# Patient Record
Sex: Male | Born: 1955
Health system: Northeastern US, Academic
[De-identification: ages and names within clinical notes are randomized; demographics above are authoritative.]

## PROBLEM LIST (undated history)

## (undated) DIAGNOSIS — K922 Gastrointestinal hemorrhage, unspecified: Secondary | ICD-10-CM

## (undated) DIAGNOSIS — R1084 Generalized abdominal pain: Secondary | ICD-10-CM

## (undated) DIAGNOSIS — K920 Hematemesis: Secondary | ICD-10-CM

## (undated) DIAGNOSIS — G629 Polyneuropathy, unspecified: Secondary | ICD-10-CM

## (undated) DIAGNOSIS — R42 Dizziness and giddiness: Secondary | ICD-10-CM

## (undated) DIAGNOSIS — I10 Essential (primary) hypertension: Secondary | ICD-10-CM

## (undated) DIAGNOSIS — K7460 Unspecified cirrhosis of liver: Secondary | ICD-10-CM

## (undated) DIAGNOSIS — R188 Other ascites: Secondary | ICD-10-CM

## (undated) DIAGNOSIS — K219 Gastro-esophageal reflux disease without esophagitis: Secondary | ICD-10-CM

## (undated) DIAGNOSIS — B1920 Unspecified viral hepatitis C without hepatic coma: Secondary | ICD-10-CM

## (undated) DIAGNOSIS — K652 Spontaneous bacterial peritonitis: Secondary | ICD-10-CM

## (undated) DIAGNOSIS — M4800 Spinal stenosis, site unspecified: Secondary | ICD-10-CM

## (undated) HISTORY — DX: Spontaneous bacterial peritonitis: K65.2

## (undated) HISTORY — PX: HAND SURGERY: SHX662

## (undated) HISTORY — DX: Hematemesis: K92.0

## (undated) HISTORY — PX: BACK SURGERY: SHX140

## (undated) HISTORY — DX: Gastrointestinal hemorrhage, unspecified: K92.2

## (undated) HISTORY — DX: Dizziness and giddiness: R42

## (undated) HISTORY — DX: Other ascites: R18.8

## (undated) HISTORY — PX: PARACENTESIS: SHX844

## (undated) HISTORY — DX: Generalized abdominal pain: R10.84

---

## 2016-08-18 ENCOUNTER — Emergency Department
Admission: EM | Admit: 2016-08-18 | Discharge: 2016-08-18 | Disposition: A | Discharge status: Home / Self Care | Payer: Self-pay | Attending: Emergency Medicine | Admitting: Emergency Medicine

## 2016-08-18 ENCOUNTER — Encounter: Payer: Self-pay | Admitting: Emergency Medicine

## 2016-08-18 ENCOUNTER — Emergency Department: Payer: Self-pay

## 2016-08-18 DIAGNOSIS — K7460 Unspecified cirrhosis of liver: Secondary | ICD-10-CM

## 2016-08-18 DIAGNOSIS — R188 Other ascites: Secondary | ICD-10-CM

## 2016-08-18 DIAGNOSIS — K7031 Alcoholic cirrhosis of liver with ascites: Secondary | ICD-10-CM | POA: Insufficient documentation

## 2016-08-18 DIAGNOSIS — Z87891 Personal history of nicotine dependence: Secondary | ICD-10-CM | POA: Insufficient documentation

## 2016-08-18 HISTORY — DX: Gastro-esophageal reflux disease without esophagitis: K21.9

## 2016-08-18 HISTORY — DX: Polyneuropathy, unspecified: G62.9

## 2016-08-18 HISTORY — DX: Spinal stenosis, site unspecified: M48.00

## 2016-08-18 LAB — URINALYSIS COMPLETE WITH MICROSCOPIC (ARMC ONLY)
BACTERIA UA: NONE SEEN
Glucose, UA: NEGATIVE mg/dL
HGB URINE DIPSTICK: NEGATIVE
Leukocytes, UA: NEGATIVE
NITRITE: NEGATIVE
PH: 6 (ref 5.0–8.0)
PROTEIN: 30 mg/dL — AB
RBC / HPF: NONE SEEN RBC/hpf (ref 0–5)
SPECIFIC GRAVITY, URINE: 1.017 (ref 1.005–1.030)

## 2016-08-18 LAB — HEPATIC FUNCTION PANEL
ALK PHOS: 75 U/L (ref 38–126)
ALT: 59 U/L (ref 17–63)
AST: 177 U/L — ABNORMAL HIGH (ref 15–41)
Albumin: 3.2 g/dL — ABNORMAL LOW (ref 3.5–5.0)
BILIRUBIN DIRECT: 1 mg/dL — AB (ref 0.1–0.5)
BILIRUBIN INDIRECT: 1.4 mg/dL — AB (ref 0.3–0.9)
TOTAL PROTEIN: 7.2 g/dL (ref 6.5–8.1)
Total Bilirubin: 2.4 mg/dL — ABNORMAL HIGH (ref 0.3–1.2)

## 2016-08-18 LAB — BASIC METABOLIC PANEL
Anion gap: 10 (ref 5–15)
CALCIUM: 7.8 mg/dL — AB (ref 8.9–10.3)
CO2: 23 mmol/L (ref 22–32)
CREATININE: 0.59 mg/dL — AB (ref 0.61–1.24)
Chloride: 105 mmol/L (ref 101–111)
GFR calc non Af Amer: >60 mL/min (ref >60)
Glucose, Bld: 117 mg/dL — ABNORMAL HIGH (ref 65–99)
Potassium: 3.8 mmol/L (ref 3.5–5.1)
SODIUM: 138 mmol/L (ref 135–145)

## 2016-08-18 LAB — CBC WITH DIFFERENTIAL/PLATELET
BASOS ABS: 0 10*3/uL (ref 0–0.1)
BASOS PCT: 0 %
EOS ABS: 0.1 10*3/uL (ref 0–0.7)
EOS PCT: 2 %
HCT: 41.8 % (ref 40.0–52.0)
Hemoglobin: 14.2 g/dL (ref 13.0–18.0)
Lymphocytes Relative: 35 %
Lymphs Abs: 1.1 10*3/uL (ref 1.0–3.6)
MCH: 34.2 pg — ABNORMAL HIGH (ref 26.0–34.0)
MCHC: 34 g/dL (ref 32.0–36.0)
MCV: 100.5 fL — ABNORMAL HIGH (ref 80.0–100.0)
MONO ABS: 0.3 10*3/uL (ref 0.2–1.0)
Monocytes Relative: 11 %
NEUTROS ABS: 1.6 10*3/uL (ref 1.4–6.5)
Neutrophils Relative %: 52 %
PLATELETS: 71 10*3/uL — AB (ref 150–440)
RBC: 4.16 MIL/uL — ABNORMAL LOW (ref 4.40–5.90)
RDW: 14.4 % (ref 11.5–14.5)
WBC: 3.1 10*3/uL — ABNORMAL LOW (ref 3.8–10.6)

## 2016-08-18 LAB — TROPONIN I

## 2016-08-18 LAB — LIPASE, BLOOD: LIPASE: 44 U/L (ref 11–51)

## 2016-08-18 MED ORDER — IOPAMIDOL (ISOVUE-300) INJECTION 61%
100.0000 mL | Freq: Once | INTRAVENOUS | Status: AC | PRN
  Administered 2016-08-18: 100 mL via INTRAVENOUS

## 2016-08-18 MED ORDER — MORPHINE SULFATE (PF) 2 MG/ML IV SOLN
4.0000 mg | Freq: Once | INTRAVENOUS | Status: AC
  Administered 2016-08-18: 4 mg via INTRAVENOUS
  Filled 2016-08-18: qty 2

## 2016-08-18 MED ORDER — ONDANSETRON HCL 4 MG/2ML IJ SOLN
4.0000 mg | Freq: Once | INTRAMUSCULAR | Status: AC
  Administered 2016-08-18: 4 mg via INTRAVENOUS
  Filled 2016-08-18: qty 2

## 2016-08-18 MED ORDER — IOPAMIDOL (ISOVUE-300) INJECTION 61%
30.0000 mL | Freq: Once | INTRAVENOUS | Status: AC | PRN
  Administered 2016-08-18: 30 mL via ORAL

## 2016-08-18 NOTE — ED Provider Notes (Signed)
[facility] Emergency Department Provider Note  Time seen: 4:36 PM  I have reviewed the triage vital signs and the nursing notes.   HISTORY  Chief Complaint Bloated and Flank Pain    HPI Antonio Ryan is a 60 y.o. male witha past medical history of gastric reflux, spinal stenosis, who presents the emergency department with abdominal swelling and pain. According to the patient for the past one week he has been experiencing abdominal swelling. He states the abdomen feels very tight and uncomfortable. States nausea but denies vomiting. States a smaller than normal bowel movement over the past several days including this morning. Denies diarrhea. Denies fever. Patient does admit to daily alcohol use 4-5 glasses of wine per day. Denies any issues with his liver in the past.  Past Medical History:  Diagnosis Date  . Acid reflux   . Neuropathy (HCC)   . Spinal stenosis     There are no active problems to display for this patient.   Past Surgical History:  Procedure Laterality Date  . BACK SURGERY      Prior to Admission medications   Not on File    No Known Allergies  No family history on file.  Social History Social History  Substance Use Topics  . Smoking status: Former Games developermoker  . Smokeless tobacco: Never Used  . Alcohol use 2.4 oz/week    4 Glasses of wine per week    Review of Systems Constitutional: Negative for fever Cardiovascular: Negative for chest pain. Respiratory: Negative for shortness of breath. Gastrointestinal: Positive for abdominal discomfort, mild. Positive for abdominal swelling. Positive for nausea. Negative for vomiting or diarrhea. Genitourinary: Negative for dysuria. Neurological: Negative for headache 10-point ROS otherwise negative.  ____________________________________________   PHYSICAL EXAM:  VITAL SIGNS: ED Triage Vitals [08/18/16 1348]  Enc Vitals Group     BP (!) 157/80     Pulse Rate (!) 103   Resp 18     Temp 97.9 F (36.6 C)     Temp Source Oral     SpO2 94 %     Weight 230 lb (104.3 kg)     Height 6\' 1"  (1.854 m)     Head Circumference      Peak Flow      Pain Score 10     Pain Loc      Pain Edu?      Excl. in GC?     Constitutional: Alert and oriented. Well appearing and in no distress. Eyes: Slight scleral icterus. ENT   Head: Normocephalic and atraumatic.   Mouth/Throat: Mucous membranes are moist. Cardiovascular: Normal rate, regular rhythm. No murmur Respiratory: Normal respiratory effort without tachypnea nor retractions. Breath sounds are clear  Gastrointestinal: Soft, but distended. Mostly dull percussion. Mild diffuse abdominal tenderness, no rebound or guarding. No signs of peritonitis. Musculoskeletal: Nontender with normal range of motion in all extremities. Mild to moderate pedal edema bilaterally. Neurologic:  Normal speech and language. No gross focal neurologic deficits  Skin:  Skin is warm, dry and intact.  Psychiatric: Mood and affect are normal. Speech and behavior are normal.       RADIOLOGY  CT shows hepatic cirrhosis, large amount of ascites.  ____________________________________________   INITIAL IMPRESSION / ASSESSMENT AND PLAN / ED COURSE  Pertinent labs & imaging results that were available during my care of the patient were reviewed by me and considered in my medical decision making (see chart for details).  The patient presents the emergency  department with 1 week of abdominal swelling and discomfort. Patient does admit to daily alcohol use. Percussion is mostly dull raising concern for possible liver dysfunction due to alcohol use. Patient does state abdominal discomfort mostly on the right side. We'll obtain a CT scan of the abdomen. I added on liver function tests to further evaluate. Patient is agreeable to this plan.  CT shows hepatic cirrhosis with large amount of ascites. Liver function tests show mild hepatic  dysfunction with an elevated bilirubin. I discussed with the patient the need to follow up with a primary care doctor as soon as possible to discuss further workup including hepatology. I discussed the importance of stopping alcohol intake. Patient is agreeable.  ____________________________________________   FINAL CLINICAL IMPRESSION(S) / ED DIAGNOSES  Abdominal swelling. Abdominal pain. Liver dysfunction   Minna AntisKevin Jalaila Caradonna, MD 08/18/16 1901

## 2016-08-18 NOTE — ED Triage Notes (Signed)
Patient presents to ED via POV with c/o right flank pain and abdominal distention. Patient also c/o nausea but states he has been able to eat just fine. LBM yesterday. Patient states urine is dark. Abdomen is very distended, patient states he drinks 4 glasses of wine a day. Patient eyes are jaundiced.

## 2016-08-18 NOTE — Discharge Instructions (Signed)
As we have discussed your workup today shows a large amount of ascites which is concerning for liver dysfunction. It is very important he follow-up with her primary care doctor this week to discuss further workup. It is also extremely important that you stop using alcohol. Return to the emergency department for any worsening pain, fever, or any other personally concerning symptoms.

## 2016-09-09 ENCOUNTER — Emergency Department
Admission: EM | Admit: 2016-09-09 | Discharge: 2016-09-09 | Disposition: A | Discharge status: Home / Self Care | Payer: Medicaid Other | Attending: Emergency Medicine | Admitting: Emergency Medicine

## 2016-09-09 DIAGNOSIS — Z87891 Personal history of nicotine dependence: Secondary | ICD-10-CM | POA: Insufficient documentation

## 2016-09-09 DIAGNOSIS — R140 Abdominal distension (gaseous): Secondary | ICD-10-CM

## 2016-09-09 DIAGNOSIS — Z79899 Other long term (current) drug therapy: Secondary | ICD-10-CM | POA: Diagnosis not present

## 2016-09-09 DIAGNOSIS — K7031 Alcoholic cirrhosis of liver with ascites: Secondary | ICD-10-CM | POA: Insufficient documentation

## 2016-09-09 DIAGNOSIS — Z791 Long term (current) use of non-steroidal anti-inflammatories (NSAID): Secondary | ICD-10-CM | POA: Diagnosis not present

## 2016-09-09 LAB — AMYLASE: AMYLASE: 34 U/L (ref 28–100)

## 2016-09-09 LAB — COMPREHENSIVE METABOLIC PANEL
ALBUMIN: 3.4 g/dL — AB (ref 3.5–5.0)
ALT: 44 U/L (ref 17–63)
ANION GAP: 7 (ref 5–15)
AST: 84 U/L — ABNORMAL HIGH (ref 15–41)
Alkaline Phosphatase: 59 U/L (ref 38–126)
BUN: 8 mg/dL (ref 6–20)
CHLORIDE: 102 mmol/L (ref 101–111)
CO2: 26 mmol/L (ref 22–32)
Calcium: 8.6 mg/dL — ABNORMAL LOW (ref 8.9–10.3)
Creatinine, Ser: 0.79 mg/dL (ref 0.61–1.24)
GFR calc non Af Amer: >60 mL/min (ref >60)
GLUCOSE: 106 mg/dL — AB (ref 65–99)
Potassium: 4.6 mmol/L (ref 3.5–5.1)
SODIUM: 135 mmol/L (ref 135–145)
Total Bilirubin: 3.7 mg/dL — ABNORMAL HIGH (ref 0.3–1.2)
Total Protein: 7.7 g/dL (ref 6.5–8.1)

## 2016-09-09 LAB — CBC
HCT: 47.3 % (ref 40.0–52.0)
HEMOGLOBIN: 15.7 g/dL (ref 13.0–18.0)
MCH: 33.5 pg (ref 26.0–34.0)
MCHC: 33.3 g/dL (ref 32.0–36.0)
MCV: 100.9 fL — AB (ref 80.0–100.0)
Platelets: 133 10*3/uL — ABNORMAL LOW (ref 150–440)
RBC: 4.69 MIL/uL (ref 4.40–5.90)
RDW: 14.5 % (ref 11.5–14.5)
WBC: 5.9 10*3/uL (ref 3.8–10.6)

## 2016-09-09 LAB — GLUCOSE, PERITONEAL FLUID: Glucose, Peritoneal Fluid: 113 mg/dL

## 2016-09-09 LAB — LACTATE DEHYDROGENASE, PLEURAL OR PERITONEAL FLUID: LD FL: 68 U/L — AB (ref 3–23)

## 2016-09-09 LAB — PROTEIN, BODY FLUID: Total protein, fluid: <3 g/dL

## 2016-09-09 LAB — SPECIFIC GRAVITY: SPECIFIC GRAVITY: 1.015

## 2016-09-09 LAB — LIPASE, BLOOD: LIPASE: 43 U/L (ref 11–51)

## 2016-09-09 MED ORDER — LIDOCAINE-EPINEPHRINE (PF) 1 %-1:200000 IJ SOLN
INTRAMUSCULAR | Status: AC
  Administered 2016-09-09: 30 mL via INTRADERMAL
  Filled 2016-09-09: qty 30

## 2016-09-09 MED ORDER — LIDOCAINE-EPINEPHRINE (PF) 1 %-1:200000 IJ SOLN
30.0000 mL | Freq: Once | INTRAMUSCULAR | Status: AC
  Administered 2016-09-09: 30 mL via INTRADERMAL

## 2016-09-09 MED ORDER — ONDANSETRON HCL 4 MG/2ML IJ SOLN
INTRAMUSCULAR | Status: AC
  Filled 2016-09-09: qty 2

## 2016-09-09 MED ORDER — CEPHALEXIN 500 MG PO CAPS: 500.0000 mg | ORAL_CAPSULE | Freq: Three times a day (TID) | ORAL | 0 refills | Status: DC

## 2016-09-09 NOTE — ED Triage Notes (Signed)
Pt states that he was recently diagnosed with cirrhosis, pt states that he was told to return if the swelling in his abd got worse, pt reports that his swelling is worse in his abd

## 2016-09-09 NOTE — ED Provider Notes (Signed)
[facility] Emergency Department Provider Note  Time seen: 6:17 PM  I have reviewed the triage vital signs and the nursing notes.   HISTORY  Chief Complaint Abdominal Pain    HPI Antonio BrownsDonald Manter is a 60 y.o. male with a past medical history of alcohol abuse, who presents the emergency department with significant abdominal distention. 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 Patient states he called GI medicine to arrange a follow-up appointment but the earliest appointment is next month in December. He states over the past 2 weeks his abdomen has continued to swell. He states it is so tight that it has become painful, hard for him to breathe. Denies any fever. Denies any tenderness on pushing on his abdomen. Patient states it feels extremely tight and he has not been able to sleep due to the discomfort.  Past Medical History:  Diagnosis Date  . Acid reflux   . Neuropathy (HCC)   . Spinal stenosis     There are no active problems to display for this patient.   Past Surgical History:  Procedure Laterality Date  . BACK SURGERY      Prior to Admission medications   Not on File    No Known Allergies  No family history on file.  Social History Social History  Substance Use Topics  . Smoking status: Former Games developermoker  . Smokeless tobacco: Never Used  . Alcohol use 2.4 oz/week    4 Glasses of wine per week    Review of Systems Constitutional: Negative for fever Cardiovascular: Negative for chest pain. Respiratory: Negative for shortness of breath. Gastrointestinal: Positive for abdominal swelling and tightness. Genitourinary: Negative for dysuria. Musculoskeletal: Negative for back pain. Neurological: Negative for headache 10-point ROS otherwise  negative.  ____________________________________________   PHYSICAL EXAM:  VITAL SIGNS: ED Triage Vitals  Enc Vitals Group     BP 09/09/16 1713 (!) 158/89     Pulse Rate 09/09/16 1713 97     Resp 09/09/16 1713 18     Temp 09/09/16 1713 97.6 F (36.4 C)     Temp Source 09/09/16 1713 Oral     SpO2 09/09/16 1713 98 %     Weight 09/09/16 1714 231 lb 11.2 oz (105.1 kg)     Height 09/09/16 1714 6\' 1"  (1.854 m)     Head Circumference --      Peak Flow --      Pain Score --      Pain Loc --      Pain Edu? --      Excl. in GC? --     Constitutional: Alert and oriented. Well appearing and in no distress. Eyes: Normal exam ENT   Head: Normocephalic and atraumatic   Mouth/Throat: Mucous membranes are moist. Cardiovascular: Normal rate, regular rhythm. No murmur Respiratory: Normal respiratory effort without tachypnea nor retractions. Breath sounds are clear Gastrointestinal: Significantly distended abdomen. Nontender to palpation. Dull percussion. Musculoskeletal: Nontender with normal range of motion in all extremities.  Neurologic:  Normal speech and language. No gross focal neurologic deficits Skin:  Skin is warm, dry and intact.  Psychiatric: Mood and affect are normal.   ____________________________________________   INITIAL IMPRESSION / ASSESSMENT AND PLAN / ED COURSE  Pertinent labs & imaging results that were available during my care of the patient were reviewed by me and considered in my medical  decision making (see chart for details).  Patient presents the emergency department with a significantly distended abdomen hoping to have fluid drained. Patient has never had fluid drained from his abdomen in the past. It has become so tight it is uncomfortable for him, he is having trouble breathing, states he cannot sleep due to the abdominal tightness. No interventional radiology currently as it is a Sunday evening available. We will attempt therapeutic paracentesis in the  emergency department. Central Venous Catheter Insertion Procedure Note Antonio BrownsDonald Ryan [id number] 04/21/1956  Procedure: Paracentesis Indications: Abdominal distention  Procedure Details Consent: Risks of procedure as well as the alternatives and risks of each were explained to the (patient/caregiver).  Consent for procedure obtained.  Time Out: Verified patient identification, verified procedure, site/side was marked, verified correct patient position, special equipment/implants available, medications/allergies/relevent history reviewed, required imaging and test results available.  Performed  Maximum sterile technique was used including antiseptics, gown, gloves, sterile covers. Skin prep: Chlorhexidine; local anesthetic administered Local lidocaine with epinephrine (1%)  Evaluation Yellow fluid drained. Catheter inserted for drainage.  Total of 2700mL drained.  Complications: No apparent complications Patient did tolerate procedure well.  Minna AntisDUCHOWSKI, Hadli Vandemark 09/09/2016, 8:56 PM   Paracentesis performed by myself. 2700 cc of yellow ascitic fluid removed. Labs have been sent on the fluid. Patient tolerated the procedure very well. No complications. Patient will be discharged home. As a secondary complaint the patient also has an area of tenderness or redness in front of his left ear. No abscess, but likely cellulitis versus early abscess. Nothing to drain at this time. We'll discharge him a short course of Keflex. Patient agreeable to plan. Patient will follow up with GI medicine in approximately 2 weeks. __________________________________________   FINAL CLINICAL IMPRESSION(S) / ED DIAGNOSES  ascities cellulitis    Minna AntisKevin Annabella Elford, MD 09/09/16 2100

## 2016-09-09 NOTE — ED Notes (Addendum)
Initial return of 30 mls

## 2016-09-09 NOTE — Discharge Instructions (Signed)
A paracentesis has been performed in the emergency department today (fluid drained from your abdomen). 2700 mL's of fluid has been removed. He'll be prescribed an antibiotic for your left ear cellulitis/infection. Please take her to entire course. Please follow-up with GI medicine as soon as possible. Return to the emergency department for any abdominal pain, fever, or any other symptom personally concerning to yourself.

## 2016-09-09 NOTE — ED Notes (Signed)
Procedure time out att

## 2016-09-10 NOTE — ED Provider Notes (Signed)
Mildly elevated LDH noted by nurse on lab review. Patient without ttp in note, normal WBC. No further work up indicated at this time   Jene Everyobert Matai Carpenito, MD 09/10/16 1137

## 2016-09-11 LAB — MISC LABCORP TEST (SEND OUT): Labcorp test code: 19588

## 2016-09-13 LAB — BODY FLUID CULTURE
Culture: NO GROWTH
GRAM STAIN: NONE SEEN

## 2016-09-26 ENCOUNTER — Encounter: Payer: Self-pay | Admitting: Emergency Medicine

## 2016-09-26 ENCOUNTER — Inpatient Hospital Stay
Admission: EM | Admit: 2016-09-26 | Discharge: 2016-10-03 | DRG: 432 | Disposition: A | Discharge status: Home / Self Care | Payer: Medicaid Other | Attending: Internal Medicine | Admitting: Internal Medicine

## 2016-09-26 DIAGNOSIS — K922 Gastrointestinal hemorrhage, unspecified: Secondary | ICD-10-CM | POA: Diagnosis present

## 2016-09-26 DIAGNOSIS — D72819 Decreased white blood cell count, unspecified: Secondary | ICD-10-CM | POA: Diagnosis not present

## 2016-09-26 DIAGNOSIS — R109 Unspecified abdominal pain: Secondary | ICD-10-CM

## 2016-09-26 DIAGNOSIS — Z23 Encounter for immunization: Secondary | ICD-10-CM

## 2016-09-26 DIAGNOSIS — K652 Spontaneous bacterial peritonitis: Secondary | ICD-10-CM

## 2016-09-26 DIAGNOSIS — K7031 Alcoholic cirrhosis of liver with ascites: Principal | ICD-10-CM

## 2016-09-26 DIAGNOSIS — M4800 Spinal stenosis, site unspecified: Secondary | ICD-10-CM | POA: Diagnosis present

## 2016-09-26 DIAGNOSIS — Z79899 Other long term (current) drug therapy: Secondary | ICD-10-CM

## 2016-09-26 DIAGNOSIS — G629 Polyneuropathy, unspecified: Secondary | ICD-10-CM | POA: Diagnosis present

## 2016-09-26 DIAGNOSIS — R1084 Generalized abdominal pain: Secondary | ICD-10-CM

## 2016-09-26 DIAGNOSIS — D696 Thrombocytopenia, unspecified: Secondary | ICD-10-CM | POA: Diagnosis present

## 2016-09-26 DIAGNOSIS — K219 Gastro-esophageal reflux disease without esophagitis: Secondary | ICD-10-CM | POA: Diagnosis present

## 2016-09-26 DIAGNOSIS — I10 Essential (primary) hypertension: Secondary | ICD-10-CM | POA: Diagnosis present

## 2016-09-26 DIAGNOSIS — Z87891 Personal history of nicotine dependence: Secondary | ICD-10-CM

## 2016-09-26 DIAGNOSIS — B182 Chronic viral hepatitis C: Secondary | ICD-10-CM | POA: Diagnosis present

## 2016-09-26 DIAGNOSIS — D684 Acquired coagulation factor deficiency: Secondary | ICD-10-CM | POA: Diagnosis present

## 2016-09-26 DIAGNOSIS — F1020 Alcohol dependence, uncomplicated: Secondary | ICD-10-CM | POA: Diagnosis present

## 2016-09-26 DIAGNOSIS — M6281 Muscle weakness (generalized): Secondary | ICD-10-CM

## 2016-09-26 DIAGNOSIS — K920 Hematemesis: Secondary | ICD-10-CM | POA: Diagnosis present

## 2016-09-26 DIAGNOSIS — K5900 Constipation, unspecified: Secondary | ICD-10-CM | POA: Diagnosis not present

## 2016-09-26 DIAGNOSIS — R609 Edema, unspecified: Secondary | ICD-10-CM

## 2016-09-26 HISTORY — DX: Essential (primary) hypertension: I10

## 2016-09-26 HISTORY — DX: Unspecified viral hepatitis C without hepatic coma: B19.20

## 2016-09-26 HISTORY — DX: Unspecified cirrhosis of liver: K74.60

## 2016-09-26 LAB — CBC
HCT: 42.2 % (ref 40.0–52.0)
HEMOGLOBIN: 14.3 g/dL (ref 13.0–18.0)
MCH: 33.6 pg (ref 26.0–34.0)
MCHC: 34 g/dL (ref 32.0–36.0)
MCV: 98.9 fL (ref 80.0–100.0)
Platelets: 127 10*3/uL — ABNORMAL LOW (ref 150–440)
RBC: 4.27 MIL/uL — ABNORMAL LOW (ref 4.40–5.90)
RDW: 15.2 % — ABNORMAL HIGH (ref 11.5–14.5)
WBC: 5.5 10*3/uL (ref 3.8–10.6)

## 2016-09-26 MED ORDER — PANTOPRAZOLE SODIUM 40 MG IV SOLR
80.0000 mg | INTRAVENOUS | Status: AC
  Administered 2016-09-27: 80 mg via INTRAVENOUS
  Filled 2016-09-26: qty 80

## 2016-09-26 MED ORDER — ONDANSETRON HCL 4 MG/2ML IJ SOLN
INTRAMUSCULAR | Status: AC
  Administered 2016-09-26: 4 mg via INTRAVENOUS
  Filled 2016-09-26: qty 2

## 2016-09-26 MED ORDER — MORPHINE SULFATE (PF) 4 MG/ML IV SOLN
4.0000 mg | Freq: Once | INTRAVENOUS | Status: AC
  Administered 2016-09-26: 4 mg via INTRAVENOUS

## 2016-09-26 MED ORDER — ONDANSETRON HCL 4 MG/2ML IJ SOLN
4.0000 mg | Freq: Once | INTRAMUSCULAR | Status: AC
  Administered 2016-09-26: 4 mg via INTRAVENOUS

## 2016-09-26 MED ORDER — MORPHINE SULFATE (PF) 4 MG/ML IV SOLN
INTRAVENOUS | Status: AC
  Administered 2016-09-26: 4 mg via INTRAVENOUS
  Filled 2016-09-26: qty 1

## 2016-09-26 NOTE — ED Provider Notes (Signed)
[facility] Emergency Department Provider Note   First MD Initiated Contact with Patient 09/26/16 2338     (approximate)  I have reviewed the triage vital signs and the nursing notes.   HISTORY  Chief Complaint Abdominal Pain and Nausea    HPI Antonio Ryan is a 60 y.o. male with bullous chronic medical conditions including cirrhosis hepatitis C and alcohol abuse presents to the emergency department with generalized abdominal pain and distention that has progressively worsened since his visit to the emergency department on 09/10/2016 at which point a past and this was performed with 2700 ML's of yellow ascitic fluid obtained. Patient denies any fever but does however admit to vomiting bright red blood 2 times this week most recently yesterday. Patient denies any rectal bleeding or melena. Last EtOH ingestion tonight approximately 3 glasses of wine   Past Medical History:  Diagnosis Date  . Acid reflux   . Cirrhosis (HCC)   . Hepatitis C   . Neuropathy (HCC)   . Spinal stenosis     There are no active problems to display for this patient.   Past Surgical History:  Procedure Laterality Date  . BACK SURGERY      Prior to Admission medications   Medication Sig Start Date End Date Taking? Authorizing Provider  cephALEXin (KEFLEX) 500 MG capsule Take 1 capsule (500 mg total) by mouth 3 (three) times daily. 09/09/16   Minna AntisKevin Paduchowski, MD  ibuprofen (ADVIL,MOTRIN) 200 MG tablet Take 400-600 mg by mouth every 6 (six) hours as needed.    Historical Provider, MD  lisinopril (PRINIVIL,ZESTRIL) 10 MG tablet Take 10 mg by mouth daily.    Historical Provider, MD  omeprazole (PRILOSEC) 20 MG capsule Take 20 mg by mouth daily.    Historical Provider, MD    Allergies No known drug allergies History reviewed. No pertinent family history.  Social History Social History  Substance Use Topics  . Smoking status: Former Games developermoker  . Smokeless tobacco: Never  Used  . Alcohol use 2.4 oz/week    4 Glasses of wine per week    Review of Systems Constitutional: No fever/chills Eyes: No visual changes. ENT: No sore throat. Cardiovascular: Denies chest pain. Respiratory: Denies shortness of breath. Gastrointestinal:Positive for abdominal pain and distention  No nausea, no vomiting.  No diarrhea.  No constipation. Genitourinary: Negative for dysuria. Musculoskeletal: Negative for back pain. Skin: Negative for rash. Neurological: Negative for headaches, focal weakness or numbness.  10-point ROS otherwise negative.  ____________________________________________   PHYSICAL EXAM:  VITAL SIGNS: ED Triage Vitals  Enc Vitals Group     BP 09/26/16 2330 (!) 166/92     Pulse Rate 09/26/16 2330 88     Resp 09/26/16 2330 (!) 21     Temp 09/26/16 2330 98.7 F (37.1 C)     Temp Source 09/26/16 2330 Oral     SpO2 09/26/16 2330 98 %     Weight 09/26/16 2331 230 lb (104.3 kg)     Height 09/26/16 2331 6\' 1"  (1.854 m)     Head Circumference --      Peak Flow --      Pain Score 09/26/16 2331 10     Pain Loc --      Pain Edu? --      Excl. in GC? --    Constitutional: Alert and oriented. Apparent discomfort Eyes: Conjunctivae are normal. PERRL. EOMI. Head: Atraumatic. Ears:  Healthy appearing ear canals and TMs bilaterally Nose: No congestion/rhinnorhea.  Mouth/Throat: Mucous membranes are moist.  Oropharynx non-erythematous. Neck: No stridor.  No meningeal signs.   Cardiovascular: Normal rate, regular rhythm. Good peripheral circulation. Grossly normal heart sounds. Respiratory: Normal respiratory effort.  No retractions. Left lower lung field rales. Gastrointestinal: He was abdominal tenderness palpation markedly distended abdomen positive fluid wave Musculoskeletal: No lower extremity tenderness nor edema. No gross deformities of extremities. Neurologic:  Normal speech and language. No gross focal neurologic deficits are appreciated.  Skin:   Skin is warm, dry and intact. No rash noted. Psychiatric: Mood and affect are normal. Speech and behavior are normal.  ____________________________________________   LABS (all labs ordered are listed, but only abnormal results are displayed)  Labs Reviewed  LIPASE, BLOOD  COMPREHENSIVE METABOLIC PANEL  CBC  URINALYSIS, COMPLETE (UACMP) WITH MICROSCOPIC    RADIOLOGY  I, [place] N BROWN, personally viewed and evaluated these images (plain radiographs) as part of my medical decision making, as well as reviewing the written report by the radiologist.  No results found.  ___  Procedures      INITIAL IMPRESSION / ASSESSMENT AND PLAN / ED COURSE  Pertinent labs & imaging results that were available during my care of the patient were reviewed by me and considered in my medical decision making (see chart for details).   Given Protonix bolus 80 mg as well as Protonix infusion follow secondary to upper GI bleed. Patient discussed with Dr. Emmit PomfretHugelmeyer oscillation for further evaluation and management.  Clinical Course     ____________________________________________  FINAL CLINICAL IMPRESSION(S) / ED DIAGNOSES  Final diagnoses:  Upper GI bleed  Ascites due to alcoholic cirrhosis (HCC)     MEDICATIONS GIVEN DURING THIS VISIT:  Medications  ondansetron (ZOFRAN) injection 4 mg (4 mg Intravenous Given 09/26/16 2343)  morphine 4 MG/ML injection 4 mg (4 mg Intravenous Given 09/26/16 2343)     NEW OUTPATIENT MEDICATIONS STARTED DURING THIS VISIT:  New Prescriptions   No medications on file    Modified Medications   No medications on file    Discontinued Medications   No medications on file     Note:  This document was prepared using Dragon voice recognition software and may include unintentional dictation errors.    Darci Currentandolph N Brown, MD 09/27/16 [phone]

## 2016-09-26 NOTE — ED Triage Notes (Signed)
[facility] home c/o RUQ abd pain with distention. Pt also reports nausea and one episode of vomiting today. Pt reports that he was seen here in this ED about a month ago and had paracentesis done and had quite a bit of fluid removed from his abd. Pt has a history of hep C and was recently diagnosed with cirrhosis. Abd is distended and taught on assessment.

## 2016-09-26 NOTE — ED Notes (Signed)
Wife reports that pt was throwing up blood on Monday and pt reports that he was throwing up blood yesterday. Pt denies any frank blood in his stool or any dark or black stools.

## 2016-09-27 ENCOUNTER — Emergency Department: Payer: Medicaid Other

## 2016-09-27 ENCOUNTER — Inpatient Hospital Stay: Payer: Medicaid Other

## 2016-09-27 ENCOUNTER — Encounter: Payer: Self-pay | Admitting: Internal Medicine

## 2016-09-27 DIAGNOSIS — K920 Hematemesis: Secondary | ICD-10-CM | POA: Diagnosis present

## 2016-09-27 DIAGNOSIS — M4800 Spinal stenosis, site unspecified: Secondary | ICD-10-CM | POA: Diagnosis present

## 2016-09-27 DIAGNOSIS — Z79899 Other long term (current) drug therapy: Secondary | ICD-10-CM | POA: Diagnosis not present

## 2016-09-27 DIAGNOSIS — D696 Thrombocytopenia, unspecified: Secondary | ICD-10-CM | POA: Diagnosis present

## 2016-09-27 DIAGNOSIS — F1020 Alcohol dependence, uncomplicated: Secondary | ICD-10-CM | POA: Diagnosis present

## 2016-09-27 DIAGNOSIS — D684 Acquired coagulation factor deficiency: Secondary | ICD-10-CM | POA: Diagnosis present

## 2016-09-27 DIAGNOSIS — Z87891 Personal history of nicotine dependence: Secondary | ICD-10-CM | POA: Diagnosis not present

## 2016-09-27 DIAGNOSIS — K219 Gastro-esophageal reflux disease without esophagitis: Secondary | ICD-10-CM | POA: Diagnosis present

## 2016-09-27 DIAGNOSIS — D72819 Decreased white blood cell count, unspecified: Secondary | ICD-10-CM | POA: Diagnosis not present

## 2016-09-27 DIAGNOSIS — I10 Essential (primary) hypertension: Secondary | ICD-10-CM | POA: Diagnosis present

## 2016-09-27 DIAGNOSIS — K652 Spontaneous bacterial peritonitis: Secondary | ICD-10-CM | POA: Diagnosis present

## 2016-09-27 DIAGNOSIS — K922 Gastrointestinal hemorrhage, unspecified: Secondary | ICD-10-CM | POA: Diagnosis present

## 2016-09-27 DIAGNOSIS — Z23 Encounter for immunization: Secondary | ICD-10-CM | POA: Diagnosis not present

## 2016-09-27 DIAGNOSIS — K7031 Alcoholic cirrhosis of liver with ascites: Secondary | ICD-10-CM | POA: Diagnosis present

## 2016-09-27 DIAGNOSIS — K5900 Constipation, unspecified: Secondary | ICD-10-CM | POA: Diagnosis not present

## 2016-09-27 DIAGNOSIS — B182 Chronic viral hepatitis C: Secondary | ICD-10-CM | POA: Diagnosis present

## 2016-09-27 DIAGNOSIS — G629 Polyneuropathy, unspecified: Secondary | ICD-10-CM | POA: Diagnosis present

## 2016-09-27 DIAGNOSIS — R109 Unspecified abdominal pain: Secondary | ICD-10-CM | POA: Diagnosis present

## 2016-09-27 HISTORY — DX: Hematemesis: K92.0

## 2016-09-27 HISTORY — DX: Gastrointestinal hemorrhage, unspecified: K92.2

## 2016-09-27 LAB — BASIC METABOLIC PANEL
ANION GAP: 8 (ref 5–15)
BUN: 7 mg/dL (ref 6–20)
CO2: 23 mmol/L (ref 22–32)
Calcium: 7.8 mg/dL — ABNORMAL LOW (ref 8.9–10.3)
Chloride: 106 mmol/L (ref 101–111)
Creatinine, Ser: 0.56 mg/dL — ABNORMAL LOW (ref 0.61–1.24)
Glucose, Bld: 109 mg/dL — ABNORMAL HIGH (ref 65–99)
POTASSIUM: 3.8 mmol/L (ref 3.5–5.1)
SODIUM: 137 mmol/L (ref 135–145)

## 2016-09-27 LAB — COMPREHENSIVE METABOLIC PANEL
ALBUMIN: 2.9 g/dL — AB (ref 3.5–5.0)
ALK PHOS: 65 U/L (ref 38–126)
ALT: 33 U/L (ref 17–63)
AST: 85 U/L — AB (ref 15–41)
Anion gap: 9 (ref 5–15)
BUN: 6 mg/dL (ref 6–20)
CALCIUM: 8 mg/dL — AB (ref 8.9–10.3)
CHLORIDE: 103 mmol/L (ref 101–111)
CO2: 25 mmol/L (ref 22–32)
CREATININE: 0.72 mg/dL (ref 0.61–1.24)
GFR calc non Af Amer: >60 mL/min (ref >60)
GLUCOSE: 116 mg/dL — AB (ref 65–99)
Potassium: 3.5 mmol/L (ref 3.5–5.1)
SODIUM: 137 mmol/L (ref 135–145)
Total Bilirubin: 2 mg/dL — ABNORMAL HIGH (ref 0.3–1.2)
Total Protein: 7 g/dL (ref 6.5–8.1)

## 2016-09-27 LAB — HEMOGLOBIN AND HEMATOCRIT, BLOOD
HCT: 38.1 % — ABNORMAL LOW (ref 40.0–52.0)
HEMATOCRIT: 40.6 % (ref 40.0–52.0)
HEMOGLOBIN: 14 g/dL (ref 13.0–18.0)
HEMOGLOBIN: 13 g/dL (ref 13.0–18.0)

## 2016-09-27 LAB — PROTIME-INR
INR: 1.54
PROTHROMBIN TIME: 18.6 s — AB (ref 11.4–15.2)

## 2016-09-27 LAB — CBC
HCT: 39.6 % — ABNORMAL LOW (ref 40.0–52.0)
HEMOGLOBIN: 13.5 g/dL (ref 13.0–18.0)
MCH: 33.4 pg (ref 26.0–34.0)
MCHC: 34.1 g/dL (ref 32.0–36.0)
MCV: 98 fL (ref 80.0–100.0)
PLATELETS: 118 10*3/uL — AB (ref 150–440)
RBC: 4.04 MIL/uL — AB (ref 4.40–5.90)
RDW: 15.3 % — ABNORMAL HIGH (ref 11.5–14.5)
WBC: 4.1 10*3/uL (ref 3.8–10.6)

## 2016-09-27 LAB — PHOSPHORUS: PHOSPHORUS: 3.9 mg/dL (ref 2.5–4.6)

## 2016-09-27 LAB — LIPASE, BLOOD: LIPASE: 30 U/L (ref 11–51)

## 2016-09-27 LAB — MAGNESIUM: Magnesium: 1.7 mg/dL (ref 1.7–2.4)

## 2016-09-27 MED ORDER — ALBUMIN HUMAN 25 % IV SOLN
25.0000 g | Freq: Once | INTRAVENOUS | Status: AC
Start: 2016-09-27 — End: 2016-09-27
  Administered 2016-09-27: 25 g via INTRAVENOUS
  Filled 2016-09-27: qty 100

## 2016-09-27 MED ORDER — ACETAMINOPHEN 325 MG PO TABS: 650.0000 mg | ORAL_TABLET | Freq: Four times a day (QID) | ORAL | Status: DC | PRN

## 2016-09-27 MED ORDER — MORPHINE SULFATE (PF) 4 MG/ML IV SOLN
4.0000 mg | Freq: Once | INTRAVENOUS | Status: AC
  Administered 2016-09-27: 4 mg via INTRAVENOUS

## 2016-09-27 MED ORDER — MORPHINE SULFATE (PF) 4 MG/ML IV SOLN
INTRAVENOUS | Status: AC
  Administered 2016-09-27: 4 mg via INTRAVENOUS
  Filled 2016-09-27: qty 1

## 2016-09-27 MED ORDER — INFLUENZA VAC SPLIT QUAD 0.5 ML IM SUSY
0.5000 mL | PREFILLED_SYRINGE | INTRAMUSCULAR | Status: AC
  Administered 2016-09-29: 0.5 mL via INTRAMUSCULAR
  Filled 2016-09-27: qty 0.5

## 2016-09-27 MED ORDER — SODIUM CHLORIDE 0.9% FLUSH: 3.0000 mL | INTRAVENOUS | Status: DC | PRN

## 2016-09-27 MED ORDER — VITAMIN B-1 100 MG PO TABS
100.0000 mg | ORAL_TABLET | Freq: Every day | ORAL | Status: DC
  Administered 2016-09-28 – 2016-10-03 (×6): 100 mg via ORAL
  Filled 2016-09-27 (×6): qty 1

## 2016-09-27 MED ORDER — SODIUM CHLORIDE 0.9% FLUSH
3.0000 mL | Freq: Two times a day (BID) | INTRAVENOUS | Status: DC
  Administered 2016-09-27 – 2016-10-02 (×13): 3 mL via INTRAVENOUS

## 2016-09-27 MED ORDER — ONDANSETRON HCL 4 MG/2ML IJ SOLN: 4.0000 mg | Freq: Four times a day (QID) | INTRAMUSCULAR | Status: DC | PRN

## 2016-09-27 MED ORDER — ONDANSETRON HCL 4 MG PO TABS: 4.0000 mg | ORAL_TABLET | Freq: Four times a day (QID) | ORAL | Status: DC | PRN

## 2016-09-27 MED ORDER — BISACODYL 5 MG PO TBEC
5.0000 mg | DELAYED_RELEASE_TABLET | Freq: Every day | ORAL | Status: DC | PRN
  Administered 2016-09-28: 5 mg via ORAL
  Filled 2016-09-27: qty 1

## 2016-09-27 MED ORDER — SODIUM CHLORIDE 0.9 % IV SOLN: 250.0000 mL | INTRAVENOUS | Status: DC | PRN

## 2016-09-27 MED ORDER — LORAZEPAM 2 MG PO TABS: 0.0000 mg | ORAL_TABLET | Freq: Two times a day (BID) | ORAL | Status: AC

## 2016-09-27 MED ORDER — ONDANSETRON HCL 4 MG/2ML IJ SOLN
4.0000 mg | Freq: Four times a day (QID) | INTRAMUSCULAR | Status: DC | PRN
  Administered 2016-09-27 – 2016-09-29 (×3): 4 mg via INTRAVENOUS
  Filled 2016-09-27 (×3): qty 2

## 2016-09-27 MED ORDER — LORAZEPAM 2 MG PO TABS: 0.0000 mg | ORAL_TABLET | Freq: Four times a day (QID) | ORAL | Status: AC

## 2016-09-27 MED ORDER — FOLIC ACID 1 MG PO TABS
1.0000 mg | ORAL_TABLET | Freq: Every day | ORAL | Status: DC
  Administered 2016-09-28 – 2016-10-03 (×6): 1 mg via ORAL
  Filled 2016-09-27 (×6): qty 1

## 2016-09-27 MED ORDER — ADULT MULTIVITAMIN W/MINERALS CH
1.0000 | ORAL_TABLET | Freq: Every day | ORAL | Status: DC
  Administered 2016-09-28 – 2016-10-03 (×6): 1 via ORAL
  Filled 2016-09-27 (×6): qty 1

## 2016-09-27 MED ORDER — LORAZEPAM 1 MG PO TABS
1.0000 mg | ORAL_TABLET | Freq: Four times a day (QID) | ORAL | Status: AC | PRN
  Filled 2016-09-27: qty 1

## 2016-09-27 MED ORDER — PANTOPRAZOLE SODIUM 40 MG IV SOLR
40.0000 mg | Freq: Two times a day (BID) | INTRAVENOUS | Status: DC
  Administered 2016-09-27 – 2016-10-02 (×11): 40 mg via INTRAVENOUS
  Filled 2016-09-27 (×11): qty 40

## 2016-09-27 MED ORDER — SPIRONOLACTONE 100 MG PO TABS
100.0000 mg | ORAL_TABLET | Freq: Every day | ORAL | Status: DC
  Administered 2016-09-28 – 2016-10-03 (×6): 100 mg via ORAL
  Filled 2016-09-27 (×6): qty 1

## 2016-09-27 MED ORDER — FUROSEMIDE 10 MG/ML IJ SOLN
40.0000 mg | Freq: Two times a day (BID) | INTRAMUSCULAR | Status: DC
  Administered 2016-09-27 – 2016-09-29 (×5): 40 mg via INTRAVENOUS
  Filled 2016-09-27 (×5): qty 4

## 2016-09-27 MED ORDER — HYDROCODONE-ACETAMINOPHEN 5-325 MG PO TABS
1.0000 | ORAL_TABLET | ORAL | Status: DC | PRN
  Administered 2016-09-27 – 2016-09-28 (×5): 1 via ORAL
  Administered 2016-09-28: 2 via ORAL
  Administered 2016-09-28: 1 via ORAL
  Administered 2016-09-28 (×2): 2 via ORAL
  Administered 2016-09-28: 1 via ORAL
  Filled 2016-09-27 (×2): qty 1
  Filled 2016-09-27: qty 2
  Filled 2016-09-27: qty 1
  Filled 2016-09-27: qty 2
  Filled 2016-09-27: qty 1
  Filled 2016-09-27: qty 2
  Filled 2016-09-27 (×3): qty 1

## 2016-09-27 MED ORDER — SENNOSIDES-DOCUSATE SODIUM 8.6-50 MG PO TABS: 1.0000 | ORAL_TABLET | Freq: Every evening | ORAL | Status: DC | PRN

## 2016-09-27 MED ORDER — SODIUM CHLORIDE 0.9 % IV SOLN
8.0000 mg/h | INTRAVENOUS | Status: DC
  Administered 2016-09-27 (×2): 8 mg/h via INTRAVENOUS
  Filled 2016-09-27 (×2): qty 80

## 2016-09-27 MED ORDER — PANTOPRAZOLE SODIUM 40 MG IV SOLR: 40.0000 mg | Freq: Two times a day (BID) | INTRAVENOUS | Status: DC

## 2016-09-27 MED ORDER — SODIUM CHLORIDE 0.9% FLUSH: 3.0000 mL | Freq: Two times a day (BID) | INTRAVENOUS | Status: DC

## 2016-09-27 MED ORDER — FOLIC ACID 1 MG PO TABS: 1.0000 mg | ORAL_TABLET | Freq: Every day | ORAL | Status: DC

## 2016-09-27 MED ORDER — VITAMIN B-1 100 MG PO TABS: 100.0000 mg | ORAL_TABLET | Freq: Every day | ORAL | Status: DC

## 2016-09-27 MED ORDER — THIAMINE HCL 100 MG/ML IJ SOLN
100.0000 mg | Freq: Every day | INTRAMUSCULAR | Status: DC
  Filled 2016-09-27: qty 2

## 2016-09-27 MED ORDER — MAGNESIUM CITRATE PO SOLN
1.0000 | Freq: Once | ORAL | Status: AC | PRN
  Administered 2016-09-28: 1 via ORAL
  Filled 2016-09-27: qty 296

## 2016-09-27 MED ORDER — ZOLPIDEM TARTRATE 5 MG PO TABS: 5.0000 mg | ORAL_TABLET | Freq: Every evening | ORAL | Status: DC | PRN

## 2016-09-27 MED ORDER — ACETAMINOPHEN 650 MG RE SUPP: 650.0000 mg | Freq: Four times a day (QID) | RECTAL | Status: DC | PRN

## 2016-09-27 MED ORDER — LORAZEPAM 2 MG/ML IJ SOLN: 1.0000 mg | Freq: Four times a day (QID) | INTRAMUSCULAR | Status: AC | PRN

## 2016-09-27 NOTE — Progress Notes (Signed)
[facility] - [place] at [facility]                                                                                                                                                                                  Patient Demographics   Antonio Ryan, is a 60 y.o. male, DOB - 1956-09-24, ZOX:[id number]  Admit date - 09/26/2016   Admitting Physician Tonye Royalty, DO  Outpatient Primary MD for the patient is No PCP Per Patient   LOS - 0  Subjective: Patient admitted with  hemtemsis no further hematemesis She complains of abdominal distention and discomfort in the epigastric region     Review of Systems:   CONSTITUTIONAL: No documented fever. No fatigue, weakness. No weight gain, no weight loss.  EYES: No blurry or double vision.  ENT: No tinnitus. No postnasal drip. No redness of the oropharynx.  RESPIRATORY: No cough, no wheeze, no hemoptysis. No dyspnea.  CARDIOVASCULAR: No chest pain. No orthopnea. No palpitations. No syncope.  GASTROINTESTINAL: No nausea, no vomiting or diarrhea. Positive abdominal pain. Positive abdominal distention No melena or hematochezia.  GENITOURINARY: No dysuria or hematuria.  ENDOCRINE: No polyuria or nocturia. No heat or cold intolerance.  HEMATOLOGY: No anemia. No bruising. No bleeding.  INTEGUMENTARY: No rashes. No lesions.  MUSCULOSKELETAL: No arthritis. No swelling. No gout.  NEUROLOGIC: No numbness, tingling, or ataxia. No seizure-type activity.  PSYCHIATRIC: No anxiety. No insomnia. No ADD.    Vitals:   Vitals:   09/27/16 1316 09/27/16 1350 09/27/16 1411 09/27/16 1433  BP: 131/82 131/88 136/80 133/85  Pulse: 81 79 79 80  Resp: 12 12 12 12   Temp: 98.1 F (36.7 C)     TempSrc: Oral     SpO2: 96% 96% 91% 94%  Weight:      Height:        Wt Readings from Last 3 Encounters:  09/27/16 234 lb 9.6 oz (106.4 kg)  09/09/16 231 lb 11.2 oz (105.1 kg)  08/18/16 230 lb (104.3 kg)     Intake/Output Summary (Last 24  hours) at 09/27/16 1448 Last data filed at 09/27/16 1057  Gross per 24 hour  Intake               95 ml  Output              575 ml  Net             -480 ml    Physical Exam:   GENERAL: Pleasant-appearing in no apparent distress.  HEAD, EYES, EARS, NOSE AND THROAT: Atraumatic, normocephalic. Extraocular muscles are intact. Pupils equal and reactive to light. Sclerae anicteric. No conjunctival injection. No oro-pharyngeal  erythema.  NECK: Supple. There is no jugular venous distention. No bruits, no lymphadenopathy, no thyromegaly.  HEART: Regular rate and rhythm,. No murmurs, no rubs, no clicks.  LUNGS: Clear to auscultation bilaterally. No rales or rhonchi. No wheezes.  ABDOMEN: Soft, positively distended and epigastric tenderness Has good bowel sounds. No hepatosplenomegaly appreciated.  EXTREMITIES: No evidence of any cyanosis, clubbing, or peripheral edema.  +2 pedal and radial pulses bilaterally.  NEUROLOGIC: The patient is alert, awake, and oriented x3 with no focal motor or sensory deficits appreciated bilaterally.  SKIN: Moist and warm with no rashes appreciated.  Psych: Not anxious, depressed LN: No inguinal LN enlargement    Antibiotics   Anti-infectives    None      Medications   Scheduled Meds: . albumin human  25 g Intravenous Once  . folic acid  1 mg Oral Daily  . furosemide  40 mg Intravenous BID  . [START ON 09/28/2016] Influenza vac split quadrivalent PF  0.5 mL Intramuscular Tomorrow-1000  . LORazepam  0-4 mg Oral Q6H   Followed by  . [START ON 09/29/2016] LORazepam  0-4 mg Oral Q12H  . multivitamin with minerals  1 tablet Oral Daily  . [START ON 09/30/2016] pantoprazole  40 mg Intravenous Q12H  . sodium chloride flush  3 mL Intravenous Q12H  . spironolactone  100 mg Oral Daily  . thiamine  100 mg Oral Daily   Or  . thiamine  100 mg Intravenous Daily   Continuous Infusions: . pantoprozole (PROTONIX) infusion 8 mg/hr (09/27/16 1104)   PRN Meds:.sodium  chloride, acetaminophen **OR** acetaminophen, bisacodyl, HYDROcodone-acetaminophen, LORazepam **OR** LORazepam, magnesium citrate, ondansetron **OR** ondansetron (ZOFRAN) IV, senna-docusate, sodium chloride flush, zolpidem   Data Review:   Micro Results No results found for this or any previous visit (from the past 240 hour(s)).  Radiology Reports Dg Chest Portable 1 View  Result Date: 09/27/2016 CLINICAL DATA:  Dyspnea.  Right upper quadrant pain and distension. EXAM: PORTABLE CHEST 1 VIEW COMPARISON:  None. FINDINGS: Lung volumes are low. The cardiomediastinal contours are normal. Pulmonary vasculature is normal. No consolidation, pleural effusion, or pneumothorax. No acute osseous abnormalities are seen. Remote right rib fractures. IMPRESSION: Low lung volumes without evidence of acute abnormality. Electronically Signed   By: Rubye OaksMelanie  Ehinger M.D.   On: 09/27/2016 01:20     CBC  Recent Labs Lab 09/26/16 2338 09/27/16 0532 09/27/16 1005  WBC 5.5 4.1  --   HGB 14.3 13.5 14.0  HCT 42.2 39.6* 40.6  PLT 127* 118*  --   MCV 98.9 98.0  --   MCH 33.6 33.4  --   MCHC 34.0 34.1  --   RDW 15.2* 15.3*  --     Chemistries   Recent Labs Lab 09/26/16 2338 09/27/16 0532  NA 137 137  K 3.5 3.8  CL 103 106  CO2 25 23  GLUCOSE 116* 109*  BUN 6 7  CREATININE 0.72 0.56*  CALCIUM 8.0* 7.8*  MG  --  1.7  AST 85*  --   ALT 33  --   ALKPHOS 65  --   BILITOT 2.0*  --    ------------------------------------------------------------------------------------------------------------------ estimated creatinine clearance is 122.6 mL/min (by C-G formula based on SCr of 0.56 mg/dL (L)). ------------------------------------------------------------------------------------------------------------------ No results for input(s): HGBA1C in the last 72 hours. ------------------------------------------------------------------------------------------------------------------ No results for input(s):  CHOL, HDL, LDLCALC, TRIG, CHOLHDL, LDLDIRECT in the last 72 hours. ------------------------------------------------------------------------------------------------------------------ No results for input(s): TSH, T4TOTAL, T3FREE, THYROIDAB in the last 72 hours.  Invalid input(s): FREET3 ------------------------------------------------------------------------------------------------------------------ No results for input(s): VITAMINB12, FOLATE, FERRITIN, TIBC, IRON, RETICCTPCT in the last 72 hours.  Coagulation profile  Recent Labs Lab 09/27/16 0532  INR 1.54    No results for input(s): DDIMER in the last 72 hours.  Cardiac Enzymes No results for input(s): CKMB, TROPONINI, MYOGLOBIN in the last 168 hours.  Invalid input(s): CK ------------------------------------------------------------------------------------------------------------------ Invalid input(s): POCBNP    Assessment & Plan  Patient is a 60 year old presented with hemetemesis 1. Hematemesis could be related to gastritis esophagitis, due to mild episode unlikely variceal bleed Patient has not had any further bleeding, will monitor hemoglobin no GI coverage today since patient has no active bleeding we'll continue to monitor will ask GI to see if he has further bleeding tomorrow Stop IV Protonix drip will place him on Protonix IV twice a day 2.  ascites with symptomatic swelling I'll ask ultrasound-guided paracentesis 3.  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  chronic neuropathy 5. Miscellaneous SCDs for DVT prophylaxis     Code Status Orders        Start     Ordered   09/27/16 0251  Full code  Continuous     09/27/16 0251    Code Status History    Date Active Date Inactive Code Status Order ID Comments User Context   09/27/2016  2:51 AM 09/27/2016  2:51 AM Full Code [id number]  Tonye RoyaltyAlexis Hugelmeyer, DO Inpatient           Consults  None   DVT Prophylaxis scd's  Lab Results   Component Value Date   PLT 118 (L) 09/27/2016     Time Spent in minutes  35min  Greater than 50% of time spent in care coordination and counseling patient regarding the condition and plan of care.   Auburn BilberryPATEL, Amey Hossain M.D on 09/27/2016 at 2:48 PM  Between 7am to 6pm - Pager - [phone]  After 6pm go to [email or web address] - password [email or web address] [facility]  [facility]   Office  [phone]

## 2016-09-27 NOTE — H&P (Signed)
[facility] - [place] at [facility]   PATIENT NAME: Antonio Ryan    MR#:  [id number]  DATE OF BIRTH:  [birth year]  DATE OF ADMISSION:  09/26/2016  PRIMARY CARE PHYSICIAN: No PCP Per Patient   REQUESTING/REFERRING PHYSICIAN:   CHIEF COMPLAINT:   Chief Complaint  Patient presents with  . Abdominal Pain  . Nausea    HISTORY OF PRESENT ILLNESS: Nesanel Aguila  is a 60 y.o. male with a known history of Hepatitis C, cirrhosis of liver, GERD, spinal stenosis presented to the emergency room with abdominal pain, nausea. The abdominal pain is aching in nature 5 out of 10 on a scale of 1-10. Patient has abdominal distention secondary to fluid accumulation. He had a paracentesis done last month. Patient also had vomiting of blood one episode yesterday. Vomitus contained bright red blood. Patient takes ibuprofen medication at home for pain as needed. No history of any blood in the stool or any diarrhea. No complaints of any fever, chills and cough. No headache dizziness or blurry vision. Patient does abdominal discomfort secondary to distention from fluid. Patient was evaluated in the emergency room hemoglobin was stable around 14. Patient also drinks alcohol on a regular basis. His last drink of alcohol was yesterday evening. Hospitalist service was consulted for further care of the patient. Case was discussed with gastroenterologist on-call by ER physician.  PAST MEDICAL HISTORY:   Past Medical History:  Diagnosis Date  . Acid reflux   . Cirrhosis (HCC)   . Hepatitis C   . Neuropathy (HCC)   . Spinal stenosis     PAST SURGICAL HISTORY: Past Surgical History:  Procedure Laterality Date  . BACK SURGERY      SOCIAL HISTORY:  Social History  Substance Use Topics  . Smoking status: Former Games developer  . Smokeless tobacco: Never Used  . Alcohol use 2.4 oz/week    4 Glasses of wine per week    FAMILY HISTORY:  Family History  Problem Relation Age of Onset  .  Hypertension Mother   . Arthritis-Osteo Mother     DRUG ALLERGIES: No Known Allergies  REVIEW OF SYSTEMS:   CONSTITUTIONAL: No fever, has weakness.  EYES: No blurred or double vision.  EARS, NOSE, AND THROAT: No tinnitus or ear pain.  RESPIRATORY: No cough, shortness of breath, wheezing or hemoptysis.  CARDIOVASCULAR: No chest pain, orthopnea, edema.  GASTROINTESTINAL: Has nausea, vomiting and abdominal pain.  Has vomited blood. GENITOURINARY: No dysuria, hematuria.  ENDOCRINE: No polyuria, nocturia,  HEMATOLOGY: No anemia, easy bruising or bleeding SKIN: No rash or lesion. MUSCULOSKELETAL: No joint pain or arthritis.   NEUROLOGIC: No tingling, numbness, weakness.  PSYCHIATRY: No anxiety or depression.   MEDICATIONS AT HOME:  Prior to Admission medications   Medication Sig Start Date End Date Taking? Authorizing Provider  ibuprofen (ADVIL,MOTRIN) 200 MG tablet Take 400-600 mg by mouth every 6 (six) hours as needed for fever or headache.    Yes Historical Provider, MD  lisinopril (PRINIVIL,ZESTRIL) 10 MG tablet Take 10 mg by mouth daily.   Yes Historical Provider, MD  omeprazole (PRILOSEC) 20 MG capsule Take 20 mg by mouth daily.   Yes Historical Provider, MD  cephALEXin (KEFLEX) 500 MG capsule Take 1 capsule (500 mg total) by mouth 3 (three) times daily. Patient not taking: Reported on 09/27/2016 09/09/16   Minna Antis, MD      PHYSICAL EXAMINATION:   VITAL SIGNS: Blood pressure (!) 147/82, pulse 85, temperature 98.7 F (37.1 C),  temperature source Oral, resp. rate 10, height 6\' 1"  (1.854 m), weight 104.3 kg (230 lb), SpO2 92 %.  GENERAL:  60 y.o.-year-old patient lying in the bed in mild distress secondary to abdominal distension. EYES: Pupils equal, round, reactive to light and accommodation. Has scleral icterus. Extraocular muscles intact.  HEENT: Head atraumatic, normocephalic. Oropharynx and nasopharynx clear.  NECK:  Supple, no jugular venous distention. No  thyroid enlargement, no tenderness.  LUNGS: Normal breath sounds bilaterally, no wheezing, rales,rhonchi or crepitation. No use of accessory muscles of respiration.  CARDIOVASCULAR: S1, S2 normal. No murmurs, rubs, or gallops.  ABDOMEN: Soft, tenderness around umbilicus,  abdomen is distended. Bowel sounds present. No organomegaly or mass.  Ascites noted. EXTREMITIES: No pedal edema, cyanosis, or clubbing.  NEUROLOGIC: Cranial nerves II through XII are intact. Muscle strength 5/5 in all extremities. Sensation intact. Gait not checked.  PSYCHIATRIC: The patient is alert and oriented x 3.  SKIN: No obvious rash, lesion, or ulcer.   LABORATORY PANEL:   CBC  Recent Labs Lab 09/26/16 2338  WBC 5.5  HGB 14.3  HCT 42.2  PLT 127*  MCV 98.9  MCH 33.6  MCHC 34.0  RDW 15.2*   ------------------------------------------------------------------------------------------------------------------  Chemistries   Recent Labs Lab 09/26/16 2338  NA 137  K 3.5  CL 103  CO2 25  GLUCOSE 116*  BUN 6  CREATININE 0.72  CALCIUM 8.0*  AST 85*  ALT 33  ALKPHOS 65  BILITOT 2.0*   ------------------------------------------------------------------------------------------------------------------ estimated creatinine clearance is 121.5 mL/min (by C-G formula based on SCr of 0.72 mg/dL). ------------------------------------------------------------------------------------------------------------------ No results for input(s): TSH, T4TOTAL, T3FREE, THYROIDAB in the last 72 hours.  Invalid input(s): FREET3   Coagulation profile No results for input(s): INR, PROTIME in the last 168 hours. ------------------------------------------------------------------------------------------------------------------- No results for input(s): DDIMER in the last 72 hours. -------------------------------------------------------------------------------------------------------------------  Cardiac Enzymes No results  for input(s): CKMB, TROPONINI, MYOGLOBIN in the last 168 hours.  Invalid input(s): CK ------------------------------------------------------------------------------------------------------------------ Invalid input(s): POCBNP  ---------------------------------------------------------------------------------------------------------------  Urinalysis    Component Value Date/Time   COLORURINE AMBER (A) 08/18/2016 1359   APPEARANCEUR CLEAR (A) 08/18/2016 1359   LABSPEC 1.017 08/18/2016 1359   PHURINE 6.0 08/18/2016 1359   GLUCOSEU NEGATIVE 08/18/2016 1359   HGBUR NEGATIVE 08/18/2016 1359   BILIRUBINUR 1+ (A) 08/18/2016 1359   KETONESUR TRACE (A) 08/18/2016 1359   PROTEINUR 30 (A) 08/18/2016 1359   NITRITE NEGATIVE 08/18/2016 1359   LEUKOCYTESUR NEGATIVE 08/18/2016 1359     RADIOLOGY: Dg Chest Portable 1 View  Result Date: 09/27/2016 CLINICAL DATA:  Dyspnea.  Right upper quadrant pain and distension. EXAM: PORTABLE CHEST 1 VIEW COMPARISON:  None. FINDINGS: Lung volumes are low. The cardiomediastinal contours are normal. Pulmonary vasculature is normal. No consolidation, pleural effusion, or pneumothorax. No acute osseous abnormalities are seen. Remote right rib fractures. IMPRESSION: Low lung volumes without evidence of acute abnormality. Electronically Signed   By: Rubye OaksMelanie  Ehinger M.D.   On: 09/27/2016 01:20    EKG: No orders found for this or any previous visit.  IMPRESSION AND PLAN: 60 year old male patient with history of hepatitis C, cirrhosis of liver, GERD presented to the emergency room with abdominal distention, pain and vomiting of blood. Admitting diagnosis 1. Hematemesis 2. Acute gastrointestinal bleeding 3. Decompensated liver disease 4. Cirrhosis of liver with ascites 5. Chronic hepatitis C Treatment plan Admit patient to medical floor inpatient service Serial hemoglobin and hematocrit monitoring Diurese patient with IV Lasix Start patient on oral  Aldactone Gastroenterology consultation IV Protonix drip  Ultrasound-guided paracentesis for ascites DVT prophylaxis with sequential compression device to lower extremities Avoid blood thinner medication Avoid NSAIDs ciwa protocol for alcohol withdrawal Thiamine and folate Acid supplementation.  All the records are reviewed and case discussed with ED provider. Management plans discussed with the patient, family and they are in agreement.  CODE STATUS:FULL CODE    Code Status Orders        Start     Ordered   09/27/16 0251  Full code  Continuous     09/27/16 0251    Code Status History    Date Active Date Inactive Code Status Order ID Comments User Context   09/27/2016  2:51 AM 09/27/2016  2:51 AM Full Code [id number]  Tonye RoyaltyAlexis Hugelmeyer, DO Inpatient       TOTAL TIME TAKING CARE OF THIS PATIENT: 54 minutes.    Ihor AustinPavan Latavius Capizzi M.D on 09/27/2016 at 2:56 AM  Between 7am to 6pm - Pager - [phone]  After 6pm go to [email or web address] - password [email or web address] [facility]  [facility]  Office  [phone]  CC: Primary care physician; No PCP Per Patient

## 2016-09-27 NOTE — Progress Notes (Signed)
Dr. Emmit PomfretHugelmeyer paged for CIWA orders. Windy Carinaurner,Hosey Burmester K, RN 2:45 AM12/04/2016

## 2016-09-28 ENCOUNTER — Inpatient Hospital Stay: Payer: Medicaid Other

## 2016-09-28 LAB — BASIC METABOLIC PANEL
ANION GAP: 6 (ref 5–15)
BUN: 6 mg/dL (ref 6–20)
CHLORIDE: 100 mmol/L — AB (ref 101–111)
CO2: 28 mmol/L (ref 22–32)
Calcium: 7.4 mg/dL — ABNORMAL LOW (ref 8.9–10.3)
Creatinine, Ser: 0.54 mg/dL — ABNORMAL LOW (ref 0.61–1.24)
GFR calc Af Amer: >60 mL/min (ref >60)
Glucose, Bld: 100 mg/dL — ABNORMAL HIGH (ref 65–99)
POTASSIUM: 3.2 mmol/L — AB (ref 3.5–5.1)
SODIUM: 134 mmol/L — AB (ref 135–145)

## 2016-09-28 LAB — CBC
HCT: 35.4 % — ABNORMAL LOW (ref 40.0–52.0)
HEMOGLOBIN: 12 g/dL — AB (ref 13.0–18.0)
MCH: 33.5 pg (ref 26.0–34.0)
MCHC: 34 g/dL (ref 32.0–36.0)
MCV: 98.8 fL (ref 80.0–100.0)
PLATELETS: 71 10*3/uL — AB (ref 150–440)
RBC: 3.59 MIL/uL — AB (ref 4.40–5.90)
RDW: 14.8 % — ABNORMAL HIGH (ref 11.5–14.5)
WBC: 2.6 10*3/uL — AB (ref 3.8–10.6)

## 2016-09-28 LAB — HEMOGLOBIN AND HEMATOCRIT, BLOOD
HEMATOCRIT: 38.3 % — AB (ref 40.0–52.0)
HEMOGLOBIN: 13.3 g/dL (ref 13.0–18.0)

## 2016-09-28 MED ORDER — DOCUSATE SODIUM 100 MG PO CAPS
200.0000 mg | ORAL_CAPSULE | Freq: Two times a day (BID) | ORAL | Status: DC
  Administered 2016-09-28 – 2016-09-30 (×5): 200 mg via ORAL
  Filled 2016-09-28 (×5): qty 2

## 2016-09-28 MED ORDER — IOPAMIDOL (ISOVUE-300) INJECTION 61%
15.0000 mL | INTRAVENOUS | Status: AC
  Administered 2016-09-28 (×2): 15 mL via ORAL

## 2016-09-28 MED ORDER — LACTULOSE 10 GM/15ML PO SOLN
30.0000 g | Freq: Two times a day (BID) | ORAL | Status: DC
  Administered 2016-09-28 – 2016-10-02 (×9): 30 g via ORAL
  Filled 2016-09-28 (×11): qty 60

## 2016-09-28 MED ORDER — IOPAMIDOL (ISOVUE-300) INJECTION 61%
100.0000 mL | Freq: Once | INTRAVENOUS | Status: AC | PRN
  Administered 2016-09-28: 100 mL via INTRAVENOUS

## 2016-09-28 MED ORDER — POTASSIUM CHLORIDE CRYS ER 20 MEQ PO TBCR
40.0000 meq | EXTENDED_RELEASE_TABLET | Freq: Two times a day (BID) | ORAL | Status: DC
  Administered 2016-09-28 – 2016-09-30 (×5): 40 meq via ORAL
  Filled 2016-09-28 (×5): qty 2

## 2016-09-28 MED ORDER — MORPHINE SULFATE (PF) 4 MG/ML IV SOLN
1.0000 mg | Freq: Four times a day (QID) | INTRAVENOUS | Status: DC | PRN
  Administered 2016-09-28 – 2016-10-02 (×9): 1 mg via INTRAVENOUS
  Filled 2016-09-28 (×9): qty 1

## 2016-09-28 NOTE — Plan of Care (Signed)
Problem: Physical Regulation: Goal: Ability to maintain clinical measurements within normal limits will improve Outcome: Not Progressing WBC trending down (2.6);

## 2016-09-28 NOTE — Care Management (Signed)
Patient admitted with Hematemesis.  Patient with history of alcohol abuse.  CIWA protocol.  Patient self pay.  ODC and Medication management placed on Chart for discharge.  RNCM following for discharge medication needs.

## 2016-09-28 NOTE — Progress Notes (Signed)
[employer] - [place] at [facility]                                                                                                                                                                                  Patient Demographics   Ethelene BrownsDonald Quarry, is a 60 y.o. male, DOB - 11/24/1955, WUJ:[id number]  Admit date - 09/26/2016   Admitting Physician Tonye RoyaltyAlexis Hugelmeyer, DO  Outpatient Primary MD for the patient is No PCP Per Patient   LOS - 1  Subjective: Continues to complain of pain in the abdomen but now he is seeing in the lower abdomen No further hemoptysis  Review of Systems:   CONSTITUTIONAL: No documented fever. No fatigue, weakness. No weight gain, no weight loss.  EYES: No blurry or double vision.  ENT: No tinnitus. No postnasal drip. No redness of the oropharynx.  RESPIRATORY: No cough, no wheeze, no hemoptysis. No dyspnea.  CARDIOVASCULAR: No chest pain. No orthopnea. No palpitations. No syncope.  GASTROINTESTINAL: No nausea, no vomiting or diarrhea. Positive abdominal pain. Positive abdominal distention No melena or hematochezia.  GENITOURINARY: No dysuria or hematuria.  ENDOCRINE: No polyuria or nocturia. No heat or cold intolerance.  HEMATOLOGY: No anemia. No bruising. No bleeding.  INTEGUMENTARY: No rashes. No lesions.  MUSCULOSKELETAL: No arthritis. No swelling. No gout.  NEUROLOGIC: No numbness, tingling, or ataxia. No seizure-type activity.  PSYCHIATRIC: No anxiety. No insomnia. No ADD.    Vitals:   Vitals:   09/28/16 0448 09/28/16 0821 09/28/16 1136 09/28/16 1311  BP: (!) 128/58 (!) 151/82  (!) 149/75  Pulse: 75  68 70  Resp: 18   18  Temp: 98.2 F (36.8 C)   97.7 F (36.5 C)  TempSrc: Oral   Oral  SpO2: 96%     Weight:      Height:        Wt Readings from Last 3 Encounters:  09/27/16 234 lb 9.6 oz (106.4 kg)  09/09/16 231 lb 11.2 oz (105.1 kg)  08/18/16 230 lb (104.3 kg)     Intake/Output Summary (Last 24 hours) at 09/28/16  1318 Last data filed at 09/28/16 0500  Gross per 24 hour  Intake             1023 ml  Output             1150 ml  Net             -127 ml    Physical Exam:   GENERAL: Pleasant-appearing in no apparent distress.  HEAD, EYES, EARS, NOSE AND THROAT: Atraumatic, normocephalic. Extraocular muscles are intact. Pupils equal and reactive to light. Sclerae anicteric. No conjunctival injection. No  oro-pharyngeal erythema.  NECK: Supple. There is no jugular venous distention. No bruits, no lymphadenopathy, no thyromegaly.  HEART: Regular rate and rhythm,. No murmurs, no rubs, no clicks.  LUNGS: Clear to auscultation bilaterally. No rales or rhonchi. No wheezes.  ABDOMEN: Soft, positively distended and b/l abdominal tenderness, Has good bowel sounds. No hepatosplenomegaly appreciated.  EXTREMITIES: No evidence of any cyanosis, clubbing, or peripheral edema.  +2 pedal and radial pulses bilaterally.  NEUROLOGIC: The patient is alert, awake, and oriented x3 with no focal motor or sensory deficits appreciated bilaterally.  SKIN: Moist and warm with no rashes appreciated.  Psych: Not anxious, depressed LN: No inguinal LN enlargement    Antibiotics   Anti-infectives    None      Medications   Scheduled Meds: . docusate sodium  200 mg Oral BID  . folic acid  1 mg Oral Daily  . furosemide  40 mg Intravenous BID  . Influenza vac split quadrivalent PF  0.5 mL Intramuscular Tomorrow-1000  . lactulose  30 g Oral BID  . LORazepam  0-4 mg Oral Q6H   Followed by  . [START ON 09/29/2016] LORazepam  0-4 mg Oral Q12H  . multivitamin with minerals  1 tablet Oral Daily  . pantoprazole  40 mg Intravenous Q12H  . sodium chloride flush  3 mL Intravenous Q12H  . spironolactone  100 mg Oral Daily  . thiamine  100 mg Oral Daily   Or  . thiamine  100 mg Intravenous Daily   Continuous Infusions:  PRN Meds:.sodium chloride, acetaminophen **OR** acetaminophen, bisacodyl, HYDROcodone-acetaminophen, LORazepam  **OR** LORazepam, magnesium citrate, ondansetron **OR** ondansetron (ZOFRAN) IV, senna-docusate, sodium chloride flush, zolpidem   Data Review:   Micro Results No results found for this or any previous visit (from the past 240 hour(s)).  Radiology Reports [place] Paracentesis  Result Date: 09/27/2016 INDICATION: Ascites EXAM: ULTRASOUND GUIDED PARACENTESIS MEDICATIONS: None. COMPLICATIONS: None immediate. PROCEDURE: Informed written consent was obtained from the patient after a discussion of the risks, benefits and alternatives to treatment. A timeout was performed prior to the initiation of the procedure. Initial ultrasound scanning demonstrates a large amount of ascites within the right abdomen . The right abdomen was prepped and draped in the usual sterile fashion. 1% lidocaine was used for local anesthesia. Following this, a 6 Fr Safe-T-Centesis catheter was introduced. An ultrasound image was saved for documentation purposes. The paracentesis was performed. The catheter was removed and a dressing was applied. The patient tolerated the procedure well without immediate post procedural complication. FINDINGS: A total of approximately 13.275 L of clear yellow fluid was removed. IMPRESSION: Successful ultrasound-guided paracentesis yielding 13.275 liters of peritoneal fluid. Electronically Signed   By: Alcide CleverMark  Lukens M.D.   On: 09/27/2016 16:24   Dg Chest Portable 1 View  Result Date: 09/27/2016 CLINICAL DATA:  Dyspnea.  Right upper quadrant pain and distension. EXAM: PORTABLE CHEST 1 VIEW COMPARISON:  None. FINDINGS: Lung volumes are low. The cardiomediastinal contours are normal. Pulmonary vasculature is normal. No consolidation, pleural effusion, or pneumothorax. No acute osseous abnormalities are seen. Remote right rib fractures. IMPRESSION: Low lung volumes without evidence of acute abnormality. Electronically Signed   By: Rubye OaksMelanie  Ehinger M.D.   On: 09/27/2016 01:20     CBC  Recent Labs Lab  09/26/16 2338 09/27/16 0532 09/27/16 1005 09/27/16 1840 09/28/16 0230 09/28/16 1000  WBC 5.5 4.1  --   --  2.6*  --   HGB 14.3 13.5 14.0 13.0 12.0* 13.3  HCT 42.2  39.6* 40.6 38.1* 35.4* 38.3*  PLT 127* 118*  --   --  71*  --   MCV 98.9 98.0  --   --  98.8  --   MCH 33.6 33.4  --   --  33.5  --   MCHC 34.0 34.1  --   --  34.0  --   RDW 15.2* 15.3*  --   --  14.8*  --     Chemistries   Recent Labs Lab 09/26/16 2338 09/27/16 0532 09/28/16 0230  NA 137 137 134*  K 3.5 3.8 3.2*  CL 103 106 100*  CO2 25 23 28   GLUCOSE 116* 109* 100*  BUN 6 7 6   CREATININE 0.72 0.56* 0.54*  CALCIUM 8.0* 7.8* 7.4*  MG  --  1.7  --   AST 85*  --   --   ALT 33  --   --   ALKPHOS 65  --   --   BILITOT 2.0*  --   --    ------------------------------------------------------------------------------------------------------------------ estimated creatinine clearance is 122.6 mL/min (by C-G formula based on SCr of 0.54 mg/dL (L)). ------------------------------------------------------------------------------------------------------------------ No results for input(s): HGBA1C in the last 72 hours. ------------------------------------------------------------------------------------------------------------------ No results for input(s): CHOL, HDL, LDLCALC, TRIG, CHOLHDL, LDLDIRECT in the last 72 hours. ------------------------------------------------------------------------------------------------------------------ No results for input(s): TSH, T4TOTAL, T3FREE, THYROIDAB in the last 72 hours.  Invalid input(s): FREET3 ------------------------------------------------------------------------------------------------------------------ No results for input(s): VITAMINB12, FOLATE, FERRITIN, TIBC, IRON, RETICCTPCT in the last 72 hours.  Coagulation profile  Recent Labs Lab 09/27/16 0532  INR 1.54    No results for input(s): DDIMER in the last 72 hours.  Cardiac Enzymes No results for input(s):  CKMB, TROPONINI, MYOGLOBIN in the last 168 hours.  Invalid input(s): CK ------------------------------------------------------------------------------------------------------------------ Invalid input(s): POCBNP    Assessment & Plan  Patient is a 60 year old presented with hemetemesis 1. Hematemesis No further episodes will as GI to see continue Protonix twice a day 2.  ascites with symptomatic status post paracentesis yesterday with 12 L of fluid removed 3. Abdominal pain- I will obtain CT scan of the abdomen due to persistent symptoms his symptoms are very vague 3.  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  chronic neuropathy 5. Miscellaneous SCDs for DVT prophylaxis     Code Status Orders        Start     Ordered   09/27/16 0251  Full code  Continuous     09/27/16 0251    Code Status History    Date Active Date Inactive Code Status Order ID Comments User Context   09/27/2016  2:51 AM 09/27/2016  2:51 AM Full Code [id number]  Tonye Royalty, DO Inpatient           Consults  None   DVT Prophylaxis scd's  Lab Results  Component Value Date   PLT 71 (L) 09/28/2016     Time Spent in minutes   Greater than 50% of time spent in care coordination and counseling patient regarding the condition and plan of care.   Auburn Bilberry M.D on 09/28/2016 at 1:18 PM  Between 7am to 6pm - Pager - [phone]  After 6pm go to [email or web address] - password [email or web address] [facility]  [facility]   Office  [phone]

## 2016-09-28 NOTE — Plan of Care (Signed)
Problem: Pain Managment: Goal: General experience of comfort will improve Outcome: Not Progressing Pt still complains of severe pain despite administration of PRN pain medicines.  Pt suspects this pain is somewhat due to constipation, and has been started on stool softeners and lactulose.  Will continue to monitor and assess. Bradly Chrisougherty, Lajuane Leatham E, RN

## 2016-09-29 LAB — BASIC METABOLIC PANEL
Anion gap: 7 (ref 5–15)
BUN: 7 mg/dL (ref 6–20)
CHLORIDE: 100 mmol/L — AB (ref 101–111)
CO2: 27 mmol/L (ref 22–32)
CREATININE: 0.6 mg/dL — AB (ref 0.61–1.24)
Calcium: 8 mg/dL — ABNORMAL LOW (ref 8.9–10.3)
GFR calc Af Amer: >60 mL/min (ref >60)
GFR calc non Af Amer: >60 mL/min (ref >60)
GLUCOSE: 127 mg/dL — AB (ref 65–99)
POTASSIUM: 3.5 mmol/L (ref 3.5–5.1)
Sodium: 134 mmol/L — ABNORMAL LOW (ref 135–145)

## 2016-09-29 LAB — CBC
HCT: 43.9 % (ref 40.0–52.0)
Hemoglobin: 14.8 g/dL (ref 13.0–18.0)
MCH: 33.4 pg (ref 26.0–34.0)
MCHC: 33.7 g/dL (ref 32.0–36.0)
MCV: 99 fL (ref 80.0–100.0)
PLATELETS: 81 10*3/uL — AB (ref 150–440)
RBC: 4.43 MIL/uL (ref 4.40–5.90)
RDW: 14.9 % — ABNORMAL HIGH (ref 11.5–14.5)
WBC: 3.5 10*3/uL — ABNORMAL LOW (ref 3.8–10.6)

## 2016-09-29 MED ORDER — FLEET ENEMA 7-19 GM/118ML RE ENEM
1.0000 | ENEMA | Freq: Two times a day (BID) | RECTAL | Status: DC
  Administered 2016-09-29 – 2016-09-30 (×3): 1 via RECTAL

## 2016-09-29 MED ORDER — CEFTRIAXONE SODIUM-DEXTROSE 1-3.74 GM-% IV SOLR
1.0000 g | Freq: Every day | INTRAVENOUS | Status: DC
  Administered 2016-09-29 – 2016-10-01 (×3): 1 g via INTRAVENOUS
  Filled 2016-09-29 (×3): qty 50

## 2016-09-29 MED ORDER — FUROSEMIDE 40 MG PO TABS
40.0000 mg | ORAL_TABLET | Freq: Two times a day (BID) | ORAL | Status: DC
  Administered 2016-09-29 – 2016-09-30 (×2): 40 mg via ORAL
  Filled 2016-09-29 (×2): qty 1

## 2016-09-29 MED ORDER — OXYCODONE HCL 5 MG PO TABS
10.0000 mg | ORAL_TABLET | ORAL | Status: DC | PRN
  Administered 2016-09-29: 10 mg via ORAL
  Filled 2016-09-29: qty 2

## 2016-09-29 MED ORDER — DEXTROSE 5 % IV SOLN: 1.0000 g | INTRAVENOUS | Status: DC

## 2016-09-29 MED ORDER — BISACODYL 10 MG RE SUPP
10.0000 mg | Freq: Every day | RECTAL | Status: DC
  Administered 2016-09-29: 10 mg via RECTAL
  Filled 2016-09-29: qty 1

## 2016-09-29 NOTE — Consult Note (Signed)
Referring Provider: Dr. Renae Gloss Primary Care Physician:  No PCP Per Patient Primary Gastroenterologist:  Gentry Fitz  Reason for Consultation:  GI bleed  HPI: Antonio Ryan is a 60 y.o. male with cirrhosis with a history of Hep C and alcohol abuse admitted for abdominal pain and nausea/vomiting. He had one episode of bright red bloody vomitus prior to admission that has not recurred. Reports having one episode of vomiting daily for the past month. Has been constipated and given an enema today with a small amount of stool following the enema. Reports his last BM was Wed. Denies melena or hematemesis. He has been having sharp diffuse abdominal pain prior to and since admission and reports taking Ibuprofen daily for the abdominal pain. Used to drink 2 bottles of wine per day for years and reports stopping following his last admission until drinking again starting on Thanksgiving. Hgb 14 on admit.    Past Medical History:  Diagnosis Date  . Acid reflux   . Cirrhosis (HCC)   . Hepatitis C   . Hypertension   . Neuropathy (HCC)   . Spinal stenosis     Past Surgical History:  Procedure Laterality Date  . BACK SURGERY      Prior to Admission medications   Medication Sig Start Date End Date Taking? Authorizing Provider  ibuprofen (ADVIL,MOTRIN) 200 MG tablet Take 400-600 mg by mouth every 6 (six) hours as needed for fever or headache.    Yes Historical Provider, MD  lisinopril (PRINIVIL,ZESTRIL) 10 MG tablet Take 10 mg by mouth daily.   Yes Historical Provider, MD  omeprazole (PRILOSEC) 20 MG capsule Take 20 mg by mouth daily.   Yes Historical Provider, MD  cephALEXin (KEFLEX) 500 MG capsule Take 1 capsule (500 mg total) by mouth 3 (three) times daily. Patient not taking: Reported on 09/27/2016 09/09/16   Minna Antis, MD    Scheduled Meds: . bisacodyl  10 mg Rectal Q1500  . cefTRIAXone  1 g Intravenous Daily  . docusate sodium  200 mg Oral BID  . folic acid  1 mg Oral Daily  .  furosemide  40 mg Oral BID  . lactulose  30 g Oral BID  . LORazepam  0-4 mg Oral Q12H  . multivitamin with minerals  1 tablet Oral Daily  . pantoprazole  40 mg Intravenous Q12H  . potassium chloride  40 mEq Oral BID  . sodium chloride flush  3 mL Intravenous Q12H  . sodium phosphate  1 enema Rectal BID  . spironolactone  100 mg Oral Daily  . thiamine  100 mg Oral Daily   Or  . thiamine  100 mg Intravenous Daily   Continuous Infusions: PRN Meds:.sodium chloride, acetaminophen **OR** acetaminophen, bisacodyl, LORazepam **OR** LORazepam, morphine injection, ondansetron **OR** ondansetron (ZOFRAN) IV, oxyCODONE, senna-docusate, sodium chloride flush, zolpidem  Allergies as of 09/26/2016  . (No Known Allergies)    Family History  Problem Relation Age of Onset  . Hypertension Mother   . Arthritis-Osteo Mother     Social History   Social History  . Marital status: Divorced    Spouse name: N/A  . Number of children: N/A  . Years of education: N/A   Occupational History  . disabled    Social History Main Topics  . Smoking status: Former Games developer  . Smokeless tobacco: Never Used  . Alcohol use 2.4 oz/week    4 Glasses of wine per week  . Drug use: No  . Sexual activity: Not on file  Other Topics Concern  . Not on file   Social History Narrative  . No narrative on file    Review of Systems: All negative except as stated above in HPI.  Physical Exam: Vital signs: Vitals:   09/28/16 2129 09/29/16 0420  BP: 132/84 (!) 170/93  Pulse: 68 92  Resp: 20 (!) 24  Temp: 97.8 F (36.6 C) 97.3 F (36.3 C)   Last BM Date: 09/26/16 General:   Alert,  Well-developed, well-nourished, pleasant and cooperative in NAD, oriented X 3 HEENT: anicteric sclera Neck: supple, nontender Lungs:  Clear throughout to auscultation.   No wheezes, crackles, or rhonchi. No acute distress. Heart:  Regular rate and rhythm; no murmurs, clicks, rubs,  or gallops. Abdomen: diffuse tenderness with  guarding, +distention, +BS  Rectal:  Deferred Ext: no edema  GI:  Lab Results:  Recent Labs  09/27/16 0532  09/28/16 0230 09/28/16 1000 09/29/16 0439  WBC 4.1  --  2.6*  --  3.5*  HGB 13.5  < > 12.0* 13.3 14.8  HCT 39.6*  < > 35.4* 38.3* 43.9  PLT 118*  --  71*  --  81*  < > = values in this interval not displayed. BMET  Recent Labs  09/27/16 0532 09/28/16 0230 09/29/16 0439  NA 137 134* 134*  K 3.8 3.2* 3.5  CL 106 100* 100*  CO2 23 28 27   GLUCOSE 109* 100* 127*  BUN 7 6 7   CREATININE 0.56* 0.54* 0.60*  CALCIUM 7.8* 7.4* 8.0*   LFT  Recent Labs  09/26/16 2338  PROT 7.0  ALBUMIN 2.9*  AST 85*  ALT 33  ALKPHOS 65  BILITOT 2.0*   PT/INR  Recent Labs  09/27/16 0532  LABPROT 18.6*  INR 1.54     Studies/Results: Ct Abdomen Pelvis W Contrast  Result Date: 09/28/2016 CLINICAL DATA:  Nausea, vomiting and diarrhea for 1 month. History of hepatitis C and cirrhosis. EXAM: CT ABDOMEN AND PELVIS WITH CONTRAST TECHNIQUE: Multidetector CT imaging of the abdomen and pelvis was performed using the standard protocol following bolus administration of intravenous contrast. CONTRAST:  100 ml ISOVUE-300 IOPAMIDOL (ISOVUE-300) INJECTION 61% COMPARISON:  CT abdomen and pelvis 08/18/2016. FINDINGS: Lower chest: Mild basilar atelectasis. Calcific coronary artery disease is seen. No pleural or pericardial effusion. Hepatobiliary: Markedly shrunken and nodular liver consistent with cirrhosis is again identified. Associated moderate to moderately large volume of ascites is decreased since the prior study. The gallbladder and biliary tree are unremarkable. No focal liver lesion. Pancreas: Appears normal. Spleen: Normal in size.  No focal lesion. Adrenals/Urinary Tract: Adrenal glands are unremarkable. Kidneys are normal, without renal calculi, focal lesion, or hydronephrosis. Bladder is unremarkable. Stomach/Bowel: Mild thickening of the walls of the ascending colon is likely related to  cirrhosis. The colon is otherwise unremarkable. The stomach and small bowel appear normal. Vascular/Lymphatic: Aortoiliac atherosclerosis without aneurysm is identified. A few small gastrohepatic lymph nodes are likely reactive. Reproductive: Prostate gland is slightly prominent. Other: No hernia. Musculoskeletal: No acute bony abnormality. Status post L4-5 fusion. IMPRESSION: No acute abnormality. Cirrhotic liver with associated moderate to moderately large volume of ascites. Ascites is decreased since the prior CT. Aortoiliac atherosclerosis. Electronically Signed   By: Drusilla Kannerhomas  Dalessio M.D.   On: 09/28/2016 14:46   [place] Paracentesis  Result Date: 09/27/2016 INDICATION: Ascites EXAM: ULTRASOUND GUIDED PARACENTESIS MEDICATIONS: None. COMPLICATIONS: None immediate. PROCEDURE: Informed written consent was obtained from the patient after a discussion of the risks, benefits and alternatives to treatment. A timeout  was performed prior to the initiation of the procedure. Initial ultrasound scanning demonstrates a large amount of ascites within the right abdomen . The right abdomen was prepped and draped in the usual sterile fashion. 1% lidocaine was used for local anesthesia. Following this, a 6 Fr Safe-T-Centesis catheter was introduced. An ultrasound image was saved for documentation purposes. The paracentesis was performed. The catheter was removed and a dressing was applied. The patient tolerated the procedure well without immediate post procedural complication. FINDINGS: A total of approximately 13.275 L of clear yellow fluid was removed. IMPRESSION: Successful ultrasound-guided paracentesis yielding 13.275 liters of peritoneal fluid. Electronically Signed   By: Alcide CleverMark  Lukens M.D.   On: 09/27/2016 16:24    Impression/Plan: 60 yo with decompensated cirrhosis with ascites and recent GI bleed - 2.7 L removed in ER on 09/10/16. 13 L of ascites removed on 09/27/16 by radiology. His abdominal pain could be from  spontaneous bacterial peritonitis and agree with IV Cetriaxone. Constipation also contributing to his abdominal pain. Increase Lactulose dose. Continue Protonix 40 mg IV Q 12 hours. Symptoms not consistent with a variceal bleed. EGD not needed at this time. Hgb 14.8. Ok to give ice chips and sips of clears. Will follow.   LOS: 2 days   Antonio Ryan C.  09/29/2016, 12:13 PM

## 2016-09-29 NOTE — Progress Notes (Signed)
Patient ID: Antonio Ryan, male   DOB: 08/10/1956, 60 y.o.   MRN: [id number]  [facility] PROGRESS NOTE  Antonio Ryan WUJ:[id number] DOB: 06/10/1956 DOA: 09/26/2016 PCP: No PCP Per Patient  HPI/Subjective: Patient with severe sharpe stabbing 10 out of 10 lower abdominal pain. This has been constant. Patient in pain but looks comfortable sitting in bed.  Patient has not had a bowel movement since Wednesday.  Objective: Vitals:   09/28/16 2129 09/29/16 0420  BP: 132/84 (!) 170/93  Pulse: 68 92  Resp: 20 (!) 24  Temp: 97.8 F (36.6 C) 97.3 F (36.3 C)    Filed Weights   09/26/16 2331 09/27/16 0300  Weight: 104.3 kg (230 lb) 106.4 kg (234 lb 9.6 oz)    ROS: Review of Systems  Constitutional: Negative for chills and fever.  Eyes: Negative for blurred vision.  Respiratory: Negative for cough and shortness of breath.   Cardiovascular: Negative for chest pain.  Gastrointestinal: Positive for abdominal pain, nausea and vomiting. Negative for constipation and diarrhea.  Genitourinary: Negative for dysuria.  Musculoskeletal: Negative for joint pain.  Neurological: Negative for dizziness and headaches.   Exam: Physical Exam  Constitutional: He is oriented to person, place, and time.  HENT:  Nose: No mucosal edema.  Mouth/Throat: No oropharyngeal exudate or posterior oropharyngeal edema.  Eyes: Conjunctivae, EOM and lids are normal. Pupils are equal, round, and reactive to light.  Neck: No JVD present. Carotid bruit is not present. No edema present. No thyroid mass and no thyromegaly present.  Cardiovascular: S1 normal and S2 normal.  Exam reveals no gallop.   No murmur heard. Pulses:      Dorsalis pedis pulses are 2+ on the right side, and 2+ on the left side.  Respiratory: No respiratory distress. He has no wheezes. He has no rhonchi. He has no rales.  GI: Soft. Bowel sounds are normal. He exhibits distension, fluid wave and ascites. There is tenderness in the suprapubic  area and left lower quadrant.  Musculoskeletal:       Right ankle: He exhibits no swelling.       Left ankle: He exhibits no swelling.  Lymphadenopathy:    He has no cervical adenopathy.  Neurological: He is alert and oriented to person, place, and time. No cranial nerve deficit.  Skin: Skin is warm. No rash noted. Nails show no clubbing.  Psychiatric: He has a normal mood and affect.      Data Reviewed: Basic Metabolic Panel:  Recent Labs Lab 09/26/16 2338 09/27/16 0532 09/28/16 0230 09/29/16 0439  NA 137 137 134* 134*  K 3.5 3.8 3.2* 3.5  CL 103 106 100* 100*  CO2 25 23 28 27   GLUCOSE 116* 109* 100* 127*  BUN 6 7 6 7   CREATININE 0.72 0.56* 0.54* 0.60*  CALCIUM 8.0* 7.8* 7.4* 8.0*  MG  --  1.7  --   --   PHOS  --  3.9  --   --    Liver Function Tests:  Recent Labs Lab 09/26/16 2338  AST 85*  ALT 33  ALKPHOS 65  BILITOT 2.0*  PROT 7.0  ALBUMIN 2.9*    Recent Labs Lab 09/26/16 2338  LIPASE 30   CBC:  Recent Labs Lab 09/26/16 2338 09/27/16 0532 09/27/16 1005 09/27/16 1840 09/28/16 0230 09/28/16 1000 09/29/16 0439  WBC 5.5 4.1  --   --  2.6*  --  3.5*  HGB 14.3 13.5 14.0 13.0 12.0* 13.3 14.8  HCT 42.2 39.6* 40.6 38.1*  35.4* 38.3* 43.9  MCV 98.9 98.0  --   --  98.8  --  99.0  PLT 127* 118*  --   --  71*  --  81*     Studies: Ct Abdomen Pelvis W Contrast  Result Date: 09/28/2016 CLINICAL DATA:  Nausea, vomiting and diarrhea for 1 month. History of hepatitis C and cirrhosis. EXAM: CT ABDOMEN AND PELVIS WITH CONTRAST TECHNIQUE: Multidetector CT imaging of the abdomen and pelvis was performed using the standard protocol following bolus administration of intravenous contrast. CONTRAST:  100 ml ISOVUE-300 IOPAMIDOL (ISOVUE-300) INJECTION 61% COMPARISON:  CT abdomen and pelvis 08/18/2016. FINDINGS: Lower chest: Mild basilar atelectasis. Calcific coronary artery disease is seen. No pleural or pericardial effusion. Hepatobiliary: Markedly shrunken and  nodular liver consistent with cirrhosis is again identified. Associated moderate to moderately large volume of ascites is decreased since the prior study. The gallbladder and biliary tree are unremarkable. No focal liver lesion. Pancreas: Appears normal. Spleen: Normal in size.  No focal lesion. Adrenals/Urinary Tract: Adrenal glands are unremarkable. Kidneys are normal, without renal calculi, focal lesion, or hydronephrosis. Bladder is unremarkable. Stomach/Bowel: Mild thickening of the walls of the ascending colon is likely related to cirrhosis. The colon is otherwise unremarkable. The stomach and small bowel appear normal. Vascular/Lymphatic: Aortoiliac atherosclerosis without aneurysm is identified. A few small gastrohepatic lymph nodes are likely reactive. Reproductive: Prostate gland is slightly prominent. Other: No hernia. Musculoskeletal: No acute bony abnormality. Status post L4-5 fusion. IMPRESSION: No acute abnormality. Cirrhotic liver with associated moderate to moderately large volume of ascites. Ascites is decreased since the prior CT. Aortoiliac atherosclerosis. Electronically Signed   By: Drusilla Kannerhomas  Dalessio M.D.   On: 09/28/2016 14:46   [place] Paracentesis  Result Date: 09/27/2016 INDICATION: Ascites EXAM: ULTRASOUND GUIDED PARACENTESIS MEDICATIONS: None. COMPLICATIONS: None immediate. PROCEDURE: Informed written consent was obtained from the patient after a discussion of the risks, benefits and alternatives to treatment. A timeout was performed prior to the initiation of the procedure. Initial ultrasound scanning demonstrates a large amount of ascites within the right abdomen . The right abdomen was prepped and draped in the usual sterile fashion. 1% lidocaine was used for local anesthesia. Following this, a 6 Fr Safe-T-Centesis catheter was introduced. An ultrasound image was saved for documentation purposes. The paracentesis was performed. The catheter was removed and a dressing was applied. The  patient tolerated the procedure well without immediate post procedural complication. FINDINGS: A total of approximately 13.275 L of clear yellow fluid was removed. IMPRESSION: Successful ultrasound-guided paracentesis yielding 13.275 liters of peritoneal fluid. Electronically Signed   By: Alcide CleverMark  Lukens M.D.   On: 09/27/2016 16:24    Scheduled Meds: . bisacodyl  10 mg Rectal Q1500  . cefTRIAXone (ROCEPHIN)  IV  1 g Intravenous Q24H  . docusate sodium  200 mg Oral BID  . folic acid  1 mg Oral Daily  . furosemide  40 mg Intravenous BID  . Influenza vac split quadrivalent PF  0.5 mL Intramuscular Tomorrow-1000  . lactulose  30 g Oral BID  . LORazepam  0-4 mg Oral Q12H  . multivitamin with minerals  1 tablet Oral Daily  . pantoprazole  40 mg Intravenous Q12H  . potassium chloride  40 mEq Oral BID  . sodium chloride flush  3 mL Intravenous Q12H  . sodium phosphate  1 enema Rectal BID  . spironolactone  100 mg Oral Daily  . thiamine  100 mg Oral Daily   Or  . thiamine  100  mg Intravenous Daily    Assessment/Plan:  1. Severe lower abdominal pain. CT scan unremarkable. Try to do oral pain medications. Since the patient has not had a bowel movement since Wednesday oh give enema and suppositories to see if I can get his bowels moving. No labs were sent off with paracentesis. Start empiric Rocephin in case SBP. Bladder scan after he urinates to see if this is urinary retention. 2. Nausea vomiting hematemesis. Hemoglobin stable. Still awaiting GI note. 3. Ascites.  Paracentesis at 13.275 L removed. Likely will end up needing repeated paracentesis as outpatient. Change Lasix to oral.already on spironolactone.  Empiric rocephin. 4. 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 Patient has  Thrombocytopenia and leukopenia. Patient has an elevated PT also secondary to liver disease. Try to add low dose nadolol at night.  Code Status:     Code Status Orders        Start     Ordered   09/27/16  0251  Full code  Continuous     09/27/16 0251    Code Status History    Date Active Date Inactive Code Status Order ID Comments User Context   09/27/2016  2:51 AM 09/27/2016  2:51 AM Full Code [id number]  Tonye Royalty, DO Inpatient      Disposition Plan: once pain controlled can go home  Antibiotics:  rocephin  Time spent: 28 minutes  Alford Highland  [employer]

## 2016-09-30 LAB — URINALYSIS, COMPLETE (UACMP) WITH MICROSCOPIC
Bacteria, UA: NONE SEEN
Bilirubin Urine: NEGATIVE
Glucose, UA: NEGATIVE mg/dL
Hgb urine dipstick: NEGATIVE
Ketones, ur: NEGATIVE mg/dL
Leukocytes, UA: NEGATIVE
Nitrite: NEGATIVE
Protein, ur: NEGATIVE mg/dL
Specific Gravity, Urine: 1.016 (ref 1.005–1.030)
pH: 6 (ref 5.0–8.0)

## 2016-09-30 MED ORDER — HYDROMORPHONE HCL 2 MG PO TABS
2.0000 mg | ORAL_TABLET | ORAL | Status: DC | PRN
  Administered 2016-10-01 – 2016-10-03 (×8): 2 mg via ORAL
  Filled 2016-09-30 (×8): qty 1

## 2016-09-30 MED ORDER — FUROSEMIDE 40 MG PO TABS
40.0000 mg | ORAL_TABLET | Freq: Every day | ORAL | Status: DC
  Administered 2016-10-01: 40 mg via ORAL
  Filled 2016-09-30: qty 1

## 2016-09-30 MED ORDER — RIFAXIMIN 550 MG PO TABS
550.0000 mg | ORAL_TABLET | Freq: Two times a day (BID) | ORAL | Status: DC
  Administered 2016-09-30 – 2016-10-02 (×6): 550 mg via ORAL
  Filled 2016-09-30 (×6): qty 1

## 2016-09-30 MED ORDER — NADOLOL 20 MG PO TABS
20.0000 mg | ORAL_TABLET | Freq: Every day | ORAL | Status: DC
  Administered 2016-09-30 – 2016-10-02 (×3): 20 mg via ORAL
  Filled 2016-09-30 (×3): qty 1

## 2016-09-30 NOTE — Progress Notes (Signed)
Gastroenterology Progress Note    Antonio Ryan 60 y.o. 12/04/1955   Subjective: Reports loose stools overnight. Continues to have abdominal pain. Resting comfortably in bed.  Objective: Vital signs in last 24 hours: Vitals:   09/29/16 2014 09/30/16 0541  BP: (!) 150/78 (!) 161/71  Pulse: 95 (!) 105  Resp: (!) 22 16  Temp: 98 F (36.7 C) 99.4 F (37.4 C)    Physical Exam: Gen: alert, no acute distress, poor dentition CV: RRR Chest: CTA B Abd: diffuse tenderness with guarding, +distention, +BS Ext: no edema  Lab Results:  Recent Labs  09/28/16 0230 09/29/16 0439  NA 134* 134*  K 3.2* 3.5  CL 100* 100*  CO2 28 27  GLUCOSE 100* 127*  BUN 6 7  CREATININE 0.54* 0.60*  CALCIUM 7.4* 8.0*   No results for input(s): AST, ALT, ALKPHOS, BILITOT, PROT, ALBUMIN in the last 72 hours.  Recent Labs  09/28/16 0230 09/28/16 1000 09/29/16 0439  WBC 2.6*  --  3.5*  HGB 12.0* 13.3 14.8  HCT 35.4* 38.3* 43.9  MCV 98.8  --  99.0  PLT 71*  --  81*   No results for input(s): LABPROT, INR in the last 72 hours.    Assessment/Plan: Decompensated cirrhosis with recurrent ascites; Treated empirically for SBP with IV Ceftriaxone. No sign of encephalopathy. Moving bowels with Lactulose. If diarrhea worsens, then may need decrease dose. Add Rifaximin. Advance diet as tolerated to low sodium diet.   Antonio Ryan C. 09/30/2016, 12:40 PM

## 2016-09-30 NOTE — Plan of Care (Signed)
Problem: Fluid Volume: Goal: Ability to maintain a balanced intake and output will improve Outcome: Not Progressing Patient is eating only ice chips and drinking sips of water.  Problem: Nutrition: Goal: Adequate nutrition will be maintained Outcome: Not Progressing Ice chips and sips of water.

## 2016-09-30 NOTE — Progress Notes (Signed)
Patient ID: Antonio BrownsDonald Vorhees, male   DOB: 09/21/1956, 60 y.o.   MRN: [id number]  [facility] PROGRESS NOTE  Antonio BrownsDonald Faiella WUJ:[id number]:[id number] DOB: 05/24/1956 DOA: 09/26/2016 PCP: No PCP Per Patient  HPI/Subjective: Patient with severe sharpe stabbing 10 out of 10 lower abdominal pain. This has been constant. Patient in pain but looks comfortable lying in bed.  Patient states he has no difficulty urinating. Patient states that he's had some diarrhea with the medications. Still having abdominal pain. Patient states he gets sick with the oral pain medications  Objective: Vitals:   09/30/16 0541 09/30/16 1339  BP: (!) 161/71 123/78  Pulse: (!) 105 98  Resp: 16 17  Temp: 99.4 F (37.4 C) 98.1 F (36.7 C)    Filed Weights   09/26/16 2331 09/27/16 0300  Weight: 104.3 kg (230 lb) 106.4 kg (234 lb 9.6 oz)    ROS: Review of Systems  Constitutional: Negative for chills and fever.  Eyes: Negative for blurred vision.  Respiratory: Negative for cough and shortness of breath.   Cardiovascular: Negative for chest pain.  Gastrointestinal: Positive for abdominal pain, diarrhea, nausea and vomiting. Negative for constipation.  Genitourinary: Negative for dysuria.  Musculoskeletal: Negative for joint pain.  Neurological: Negative for dizziness and headaches.   Exam: Physical Exam  Constitutional: He is oriented to person, place, and time.  HENT:  Nose: No mucosal edema.  Mouth/Throat: No oropharyngeal exudate or posterior oropharyngeal edema.  Eyes: Conjunctivae, EOM and lids are normal. Pupils are equal, round, and reactive to light.  Neck: No JVD present. Carotid bruit is not present. No edema present. No thyroid mass and no thyromegaly present.  Cardiovascular: S1 normal and S2 normal.  Exam reveals no gallop.   No murmur heard. Pulses:      Dorsalis pedis pulses are 2+ on the right side, and 2+ on the left side.  Respiratory: No respiratory distress. He has no wheezes. He has no  rhonchi. He has no rales.  GI: Soft. Bowel sounds are normal. He exhibits distension, fluid wave and ascites. There is tenderness in the suprapubic area and left lower quadrant.  Musculoskeletal:       Right ankle: He exhibits no swelling.       Left ankle: He exhibits no swelling.  Lymphadenopathy:    He has no cervical adenopathy.  Neurological: He is alert and oriented to person, place, and time. No cranial nerve deficit.  Skin: Skin is warm. No rash noted. Nails show no clubbing.  Psychiatric: He has a normal mood and affect.      Data Reviewed: Basic Metabolic Panel:  Recent Labs Lab 09/26/16 2338 09/27/16 0532 09/28/16 0230 09/29/16 0439  NA 137 137 134* 134*  K 3.5 3.8 3.2* 3.5  CL 103 106 100* 100*  CO2 25 23 28 27   GLUCOSE 116* 109* 100* 127*  BUN 6 7 6 7   CREATININE 0.72 0.56* 0.54* 0.60*  CALCIUM 8.0* 7.8* 7.4* 8.0*  MG  --  1.7  --   --   PHOS  --  3.9  --   --    Liver Function Tests:  Recent Labs Lab 09/26/16 2338  AST 85*  ALT 33  ALKPHOS 65  BILITOT 2.0*  PROT 7.0  ALBUMIN 2.9*    Recent Labs Lab 09/26/16 2338  LIPASE 30   CBC:  Recent Labs Lab 09/26/16 2338 09/27/16 0532 09/27/16 1005 09/27/16 1840 09/28/16 0230 09/28/16 1000 09/29/16 0439  WBC 5.5 4.1  --   --  2.6*  --  3.5*  HGB 14.3 13.5 14.0 13.0 12.0* 13.3 14.8  HCT 42.2 39.6* 40.6 38.1* 35.4* 38.3* 43.9  MCV 98.9 98.0  --   --  98.8  --  99.0  PLT 127* 118*  --   --  71*  --  81*     Studies: No results found.  Scheduled Meds: . cefTRIAXone  1 g Intravenous Daily  . folic acid  1 mg Oral Daily  . lactulose  30 g Oral BID  . LORazepam  0-4 mg Oral Q12H  . multivitamin with minerals  1 tablet Oral Daily  . pantoprazole  40 mg Intravenous Q12H  . rifaximin  550 mg Oral BID  . sodium chloride flush  3 mL Intravenous Q12H  . sodium phosphate  1 enema Rectal BID  . spironolactone  100 mg Oral Daily  . thiamine  100 mg Oral Daily    Assessment/Plan:  1. Severe  lower abdominal pain. CT scan unremarkable. Try to do oral pain medications, with oral Dilaudid. No labs were sent off with paracentesis. Start empiric Rocephin in case SBP. 2. Nausea vomiting hematemesis. Hemoglobin stable. No need for endoscopy 3. Ascites.  Paracentesis at 13.275 L removed. Likely will end up needing repeated paracentesis as outpatient. Hold Lasix today. already on spironolactone.  Empiric rocephin. 4. 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 Patient has  Thrombocytopenia and leukopenia. Patient has an elevated PT also secondary to liver disease. Try to add low dose nadolol at night.  Code Status:     Code Status Orders        Start     Ordered   09/27/16 0251  Full code  Continuous     09/27/16 0251    Code Status History    Date Active Date Inactive Code Status Order ID Comments User Context   09/27/2016  2:51 AM 09/27/2016  2:51 AM Full Code [id number]  Tonye RoyaltyAlexis Hugelmeyer, DO Inpatient      Disposition Plan: once pain controlled can go home  Antibiotics:  rocephin  Time spent: 26 minutes  Alford HighlandWIETING, Vaniya Augspurger  [employer]

## 2016-10-01 ENCOUNTER — Inpatient Hospital Stay: Payer: Medicaid Other

## 2016-10-01 ENCOUNTER — Ambulatory Visit: Payer: Self-pay | Admitting: Gastroenterology

## 2016-10-01 DIAGNOSIS — R1084 Generalized abdominal pain: Secondary | ICD-10-CM

## 2016-10-01 DIAGNOSIS — K7031 Alcoholic cirrhosis of liver with ascites: Principal | ICD-10-CM

## 2016-10-01 LAB — BODY FLUID CELL COUNT WITH DIFFERENTIAL
Eos, Fluid: 0 %
Lymphs, Fluid: 52 %
Monocyte-Macrophage-Serous Fluid: 31 %
Neutrophil Count, Fluid: 17 %
Other Cells, Fluid: 0 %
WBC FLUID: 382 uL

## 2016-10-01 LAB — BASIC METABOLIC PANEL
Anion gap: 6 (ref 5–15)
BUN: 17 mg/dL (ref 6–20)
CALCIUM: 8 mg/dL — AB (ref 8.9–10.3)
CO2: 28 mmol/L (ref 22–32)
CREATININE: 0.68 mg/dL (ref 0.61–1.24)
Chloride: 100 mmol/L — ABNORMAL LOW (ref 101–111)
GFR calc non Af Amer: >60 mL/min (ref >60)
Glucose, Bld: 100 mg/dL — ABNORMAL HIGH (ref 65–99)
Potassium: 3.8 mmol/L (ref 3.5–5.1)
SODIUM: 134 mmol/L — AB (ref 135–145)

## 2016-10-01 LAB — GLUCOSE, PERITONEAL FLUID: Glucose, Peritoneal Fluid: 123 mg/dL

## 2016-10-01 LAB — PROTEIN, BODY FLUID

## 2016-10-01 MED ORDER — FUROSEMIDE 40 MG PO TABS
40.0000 mg | ORAL_TABLET | Freq: Two times a day (BID) | ORAL | Status: DC
  Administered 2016-10-01 – 2016-10-03 (×5): 40 mg via ORAL
  Filled 2016-10-01 (×5): qty 1

## 2016-10-01 MED ORDER — CEFTRIAXONE SODIUM-DEXTROSE 2-2.22 GM-% IV SOLR
2.0000 g | Freq: Every day | INTRAVENOUS | Status: DC
  Administered 2016-10-02 – 2016-10-03 (×2): 2 g via INTRAVENOUS
  Filled 2016-10-01 (×3): qty 50

## 2016-10-01 NOTE — Progress Notes (Signed)
Pt alert but in discomfort. Medical issues not yet diagnosed but working toward assessment. 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 Girlfriend is local. [facility] offered prayer.   10/01/16 1050  Clinical Encounter Type  Visited With Patient  Visit Type Initial  Referral From Nurse  Spiritual Encounters  Spiritual Needs Prayer;Emotional  Stress Factors  Patient Stress Factors Health changes

## 2016-10-01 NOTE — Progress Notes (Signed)
Antonio Miniumarren [name], MD [facility]   [address] [place], [place] [address] Phone: [phone] Fax : [phone]   Subjective: The patient has a history of hepatitis C and alcoholism with cirrhosis. The patient was admitted with ascites. The patient had the fluid sent off today with a white cell count of 350 but the neutrophils were only 17% which is not consistent with this being SBP. He reports that he has abdominal pain which is better since he had the paracentesis. The patient is presently on 40 mg of Lasix and 100 mg of Aldactone. He continues to have a normal creatinine and his ascites continues to accumulate.   Objective: Vital signs in last 24 hours: Vitals:   10/01/16 1420 10/01/16 1442 10/01/16 1452 10/01/16 1602  BP: 108/79 114/70 108/75 119/67  Pulse: 70 66 65 67  Resp: 20 20 14 14   Temp:    97.7 F (36.5 C)  TempSrc:    Oral  SpO2: 92% 93% 96% 97%  Weight:      Height:       Weight change:   Intake/Output Summary (Last 24 hours) at 10/01/16 1659 Last data filed at 10/01/16 1300  Gross per 24 hour  Intake             1310 ml  Output              301 ml  Net             1009 ml     Exam: Heart:: Regular rate and rhythm Lungs: normal Abdomen: Distended with positive shifting dullness and fluid wave consistent with ascites. Also tender. Extremities: Without cyanosis clubbing or edema   Lab Results: @LABTEST2 @ Micro Results: No results found for this or any previous visit (from the past 240 hour(s)). Studies/Results: [place] Paracentesis  Result Date: 10/01/2016 INDICATION: Recurrent ascites.  Spontaneous bacterial peritonitis. EXAM: ULTRASOUND GUIDED PARACENTESIS MEDICATIONS: None. COMPLICATIONS: None immediate. PROCEDURE: Informed written consent was obtained from the patient after a discussion of the risks, benefits and alternatives to treatment. A timeout was performed prior to the initiation of the procedure. Initial ultrasound scanning demonstrates a large  amount of ascites within the right lower abdominal quadrant. The right lower abdomen was prepped and draped in the usual sterile fashion. 1% lidocaine was used for local anesthesia. Following this, a 6 Fr Safe-T-Centesis catheter was introduced. An ultrasound image was saved for documentation purposes. The paracentesis was performed. The catheter was removed and a dressing was applied. The patient tolerated the procedure well without immediate post procedural complication. FINDINGS: A total of approximately 3.1 L of yellow fluid was removed. Samples were sent to the laboratory as requested by the clinical team. IMPRESSION: Successful ultrasound-guided paracentesis yielding 3.1 liters of peritoneal fluid. Electronically Signed   By: Richarda OverlieAdam  Henn M.D.   On: 10/01/2016 15:15   Medications: I have reviewed the patient's current medications. Scheduled Meds: . [START ON 10/02/2016] cefTRIAXone  2 g Intravenous Daily  . folic acid  1 mg Oral Daily  . furosemide  40 mg Oral Daily  . lactulose  30 g Oral BID  . multivitamin with minerals  1 tablet Oral Daily  . nadolol  20 mg Oral QHS  . pantoprazole  40 mg Intravenous Q12H  . rifaximin  550 mg Oral BID  . sodium chloride flush  3 mL Intravenous Q12H  . spironolactone  100 mg Oral Daily  . thiamine  100 mg Oral Daily   Continuous Infusions: PRN  Meds:.sodium chloride, acetaminophen **OR** acetaminophen, bisacodyl, HYDROmorphone, morphine injection, ondansetron **OR** ondansetron (ZOFRAN) IV, senna-docusate, sodium chloride flush, zolpidem   Assessment: Active Problems:   Upper GI bleed   GI bleed    Plan: This patient has a stable hemoglobin with a paracentesis not consistent with SBP. The patient's diuretics need to be optimized and I will increase his Lasix to 40 mg twice a day. The patient has been told to continue his abstinence from alcohol which he states has been for the last month. The patient has also been told to enter rehabilitation if he  would like to be considered for a liver transplant in the future.   LOS: 4 days   Antonio Ryan 10/01/2016, 4:59 PM

## 2016-10-01 NOTE — Progress Notes (Signed)
Patient ID: Antonio Ryan, male   DOB: 04/01/1956, 60 y.o.   MRN: [id number]  [facility] PROGRESS NOTE  Antonio Ryan UYQ:[id number] DOB: 03/04/1956 DOA: 09/26/2016 PCP: No PCP Per Patient  HPI/Subjective: Patient states he still having 10 out of 10 abdominal pain. Not feeling well. Tolerating liquid diet. He thinks he feels a little bit better after antibiotics.  Objective: Vitals:   10/01/16 1442 10/01/16 1452  BP: 114/70 108/75  Pulse: 66 65  Resp: 20 14  Temp:      Filed Weights   09/26/16 2331 09/27/16 0300  Weight: 104.3 kg (230 lb) 106.4 kg (234 lb 9.6 oz)    ROS: Review of Systems  Constitutional: Negative for chills and fever.  Eyes: Negative for blurred vision.  Respiratory: Negative for cough and shortness of breath.   Cardiovascular: Negative for chest pain.  Gastrointestinal: Positive for abdominal pain and diarrhea. Negative for constipation, nausea and vomiting.  Genitourinary: Negative for dysuria.  Musculoskeletal: Negative for joint pain.  Neurological: Negative for dizziness and headaches.   Exam: Physical Exam  Constitutional: He is oriented to person, place, and time.  HENT:  Nose: No mucosal edema.  Mouth/Throat: No oropharyngeal exudate or posterior oropharyngeal edema.  Eyes: Conjunctivae, EOM and lids are normal. Pupils are equal, round, and reactive to light.  Neck: No JVD present. Carotid bruit is not present. No edema present. No thyroid mass and no thyromegaly present.  Cardiovascular: S1 normal and S2 normal.  Exam reveals no gallop.   No murmur heard. Pulses:      Dorsalis pedis pulses are 2+ on the right side, and 2+ on the left side.  Respiratory: No respiratory distress. He has no wheezes. He has no rhonchi. He has no rales.  GI: Soft. Bowel sounds are normal. He exhibits distension, fluid wave and ascites. There is tenderness in the suprapubic area and left lower quadrant.  Musculoskeletal:       Right ankle: He exhibits no  swelling.       Left ankle: He exhibits no swelling.  Lymphadenopathy:    He has no cervical adenopathy.  Neurological: He is alert and oriented to person, place, and time. No cranial nerve deficit.  Skin: Skin is warm. No rash noted. Nails show no clubbing.  Psychiatric: He has a normal mood and affect.      Data Reviewed: Basic Metabolic Panel:  Recent Labs Lab 09/26/16 2338 09/27/16 0532 09/28/16 0230 09/29/16 0439 10/01/16 0425  NA 137 137 134* 134* 134*  K 3.5 3.8 3.2* 3.5 3.8  CL 103 106 100* 100* 100*  CO2 25 23 28 27 28   GLUCOSE 116* 109* 100* 127* 100*  BUN 6 7 6 7 17   CREATININE 0.72 0.56* 0.54* 0.60* 0.68  CALCIUM 8.0* 7.8* 7.4* 8.0* 8.0*  MG  --  1.7  --   --   --   PHOS  --  3.9  --   --   --    Liver Function Tests:  Recent Labs Lab 09/26/16 2338  AST 85*  ALT 33  ALKPHOS 65  BILITOT 2.0*  PROT 7.0  ALBUMIN 2.9*    Recent Labs Lab 09/26/16 2338  LIPASE 30   CBC:  Recent Labs Lab 09/26/16 2338 09/27/16 0532 09/27/16 1005 09/27/16 1840 09/28/16 0230 09/28/16 1000 09/29/16 0439  WBC 5.5 4.1  --   --  2.6*  --  3.5*  HGB 14.3 13.5 14.0 13.0 12.0* 13.3 14.8  HCT 42.2 39.6* 40.6 38.1*  35.4* 38.3* 43.9  MCV 98.9 98.0  --   --  98.8  --  99.0  PLT 127* 118*  --   --  71*  --  81*     Studies: [place] Paracentesis  Result Date: 10/01/2016 INDICATION: Recurrent ascites.  Spontaneous bacterial peritonitis. EXAM: ULTRASOUND GUIDED PARACENTESIS MEDICATIONS: None. COMPLICATIONS: None immediate. PROCEDURE: Informed written consent was obtained from the patient after a discussion of the risks, benefits and alternatives to treatment. A timeout was performed prior to the initiation of the procedure. Initial ultrasound scanning demonstrates a large amount of ascites within the right lower abdominal quadrant. The right lower abdomen was prepped and draped in the usual sterile fashion. 1% lidocaine was used for local anesthesia. Following this, a 6 Fr  Safe-T-Centesis catheter was introduced. An ultrasound image was saved for documentation purposes. The paracentesis was performed. The catheter was removed and a dressing was applied. The patient tolerated the procedure well without immediate post procedural complication. FINDINGS: A total of approximately 3.1 L of yellow fluid was removed. Samples were sent to the laboratory as requested by the clinical team. IMPRESSION: Successful ultrasound-guided paracentesis yielding 3.1 liters of peritoneal fluid. Electronically Signed   By: Richarda OverlieAdam  Henn M.D.   On: 10/01/2016 15:15    Scheduled Meds: . [START ON 10/02/2016] cefTRIAXone  2 g Intravenous Daily  . folic acid  1 mg Oral Daily  . furosemide  40 mg Oral Daily  . lactulose  30 g Oral BID  . multivitamin with minerals  1 tablet Oral Daily  . nadolol  20 mg Oral QHS  . pantoprazole  40 mg Intravenous Q12H  . rifaximin  550 mg Oral BID  . sodium chloride flush  3 mL Intravenous Q12H  . spironolactone  100 mg Oral Daily  . thiamine  100 mg Oral Daily    Assessment/Plan:  1. Severe abdominal pain. CT scan unremarkable. Try to do oral pain medications, with oral Dilaudid. Started empiric Rocephin in case SBP. I ordered another paracentesis today and will send off labs to see if this is SBP or not. Laboratory data from fluid may be negative because the patient received 3 doses of antibiotic already. 2. Nausea vomiting hematemesis. Hemoglobin stable. No need for endoscopy 3. Ascites.  Paracentesis at 13.275 L removed previously. Another 3.1 L removed today. Likely will end up needing repeated paracentesis as outpatient. Restart oral Lasix today. Already on spironolactone.  Empiric rocephin. 4. 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 Patient has  Thrombocytopenia and leukopenia. Patient has an elevated PT also secondary to liver disease. Added low dose nadolol at night.  Code Status:     Code Status Orders        Start     Ordered    09/27/16 0251  Full code  Continuous     09/27/16 0251    Code Status History    Date Active Date Inactive Code Status Order ID Comments User Context   09/27/2016  2:51 AM 09/27/2016  2:51 AM Full Code [id number]  Tonye RoyaltyAlexis Hugelmeyer, DO Inpatient      Disposition Plan: once pain controlled can go home  Antibiotics:  rocephin  Time spent: 24 minutes  Alford HighlandWIETING, Chenise Mulvihill  [employer]

## 2016-10-01 NOTE — Procedures (Signed)
US guided paracentesis.  Yielded 3.1 liters of yellow fluid.  No immediate complication.  See full report in Imaging report.

## 2016-10-02 DIAGNOSIS — K652 Spontaneous bacterial peritonitis: Secondary | ICD-10-CM

## 2016-10-02 LAB — MISC LABCORP TEST (SEND OUT): LABCORP TEST CODE: 19588

## 2016-10-02 LAB — BASIC METABOLIC PANEL
Anion gap: 5 (ref 5–15)
BUN: 14 mg/dL (ref 6–20)
CALCIUM: 7.6 mg/dL — AB (ref 8.9–10.3)
CHLORIDE: 95 mmol/L — AB (ref 101–111)
CO2: 26 mmol/L (ref 22–32)
CREATININE: 0.65 mg/dL (ref 0.61–1.24)
GFR calc non Af Amer: >60 mL/min (ref >60)
Glucose, Bld: 110 mg/dL — ABNORMAL HIGH (ref 65–99)
Potassium: 3.8 mmol/L (ref 3.5–5.1)
Sodium: 126 mmol/L — ABNORMAL LOW (ref 135–145)

## 2016-10-02 LAB — PATHOLOGIST SMEAR REVIEW

## 2016-10-02 NOTE — Progress Notes (Signed)
Antonio Miniumarren Sacha Radloff, MD [facility]   [address] [place], [place] [address] Phone: [phone] Fax : [phone]   Subjective: The patient reports that his abdominal pain is much less and he feels that the antibiotics are helping him. The patient's Lasix has been doubled and his creatinine is normal.   Objective: Vital signs in last 24 hours: Vitals:   10/01/16 1452 10/01/16 1602 10/01/16 2031 10/02/16 0529  BP: 108/75 119/67 134/71 114/68  Pulse: 65 67 71 73  Resp: 14 14 20 18   Temp:  97.7 F (36.5 C) 97.5 F (36.4 C) 98.3 F (36.8 C)  TempSrc:  Oral Oral Oral  SpO2: 96% 97% 96% 94%  Weight:      Height:       Weight change:   Intake/Output Summary (Last 24 hours) at 10/02/16 1123 Last data filed at 10/02/16 1014  Gross per 24 hour  Intake              963 ml  Output                0 ml  Net              963 ml     Exam: Heart:: Regular rate and rhythm Lungs: normal Abdomen: Distended with positive ascites, tense without rebound or guarding. Extremities: Without cyanosis clubbing or edema   Lab Results: @LABTEST2 @ Micro Results: Recent Results (from the past 240 hour(s))  Body fluid culture     Status: None (Preliminary result)   Collection Time: 10/01/16  2:37 PM  Result Value Ref Range Status   Specimen Description PERITONEAL  Final   Special Requests NONE  Final   Gram Stain   Final    ABUNDANT WBC PRESENT, PREDOMINANTLY MONONUCLEAR NO ORGANISMS SEEN    Culture   Final    NO GROWTH < 24 HOURS [facility]    Report Status PENDING  Incomplete   Studies/Results: [place] Paracentesis  Result Date: 10/01/2016 INDICATION: Recurrent ascites.  Spontaneous bacterial peritonitis. EXAM: ULTRASOUND GUIDED PARACENTESIS MEDICATIONS: None. COMPLICATIONS: None immediate. PROCEDURE: Informed written consent was obtained from the patient after a discussion of the risks, benefits and alternatives to treatment. A timeout was performed prior to the  initiation of the procedure. Initial ultrasound scanning demonstrates a large amount of ascites within the right lower abdominal quadrant. The right lower abdomen was prepped and draped in the usual sterile fashion. 1% lidocaine was used for local anesthesia. Following this, a 6 Fr Safe-T-Centesis catheter was introduced. An ultrasound image was saved for documentation purposes. The paracentesis was performed. The catheter was removed and a dressing was applied. The patient tolerated the procedure well without immediate post procedural complication. FINDINGS: A total of approximately 3.1 L of yellow fluid was removed. Samples were sent to the laboratory as requested by the clinical team. IMPRESSION: Successful ultrasound-guided paracentesis yielding 3.1 liters of peritoneal fluid. Electronically Signed   By: Richarda OverlieAdam  Henn M.D.   On: 10/01/2016 15:15   Medications: I have reviewed the patient's current medications. Scheduled Meds: . cefTRIAXone  2 g Intravenous Daily  . folic acid  1 mg Oral Daily  . furosemide  40 mg Oral BID  . lactulose  30 g Oral BID  . multivitamin with minerals  1 tablet Oral Daily  . nadolol  20 mg Oral QHS  . pantoprazole  40 mg Intravenous Q12H  . rifaximin  550 mg Oral BID  . sodium chloride flush  3 mL Intravenous Q12H  . spironolactone  100 mg Oral Daily  . thiamine  100 mg Oral Daily   Continuous Infusions: PRN Meds:.sodium chloride, acetaminophen **OR** acetaminophen, bisacodyl, HYDROmorphone, morphine injection, ondansetron **OR** ondansetron (ZOFRAN) IV, senna-docusate, sodium chloride flush, zolpidem   Assessment: Active Problems:   Upper GI bleed   GI bleed   Ascites due to alcoholic cirrhosis (HCC)   Generalized abdominal pain    Plan: Continue antibiotics for possible SBP despite a negative white cell count on the fluid taken it was sampled after the patient had been on antibiotics. The patient is tolerating liquids and feels less abdominal pain. Follow  his creatinine to see if his diuretics can be increased.   LOS: 5 days   Antonio MiniumDarren Laterria [place]ota 10/02/2016, 11:23 AM

## 2016-10-02 NOTE — Evaluation (Signed)
Physical Therapy Evaluation Patient Details Name: Antonio Ryan MRN: [id number] DOB: 04/17/1956 Today's Date: 10/02/2016   History of Present Illness  Pt admitted for upper GI bleed. Pt with complaints of abdominal pain and nausea along with abdominal distension. Pt with history of hep C, GERD, and spinal stenosis. Pt also had parentesis yesterday.  Clinical Impression  Pt is a pleasant 60 year old male who was admitted for upper GI bleed. Pt is now s/p paracentesis. Pt demonstrates all bed mobility/transfers/ambulation at baseline level and uses SPC for all mobility. Pt does not require any further PT needs at this time. Pt will be dc in house and does not require follow up. RN aware. Will dc current orders.      Follow Up Recommendations No PT follow up    Equipment Recommendations  None recommended by PT    Recommendations for Other Services       Precautions / Restrictions Precautions Precautions: Fall Restrictions Weight Bearing Restrictions: No      Mobility  Bed Mobility Overal bed mobility: Independent             General bed mobility comments: safe technique performed without cues for assistance  Transfers Overall transfer level: Modified independent Equipment used: Straight cane             General transfer comment: transfers performed with SPC and safe technique. No LOB noted.  Ambulation/Gait Ambulation/Gait assistance: Supervision Ambulation Distance (Feet): 80 Feet Assistive device: Straight cane Gait Pattern/deviations: Step-through pattern     General Gait Details: ambulated using reciprocal gait pattern. SPC used for balance with no LOB noted. Slight increase in lateral sway noted.  Stairs            Wheelchair Mobility    Modified Rankin (Stroke Patients Only)       Balance Overall balance assessment: Needs assistance Sitting-balance support: Feet supported Sitting balance-Leahy Scale: Normal     Standing balance  support: Single extremity supported Standing balance-Leahy Scale: Good                               Pertinent Vitals/Pain Pain Assessment: 0-10 Pain Score: 7  Pain Location: abdomen Pain Descriptors / Indicators: Discomfort;Dull Pain Intervention(s): Limited activity within patient's tolerance    Home Living Family/patient expects to be discharged to:: Private residence Living Arrangements: Spouse/significant other Available Help at Discharge:  (girlfriend works during day/home at night) Type of Home: House Home Access: Level entry     Home Layout: One level Home Equipment: Environmental consultantWalker - 2 wheels;Cane - single point      Prior Function Level of Independence: Independent with assistive device(s)         Comments: uses SPC     Hand Dominance        Extremity/Trunk Assessment   Upper Extremity Assessment: Overall WFL for tasks assessed           Lower Extremity Assessment: Generalized weakness (B LE grossly 4+/5)         Communication   Communication: No difficulties  Cognition Arousal/Alertness: Awake/alert Behavior During Therapy: WFL for tasks assessed/performed Overall Cognitive Status: Within Functional Limits for tasks assessed                      General Comments      Exercises     Assessment/Plan    PT Assessment Patent does not need any further PT services  PT Problem List            PT Treatment Interventions      PT Goals (Current goals can be found in the Care Plan section)  Acute Rehab PT Goals Patient Stated Goal: to be pain free PT Goal Formulation: With patient Time For Goal Achievement: 10/02/16 Potential to Achieve Goals: Good    Frequency     Barriers to discharge        Co-evaluation               End of Session Equipment Utilized During Treatment: Gait belt Activity Tolerance: Patient tolerated treatment well Patient left: in bed Nurse Communication: Mobility status          Time: [id number] PT Time Calculation (min) (ACUTE ONLY): 8 min   Charges:   PT Evaluation $PT Eval Low Complexity: 1 Procedure     PT G Codes:        Henderson Frampton 10/02/2016, 4:57 PM  Elizabeth PalauStephanie Lita Flynn, PT, DPT [phone]

## 2016-10-02 NOTE — Progress Notes (Signed)
Patient ID: Antonio BrownsDonald Ryan, male   DOB: 02/08/1956, 60 y.o.   MRN: [id number]  [facility] PROGRESS NOTE  Antonio BrownsDonald Perona [id number] DOB: 01/07/1956 DOA: 09/26/2016 PCP: No PCP Per Patient  HPI/Subjective: Patient scared to go home. Patient states that he has 10 out of 10 abdominal pain every time I see him including today. He states he feels better than yesterday but still having pain.  Objective: Vitals:   10/02/16 0529 10/02/16 1318  BP: 114/68 122/68  Pulse: 73 72  Resp: 18 18  Temp: 98.3 F (36.8 C) 98.4 F (36.9 C)    Filed Weights   09/26/16 2331 09/27/16 0300  Weight: 104.3 kg (230 lb) 106.4 kg (234 lb 9.6 oz)    ROS: Review of Systems  Constitutional: Negative for chills and fever.  Eyes: Negative for blurred vision.  Respiratory: Negative for cough and shortness of breath.   Cardiovascular: Negative for chest pain.  Gastrointestinal: Positive for abdominal pain. Negative for constipation, nausea and vomiting.  Genitourinary: Negative for dysuria.  Musculoskeletal: Negative for joint pain.  Neurological: Negative for dizziness and headaches.   Exam: Physical Exam  Constitutional: He is oriented to person, place, and time.  HENT:  Nose: No mucosal edema.  Mouth/Throat: No oropharyngeal exudate or posterior oropharyngeal edema.  Eyes: Conjunctivae, EOM and lids are normal. Pupils are equal, round, and reactive to light.  Neck: No JVD present. Carotid bruit is not present. No edema present. No thyroid mass and no thyromegaly present.  Cardiovascular: S1 normal and S2 normal.  Exam reveals no gallop.   No murmur heard. Pulses:      Dorsalis pedis pulses are 2+ on the right side, and 2+ on the left side.  Respiratory: No respiratory distress. He has no wheezes. He has no rhonchi. He has no rales.  GI: Soft. Bowel sounds are normal. He exhibits distension, fluid wave and ascites. There is tenderness in the suprapubic area and left lower quadrant.   Musculoskeletal:       Right ankle: He exhibits no swelling.       Left ankle: He exhibits no swelling.  Lymphadenopathy:    He has no cervical adenopathy.  Neurological: He is alert and oriented to person, place, and time. No cranial nerve deficit.  Skin: Skin is warm. No rash noted. Nails show no clubbing.  Psychiatric: He has a normal mood and affect.      Data Reviewed: Basic Metabolic Panel:  Recent Labs Lab 09/27/16 0532 09/28/16 0230 09/29/16 0439 10/01/16 0425 10/02/16 0549  NA 137 134* 134* 134* 126*  K 3.8 3.2* 3.5 3.8 3.8  CL 106 100* 100* 100* 95*  CO2 23 28 27 28 26   GLUCOSE 109* 100* 127* 100* 110*  BUN 7 6 7 17 14   CREATININE 0.56* 0.54* 0.60* 0.68 0.65  CALCIUM 7.8* 7.4* 8.0* 8.0* 7.6*  MG 1.7  --   --   --   --   PHOS 3.9  --   --   --   --    Liver Function Tests:  Recent Labs Lab 09/26/16 2338  AST 85*  ALT 33  ALKPHOS 65  BILITOT 2.0*  PROT 7.0  ALBUMIN 2.9*    Recent Labs Lab 09/26/16 2338  LIPASE 30   CBC:  Recent Labs Lab 09/26/16 2338 09/27/16 0532 09/27/16 1005 09/27/16 1840 09/28/16 0230 09/28/16 1000 09/29/16 0439  WBC 5.5 4.1  --   --  2.6*  --  3.5*  HGB 14.3 13.5  14.0 13.0 12.0* 13.3 14.8  HCT 42.2 39.6* 40.6 38.1* 35.4* 38.3* 43.9  MCV 98.9 98.0  --   --  98.8  --  99.0  PLT 127* 118*  --   --  71*  --  81*     Studies: [place] Paracentesis  Result Date: 10/01/2016 INDICATION: Recurrent ascites.  Spontaneous bacterial peritonitis. EXAM: ULTRASOUND GUIDED PARACENTESIS MEDICATIONS: None. COMPLICATIONS: None immediate. PROCEDURE: Informed written consent was obtained from the patient after a discussion of the risks, benefits and alternatives to treatment. A timeout was performed prior to the initiation of the procedure. Initial ultrasound scanning demonstrates a large amount of ascites within the right lower abdominal quadrant. The right lower abdomen was prepped and draped in the usual sterile fashion. 1% lidocaine  was used for local anesthesia. Following this, a 6 Fr Safe-T-Centesis catheter was introduced. An ultrasound image was saved for documentation purposes. The paracentesis was performed. The catheter was removed and a dressing was applied. The patient tolerated the procedure well without immediate post procedural complication. FINDINGS: A total of approximately 3.1 L of yellow fluid was removed. Samples were sent to the laboratory as requested by the clinical team. IMPRESSION: Successful ultrasound-guided paracentesis yielding 3.1 liters of peritoneal fluid. Electronically Signed   By: Richarda OverlieAdam  Henn M.D.   On: 10/01/2016 15:15    Scheduled Meds: . cefTRIAXone  2 g Intravenous Daily  . folic acid  1 mg Oral Daily  . furosemide  40 mg Oral BID  . lactulose  30 g Oral BID  . multivitamin with minerals  1 tablet Oral Daily  . nadolol  20 mg Oral QHS  . pantoprazole  40 mg Intravenous Q12H  . rifaximin  550 mg Oral BID  . sodium chloride flush  3 mL Intravenous Q12H  . spironolactone  100 mg Oral Daily  . thiamine  100 mg Oral Daily    Assessment/Plan:  1. Spontaneous bacterial peritonitis with Severe abdominal pain. CT scan unremarkable. Try to do oral pain medications, with oral Dilaudid. Continue Rocephin. If pain better controlled tomorrow may be able to send home. 2. Nausea vomiting hematemesis. This has resolved 3. Ascites.  Paracentesis at 13.275 L removed previously. Another 3.1 L removed yesterday. Fluid reaccumulated already. Patient has refractory ascites and likely will need outpatient paracentesis every week or every 2 weeks. But this will have to be set up through Dr. Servando SnareWohl as outpatient. Continue oral Lasix and spironolactone.   4. 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 Patient has  Thrombocytopenia and leukopenia. Patient has an elevated PT also secondary to liver disease. Added low dose nadolol at night.  Code Status:     Code Status Orders        Start     Ordered    09/27/16 0251  Full code  Continuous     09/27/16 0251    Code Status History    Date Active Date Inactive Code Status Order ID Comments User Context   09/27/2016  2:51 AM 09/27/2016  2:51 AM Full Code [id number]  Tonye RoyaltyAlexis Hugelmeyer, DO Inpatient      Disposition Plan: I told the patient that he potentially can go home tomorrow.  Antibiotics:  rocephin  Time spent: 24 minutes  Alford HighlandWIETING, Savien Mamula  [employer]

## 2016-10-03 MED ORDER — CIPROFLOXACIN HCL 500 MG PO TABS: ORAL_TABLET | ORAL | 0 refills | Status: DC

## 2016-10-03 MED ORDER — THIAMINE HCL 100 MG PO TABS: 100.0000 mg | ORAL_TABLET | Freq: Every day | ORAL | 0 refills | Status: DC

## 2016-10-03 MED ORDER — LACTULOSE 10 GM/15ML PO SOLN: 30.0000 g | Freq: Two times a day (BID) | ORAL | 0 refills | Status: DC

## 2016-10-03 MED ORDER — HYDROMORPHONE HCL 2 MG PO TABS: ORAL_TABLET | ORAL | 0 refills | Status: DC

## 2016-10-03 MED ORDER — FUROSEMIDE 40 MG PO TABS: 40.0000 mg | ORAL_TABLET | Freq: Two times a day (BID) | ORAL | 0 refills | Status: DC

## 2016-10-03 MED ORDER — SPIRONOLACTONE 100 MG PO TABS: 100.0000 mg | ORAL_TABLET | Freq: Every day | ORAL | 0 refills | Status: DC

## 2016-10-03 MED ORDER — FOLIC ACID 1 MG PO TABS: 1.0000 mg | ORAL_TABLET | Freq: Every day | ORAL | 0 refills | Status: DC

## 2016-10-03 NOTE — Care Management (Signed)
Patient admitted with Spontaneous bacterial peritonitis.  Patient states that he lives at home with his girlfriend and draws social security.  Patient states that [place] from [place] and does not have PCP.  Patient has his hx of alcohol abuse.  Patient to discharge home toady.  Patient prescriptions were filled and delivered to patient from medication management.  Medication management unable to fill dilaudid thiamine.  Coupon from [employer] provided for dilaudid.  $7.60 out of pocket.  Patient states that him and his girlfriend do not have a car, however his neighbor is able to provide transportation on Mondays if needed.  [place] DSS information provided at patient request so he could pursue applying for Medicaid.  [facility] [place].  RNCM signing off

## 2016-10-03 NOTE — Discharge Summary (Signed)
[facility] - [place] at [facility]   PATIENT NAME: Antonio BrownsDonald Ryan    MR#:  [id number]  DATE OF BIRTH:  08/29/1956  DATE OF ADMISSION:  09/26/2016 ADMITTING PHYSICIAN: Tonye RoyaltyAlexis Hugelmeyer, DO  DATE OF DISCHARGE: 10/03/2016  PRIMARY CARE PHYSICIAN: [facility]   ADMISSION DIAGNOSIS:  Upper GI bleed [K92.2] Ascites due to alcoholic cirrhosis (HCC) [K70.31]  DISCHARGE DIAGNOSIS:  Active Problems:   Upper GI bleed   GI bleed   Ascites due to alcoholic cirrhosis (HCC)   Generalized abdominal pain   SBP (spontaneous bacterial peritonitis) (HCC)   SECONDARY DIAGNOSIS:   Past Medical History:  Diagnosis Date  . Acid reflux   . Cirrhosis (HCC)   . Hepatitis C   . Hypertension   . Neuropathy (HCC)   . Spinal stenosis     HOSPITAL COURSE:   1. Spontaneous bacterial peritonitis with severe abdominal pain and recurrent ascites. CT scan of the abdomen and pelvis was unremarkable. Patient was initially admitted on 09/27/2016. I saw the patient on 09/29/2016 and empirically started the patient on Rocephin. The patient had a paracentesison 09/28/2016 and at 13 L taken off. Unfortunately no labs were sent at that time.I had a repeat paracentesis done on 10/01/2016 which showed white blood cells of 382. This is a partially treated spontaneous bacterial peritonitis. Nothing grew out of the culture secondary to being on treatment for 3 days prior to the repeat paracentesis. Cipro will be prescribed twice a day for one week then once a day prophylactically afterwards. Patient given IV dilaudid taper to off as outpatient. 2. Hematemesis with nausea vomiting. This has resolved. Hemoglobin remained stable. Seen by GI and no indications for endoscopy at this point 3. Liver cirrhosis with ascites and with hepatitis C history and alcohol history. The patient also has thrombocytopenia and coagulopathy. I explained to the patient that in order to be a liver transplant candidate he  needs to stop drinking for at least a year. The patient was placed on Lasix and Aldactone for his recurrent ascites. Case discussed with Dr. Servando SnareWohl gastroenterology and he will have to follow-up with him or his associate as outpatient to set up for serial paracentesis. I spoke with the ultrasound Department and they will not receive a prescription from me because I'm not an outpatient doctor for the serial paracentesis. Beta blocker stop secondary to people with spontaneous bacterial peritonitis history have worse prognosis on the beta blocker.  DISCHARGE CONDITIONS:   Fair  CONSULTS OBTAINED:  Treatment Team:  Wyline MoodKiran Anna, MD  DRUG ALLERGIES:  No Known Allergies  DISCHARGE MEDICATIONS:   Current Discharge Medication List    START taking these medications   Details  ciprofloxacin (CIPRO) 500 MG tablet One tablet twice a day for one week then one tablet daily lifelong Qty: 37 tablet, Refills: 0    folic acid (FOLVITE) 1 MG tablet Take 1 tablet (1 mg total) by mouth daily. Qty: 30 tablet, Refills: 0    furosemide (LASIX) 40 MG tablet Take 1 tablet (40 mg total) by mouth 2 (two) times daily. Qty: 60 tablet, Refills: 0    HYDROmorphone (DILAUDID) 2 MG tablet 1 tab po every four hours for day1; 1 tab po every six hours for day2; 1 tab every eight hours day3; 1 tab every twelve hours day4; one tab daily for daily day5,6 Qty: 17 tablet, Refills: 0    lactulose (CHRONULAC) 10 GM/15ML solution Take 45 mLs (30 g total) by mouth 2 (two) times  daily. Qty: 1892 mL, Refills: 0    spironolactone (ALDACTONE) 100 MG tablet Take 1 tablet (100 mg total) by mouth daily. Qty: 30 tablet, Refills: 0    thiamine 100 MG tablet Take 1 tablet (100 mg total) by mouth daily. Qty: 30 tablet, Refills: 0      CONTINUE these medications which have NOT CHANGED   Details  omeprazole (PRILOSEC) 20 MG capsule Take 20 mg by mouth daily.      STOP taking these medications     ibuprofen (ADVIL,MOTRIN) 200 MG  tablet      lisinopril (PRINIVIL,ZESTRIL) 10 MG tablet      cephALEXin (KEFLEX) 500 MG capsule          DISCHARGE INSTRUCTIONS:   [facility] Follow-up with Dr. Tobi Bastos or Dr. Servando Snare one week  If you experience worsening of your admission symptoms, develop shortness of breath, life threatening emergency, suicidal or homicidal thoughts you must seek medical attention immediately by calling 911 or calling your MD immediately  if symptoms less severe.  You Must read complete instructions/literature along with all the possible adverse reactions/side effects for all the Medicines you take and that have been prescribed to you. Take any new Medicines after you have completely understood and accept all the possible adverse reactions/side effects.   Please note  You were cared for by a hospitalist during your hospital stay. If you have any questions about your discharge medications or the care you received while you were in the hospital after you are discharged, you can call the unit and asked to speak with the hospitalist on call if the hospitalist that took care of you is not available. Once you are discharged, your primary care physician will handle any further medical issues. Please note that NO REFILLS for any discharge medications will be authorized once you are discharged, as it is imperative that you return to your primary care physician (or establish a relationship with a primary care physician if you do not have one) for your aftercare needs so that they can reassess your need for medications and monitor your lab values.    Today   CHIEF COMPLAINT:   Chief Complaint  Patient presents with  . Abdominal Pain  . Nausea    HISTORY OF PRESENT ILLNESS:  Antonio Ryan  is a 60 y.o. male presented with nausea vomiting or abdominal pain   VITAL SIGNS:  Blood pressure 124/72, pulse 70, temperature 98 F (36.7 C), temperature source Oral, resp. rate 19,  height 6\' 1"  (1.854 m), weight 106.4 kg (234 lb 9.6 oz), SpO2 97 %.    PHYSICAL EXAMINATION:  GENERAL:  60 y.o.-year-old patient lying in the bed with no acute distress.  EYES: Pupils equal, round, reactive to light and accommodation. No scleral icterus. Extraocular muscles intact.  HEENT: Head atraumatic, normocephalic. Oropharynx and nasopharynx clear.  NECK:  Supple, no jugular venous distention. No thyroid enlargement, no tenderness.  LUNGS: Normal breath sounds bilaterally, no wheezing, rales,rhonchi or crepitation. No use of accessory muscles of respiration.  CARDIOVASCULAR: S1, S2 normal. No murmurs, rubs, or gallops.  ABDOMEN: Soft, Positive generalized tenderness as per patient. Positive for distention. Bowel sounds present. No organomegaly or mass.  EXTREMITIES: No pedal edema, cyanosis, or clubbing.  NEUROLOGIC: Cranial nerves II through XII are intact. Muscle strength 5/5 in all extremities. Sensation intact. Gait not checked.  PSYCHIATRIC: The patient is alert and oriented x 3.  SKIN: No obvious rash, lesion, or  ulcer.   DATA REVIEW:   CBC  Recent Labs Lab 09/29/16 0439  WBC 3.5*  HGB 14.8  HCT 43.9  PLT 81*    Chemistries   Recent Labs Lab 09/26/16 2338 09/27/16 0532  10/02/16 0549  NA 137 137  < > 126*  K 3.5 3.8  < > 3.8  CL 103 106  < > 95*  CO2 25 23  < > 26  GLUCOSE 116* 109*  < > 110*  BUN 6 7  < > 14  CREATININE 0.72 0.56*  < > 0.65  CALCIUM 8.0* 7.8*  < > 7.6*  MG  --  1.7  --   --   AST 85*  --   --   --   ALT 33  --   --   --   ALKPHOS 65  --   --   --   BILITOT 2.0*  --   --   --   < > = values in this interval not displayed.   Microbiology Results  Results for orders placed or performed during the hospital encounter of 09/26/16  Body fluid culture     Status: None (Preliminary result)   Collection Time: 10/01/16  2:37 PM  Result Value Ref Range Status   Specimen Description PERITONEAL  Final   Special Requests NONE  Final   Gram  Stain   Final    ABUNDANT WBC PRESENT, PREDOMINANTLY MONONUCLEAR NO ORGANISMS SEEN    Culture   Final    NO GROWTH 2 DA[facility]    Report Status PENDING  Incomplete      Management plans discussed with the patient, family and he is in agreement.  CODE STATUS:     Code Status Orders        Start     Ordered   09/27/16 0251  Full code  Continuous     09/27/16 0251    Code Status History    Date Active Date Inactive Code Status Order ID Comments User Context   09/27/2016  2:51 AM 09/27/2016  2:51 AM Full Code [id number]  Tonye RoyaltyAlexis Hugelmeyer, DO Inpatient      TOTAL TIME TAKING CARE OF THIS PATIENT: 35 minutes.    Alford HighlandWIETING, Kendrah Lovern M.D on 10/03/2016 at 4:56 PM  Between 7am to 6pm - Pager - [phone]  After 6pm go to [email or web address] - Social research officer, governmentpassword [email or web address]  [facility] Office  [phone]  CC: Primary care physician; [facility]

## 2016-10-03 NOTE — Progress Notes (Signed)
Pt d/c to home. Refused wheelchair. Left with prescriiptions and paperwork with girlfriend.

## 2016-10-05 LAB — BODY FLUID CULTURE: CULTURE: NO GROWTH

## 2016-10-29 ENCOUNTER — Ambulatory Visit: Payer: Self-pay | Admitting: Gastroenterology

## 2016-11-12 ENCOUNTER — Ambulatory Visit: Payer: Self-pay | Admitting: Pharmacy Technician

## 2016-11-12 NOTE — Progress Notes (Signed)
Patient scheduled for eligibility appointment at [facility].  Patient did not show for the appointment on 11/12/16 at 2:00p.m.  Patient did not reschedule eligibility appointment.  Sherilyn DacostaBetty J. Keithon Mccoin Care Manager [facility]

## 2016-12-05 ENCOUNTER — Emergency Department
Admission: EM | Admit: 2016-12-05 | Discharge: 2016-12-05 | Discharge status: Against Medical Advice | Payer: Medicaid Other | Attending: Emergency Medicine | Admitting: Emergency Medicine

## 2016-12-05 ENCOUNTER — Encounter: Payer: Self-pay | Admitting: Medical Oncology

## 2016-12-05 DIAGNOSIS — R1084 Generalized abdominal pain: Secondary | ICD-10-CM | POA: Diagnosis present

## 2016-12-05 DIAGNOSIS — Z760 Encounter for issue of repeat prescription: Secondary | ICD-10-CM | POA: Insufficient documentation

## 2016-12-05 DIAGNOSIS — R109 Unspecified abdominal pain: Secondary | ICD-10-CM

## 2016-12-05 DIAGNOSIS — Z79899 Other long term (current) drug therapy: Secondary | ICD-10-CM | POA: Insufficient documentation

## 2016-12-05 DIAGNOSIS — I10 Essential (primary) hypertension: Secondary | ICD-10-CM | POA: Insufficient documentation

## 2016-12-05 DIAGNOSIS — K7031 Alcoholic cirrhosis of liver with ascites: Secondary | ICD-10-CM | POA: Diagnosis not present

## 2016-12-05 DIAGNOSIS — Z87891 Personal history of nicotine dependence: Secondary | ICD-10-CM | POA: Insufficient documentation

## 2016-12-05 LAB — CBC
HCT: 40.1 % (ref 40.0–52.0)
Hemoglobin: 13.7 g/dL (ref 13.0–18.0)
MCH: 34.9 pg — AB (ref 26.0–34.0)
MCHC: 34.2 g/dL (ref 32.0–36.0)
MCV: 102.1 fL — AB (ref 80.0–100.0)
PLATELETS: 162 10*3/uL (ref 150–440)
RBC: 3.93 MIL/uL — ABNORMAL LOW (ref 4.40–5.90)
RDW: 13.9 % (ref 11.5–14.5)
WBC: 4.7 10*3/uL (ref 3.8–10.6)

## 2016-12-05 LAB — URINALYSIS, COMPLETE (UACMP) WITH MICROSCOPIC
Bacteria, UA: NONE SEEN
Bilirubin Urine: NEGATIVE
Glucose, UA: NEGATIVE mg/dL
Hgb urine dipstick: NEGATIVE
Ketones, ur: NEGATIVE mg/dL
Leukocytes, UA: NEGATIVE
Nitrite: NEGATIVE
PH: 5 (ref 5.0–8.0)
Protein, ur: NEGATIVE mg/dL
RBC / HPF: NONE SEEN RBC/hpf (ref 0–5)
SPECIFIC GRAVITY, URINE: 1.02 (ref 1.005–1.030)

## 2016-12-05 LAB — COMPREHENSIVE METABOLIC PANEL
ALT: 39 U/L (ref 17–63)
AST: 107 U/L — AB (ref 15–41)
Albumin: 3 g/dL — ABNORMAL LOW (ref 3.5–5.0)
Alkaline Phosphatase: 62 U/L (ref 38–126)
Anion gap: 8 (ref 5–15)
BUN: 6 mg/dL (ref 6–20)
CO2: 29 mmol/L (ref 22–32)
CREATININE: 0.71 mg/dL (ref 0.61–1.24)
Calcium: 8.2 mg/dL — ABNORMAL LOW (ref 8.9–10.3)
Chloride: 99 mmol/L — ABNORMAL LOW (ref 101–111)
GFR calc Af Amer: >60 mL/min (ref >60)
GFR calc non Af Amer: >60 mL/min (ref >60)
GLUCOSE: 124 mg/dL — AB (ref 65–99)
Potassium: 4.3 mmol/L (ref 3.5–5.1)
SODIUM: 136 mmol/L (ref 135–145)
Total Bilirubin: 1.6 mg/dL — ABNORMAL HIGH (ref 0.3–1.2)
Total Protein: 7.2 g/dL (ref 6.5–8.1)

## 2016-12-05 LAB — LIPASE, BLOOD: LIPASE: 45 U/L (ref 11–51)

## 2016-12-05 MED ORDER — SPIRONOLACTONE 100 MG PO TABS
100.0000 mg | ORAL_TABLET | ORAL | Status: AC
  Administered 2016-12-05: 100 mg via ORAL
  Filled 2016-12-05: qty 1

## 2016-12-05 MED ORDER — THIAMINE HCL 100 MG PO TABS: 100.0000 mg | ORAL_TABLET | Freq: Every day | ORAL | 0 refills | Status: DC

## 2016-12-05 MED ORDER — FUROSEMIDE 40 MG PO TABS
40.0000 mg | ORAL_TABLET | ORAL | Status: AC
  Administered 2016-12-05: 40 mg via ORAL
  Filled 2016-12-05: qty 1

## 2016-12-05 MED ORDER — FOLIC ACID 1 MG PO TABS: 1.0000 mg | ORAL_TABLET | Freq: Every day | ORAL | 0 refills | Status: DC

## 2016-12-05 MED ORDER — CIPROFLOXACIN HCL 500 MG PO TABS: 500.0000 mg | ORAL_TABLET | Freq: Every day | ORAL | 0 refills | Status: DC

## 2016-12-05 MED ORDER — SPIRONOLACTONE 100 MG PO TABS: 100.0000 mg | ORAL_TABLET | Freq: Every day | ORAL | 0 refills | Status: DC

## 2016-12-05 MED ORDER — LACTULOSE 10 GM/15ML PO SOLN: 30.0000 g | Freq: Two times a day (BID) | ORAL | 0 refills | Status: DC

## 2016-12-05 MED ORDER — FUROSEMIDE 40 MG PO TABS: 40.0000 mg | ORAL_TABLET | Freq: Two times a day (BID) | ORAL | 0 refills | Status: DC

## 2016-12-05 NOTE — ED Triage Notes (Signed)
Pt reports that he has cirrhosis of the liver and every month he is scheduled to have paracentesis, last moth his swelling wasn't bad so he missed and now swelling has worsened.

## 2016-12-05 NOTE — Discharge Instructions (Signed)
Take all medications as prescribed. Make an appointment in follow-up with the medication management clinic to continue your medications. Make appointment and follow up with gastrology clinic. The clinic should be scheduling you an appointment with Dr. Tobi Bastos on Monday or Tuesday.

## 2016-12-05 NOTE — ED Provider Notes (Signed)
[facility] Emergency Department Provider Note  ____________________________________________  Time seen: Approximately 11:54 AM  I have reviewed the triage vital signs and the nursing notes.   HISTORY  Chief Complaint Edema    HPI Antonio Ryan is a 61 y.o. male ho complains of generalized abdominal pain, gradually worsening over the past 2 weeks. He has a history of alcoholic liver cirrhosis. He was hospitalized in December where he had paracentesis and was started on medications including lactulose and spironolactone. He took these medications until they ran out. He did not follow-up in the medication management clinic to have them refilled and continued. He did not follow-up with GI in January to have a serial paracentesis. Now since running out of medications, his ascites has rapidly accumulated over the past 2 weeks.He reports feeling short of breath when he lies supine and having generalized abdominal discomfort due to the stretching but denies fever or chills.No vomiting. Normal oral intake. No bleeding.     Past Medical History:  Diagnosis Date  . Acid reflux   . Cirrhosis (HCC)   . Hepatitis C   . Hypertension   . Neuropathy (HCC)   . Spinal stenosis      Patient Active Problem List   Diagnosis Date Noted  . SBP (spontaneous bacterial peritonitis) (HCC)   . Ascites due to alcoholic cirrhosis (HCC)   . Generalized abdominal pain   . Upper GI bleed 09/27/2016  . GI bleed 09/27/2016     Past Surgical History:  Procedure Laterality Date  . BACK SURGERY       Prior to Admission medications   Medication Sig Start Date End Date Taking? Authorizing Provider  ibuprofen (ADVIL,MOTRIN) 200 MG tablet Take 200 mg by mouth every 6 (six) hours as needed.   Yes Historical Provider, MD  lisinopril (PRINIVIL,ZESTRIL) 10 MG tablet Take 10 mg by mouth daily.   Yes Historical Provider, MD  omeprazole (PRILOSEC) 20 MG capsule Take 20 mg by mouth  daily.   Yes Historical Provider, MD  ciprofloxacin (CIPRO) 500 MG tablet Take 1 tablet (500 mg total) by mouth daily. 12/05/16   Sharman Cheek, MD  folic acid (FOLVITE) 1 MG tablet Take 1 tablet (1 mg total) by mouth daily. 12/05/16   Sharman Cheek, MD  furosemide (LASIX) 40 MG tablet Take 1 tablet (40 mg total) by mouth 2 (two) times daily. 12/05/16   Sharman Cheek, MD  HYDROmorphone (DILAUDID) 2 MG tablet 1 tab po every four hours for day1; 1 tab po every six hours for day2; 1 tab every eight hours day3; 1 tab every twelve hours day4; one tab daily for daily day5,6 10/03/16   Alford Highland, MD  lactulose (CHRONULAC) 10 GM/15ML solution Take 45 mLs (30 g total) by mouth 2 (two) times daily. 12/05/16   Sharman Cheek, MD  spironolactone (ALDACTONE) 100 MG tablet Take 1 tablet (100 mg total) by mouth daily. 12/05/16   Sharman Cheek, MD  thiamine 100 MG tablet Take 1 tablet (100 mg total) by mouth daily. 12/05/16   Sharman Cheek, MD     Allergies Patient has no known allergies.   Family History  Problem Relation Age of Onset  . Hypertension Mother   . Arthritis-Osteo Mother     Social History Social History  Substance Use Topics  . Smoking status: Former Games developer  . Smokeless tobacco: Never Used  . Alcohol use 2.4 oz/week    4 Glasses of wine per week    Review of Systems  Constitutional:   No fever or chills.  ENT:   No sore throat. No rhinorrhea. Cardiovascular:   No chest pain. Respiratory:   No dyspnea or cough. Gastrointestinal:   Positive generalized abdominal discomfort as above. No vomiting and diarrhea.  Genitourinary:   Negative for dysuria or difficulty urinating. Musculoskeletal:   Negative for focal pain or swelling Neurological:   Negative for headaches 10-point ROS otherwise negative.  ____________________________________________   PHYSICAL EXAM:  VITAL SIGNS: ED Triage Vitals  Enc Vitals Group     BP 12/05/16 0915 (!) 146/90     Pulse Rate  12/05/16 0915 93     Resp 12/05/16 0915 19     Temp 12/05/16 0915 98 F (36.7 C)     Temp Source 12/05/16 0915 Oral     SpO2 12/05/16 0915 98 %     Weight 12/05/16 0915 225 lb (102.1 kg)     Height 12/05/16 0915 6\' 1"  (1.854 m)     Head Circumference --      Peak Flow --      Pain Score 12/05/16 0916 8     Pain Loc --      Pain Edu? --      Excl. in GC? --     Vital signs reviewed, nursing assessments reviewed.   Constitutional:   Alert and oriented. Well appearing and in no distress. Eyes:   No scleral icterus. No conjunctival pallor. PERRL. EOMI.  No nystagmus. ENT   Head:   Normocephalic and atraumatic.   Nose:   No congestion/rhinnorhea. No septal hematoma   Mouth/Throat:   MMM, no pharyngeal erythema. No peritonsillar mass.    Neck:   No stridor. No SubQ emphysema. No meningismus. Hematological/Lymphatic/Immunilogical:   No cervical lymphadenopathy. Cardiovascular:   RRR. Symmetric bilateral radial and DP pulses.  No murmurs.  Respiratory:   Normal respiratory effort without tachypnea nor retractions. Breath sounds are clear and equal bilaterally. No wheezes/rales/rhonchi. Gastrointestinal:   Soft and nontender. Significantly distended with ascites. No tympani. Normoactive bowel sounds.. There is no CVA tenderness.  No rebound, rigidity, or guarding. Genitourinary:   deferred Musculoskeletal:   Nontender with normal range of motion in all extremities. No joint effusions.  No lower extremity tenderness.  No edema. Neurologic:   Normal speech and language.  CN 2-10 normal. Motor grossly intact. No gross focal neurologic deficits are appreciated.  Skin:    Skin is warm, dry and intact. No rash noted.  No petechiae, purpura, or bullae.  ____________________________________________    LABS (pertinent positives/negatives) (all labs ordered are listed, but only abnormal results are displayed) Labs Reviewed  COMPREHENSIVE METABOLIC PANEL - Abnormal; Notable for  the following:       Result Value   Chloride 99 (*)    Glucose, Bld 124 (*)    Calcium 8.2 (*)    Albumin 3.0 (*)    AST 107 (*)    Total Bilirubin 1.6 (*)    All other components within normal limits  CBC - Abnormal; Notable for the following:    RBC 3.93 (*)    MCV 102.1 (*)    MCH 34.9 (*)    All other components within normal limits  URINALYSIS, COMPLETE (UACMP) WITH MICROSCOPIC - Abnormal; Notable for the following:    Color, Urine AMBER (*)    APPearance CLEAR (*)    Squamous Epithelial / LPF 0-5 (*)    All other components within normal limits  LIPASE, BLOOD   ____________________________________________  EKG    ____________________________________________    RADIOLOGY    ____________________________________________   PROCEDURES Procedures  ____________________________________________   INITIAL IMPRESSION / ASSESSMENT AND PLAN / ED COURSE  Pertinent labs & imaging results that were available during my care of the patient were reviewed by me and considered in my medical decision making (see chart for details).  Patient well appearing no acute distress, was symptomatic ascites due to large volume and medication noncompliance and missed follow-up. Vital signs unremarkable, labs unremarkable, exam not consistent with peritonitis. No evidence of obstruction or GI bleed at present time. I discussed his care with Dr. Tobi BastosAnna of GI who will plan to get him into the GI clinic in 5 days. In the meantime, we will ask interventional radiology perform a paracentesis today for symptom relief. Avoid large volume paracentesis due to the need for discharge home and avoiding hemodynamic effects.  I have refilled his medications and ordered his Lasix and Aldactone for today.     Clinical Course as of Dec 09 708  Wed Dec 05, 2016  1253 Patient eloped. He was unwilling to wait until 3:30 when interventional radiology could safely perform his paracentesis. He left without the  prescriptions that I wrote for him for all of his medications. Prior to his departure I did inform him that GI plans to follow up with him on Monday. He does have normal mental status and medical decision-making capacity.  [PS]    Clinical Course User Index [PS] Sharman CheekPhillip Joni Norrod, MD   ____________________________________________   FINAL CLINICAL IMPRESSION(S) / ED DIAGNOSES  Final diagnoses:  Abdominal pain  Alcoholic cirrhosis of liver with ascites (HCC)  Encounter for medication refill      Current Discharge Medication List       Portions of this note were generated with dragon dictation software. Dictation errors may occur despite best attempts at proofreading.    Sharman CheekPhillip Rein Popov, MD 12/09/16 [phone]

## 2016-12-05 NOTE — ED Notes (Signed)
Pt chose to leave prior to procedure and discharge instruction review. Pt NAD prior to leaving.

## 2016-12-10 ENCOUNTER — Inpatient Hospital Stay: Payer: Self-pay | Admitting: Family Medicine

## 2016-12-14 ENCOUNTER — Inpatient Hospital Stay
Admission: EM | Admit: 2016-12-14 | Discharge: 2016-12-18 | DRG: 433 | Disposition: A | Discharge status: Home / Self Care | Payer: Medicaid Other | Attending: Specialist | Admitting: Specialist

## 2016-12-14 ENCOUNTER — Inpatient Hospital Stay: Payer: Medicaid Other

## 2016-12-14 ENCOUNTER — Encounter: Payer: Self-pay | Admitting: Emergency Medicine

## 2016-12-14 DIAGNOSIS — Z79899 Other long term (current) drug therapy: Secondary | ICD-10-CM | POA: Diagnosis not present

## 2016-12-14 DIAGNOSIS — I8510 Secondary esophageal varices without bleeding: Secondary | ICD-10-CM | POA: Diagnosis present

## 2016-12-14 DIAGNOSIS — Z9114 Patient's other noncompliance with medication regimen: Secondary | ICD-10-CM | POA: Diagnosis not present

## 2016-12-14 DIAGNOSIS — K3189 Other diseases of stomach and duodenum: Secondary | ICD-10-CM | POA: Diagnosis present

## 2016-12-14 DIAGNOSIS — K766 Portal hypertension: Secondary | ICD-10-CM | POA: Diagnosis present

## 2016-12-14 DIAGNOSIS — K7031 Alcoholic cirrhosis of liver with ascites: Principal | ICD-10-CM | POA: Diagnosis present

## 2016-12-14 DIAGNOSIS — K7290 Hepatic failure, unspecified without coma: Secondary | ICD-10-CM | POA: Diagnosis present

## 2016-12-14 DIAGNOSIS — K219 Gastro-esophageal reflux disease without esophagitis: Secondary | ICD-10-CM | POA: Diagnosis present

## 2016-12-14 DIAGNOSIS — F1020 Alcohol dependence, uncomplicated: Secondary | ICD-10-CM | POA: Diagnosis present

## 2016-12-14 DIAGNOSIS — R1084 Generalized abdominal pain: Secondary | ICD-10-CM

## 2016-12-14 DIAGNOSIS — I8500 Esophageal varices without bleeding: Secondary | ICD-10-CM

## 2016-12-14 DIAGNOSIS — B1920 Unspecified viral hepatitis C without hepatic coma: Secondary | ICD-10-CM | POA: Diagnosis present

## 2016-12-14 DIAGNOSIS — K920 Hematemesis: Secondary | ICD-10-CM

## 2016-12-14 DIAGNOSIS — D6959 Other secondary thrombocytopenia: Secondary | ICD-10-CM | POA: Diagnosis present

## 2016-12-14 DIAGNOSIS — I10 Essential (primary) hypertension: Secondary | ICD-10-CM | POA: Diagnosis present

## 2016-12-14 DIAGNOSIS — R188 Other ascites: Secondary | ICD-10-CM | POA: Diagnosis present

## 2016-12-14 DIAGNOSIS — Z87891 Personal history of nicotine dependence: Secondary | ICD-10-CM | POA: Diagnosis not present

## 2016-12-14 DIAGNOSIS — D649 Anemia, unspecified: Secondary | ICD-10-CM | POA: Diagnosis not present

## 2016-12-14 DIAGNOSIS — E872 Acidosis: Secondary | ICD-10-CM | POA: Diagnosis present

## 2016-12-14 LAB — APTT: aPTT: 35 seconds (ref 24–36)

## 2016-12-14 LAB — CBC
HCT: 39.7 % — ABNORMAL LOW (ref 40.0–52.0)
Hemoglobin: 13.8 g/dL (ref 13.0–18.0)
MCH: 35.3 pg — ABNORMAL HIGH (ref 26.0–34.0)
MCHC: 34.8 g/dL (ref 32.0–36.0)
MCV: 101.4 fL — AB (ref 80.0–100.0)
PLATELETS: 119 10*3/uL — AB (ref 150–440)
RBC: 3.91 MIL/uL — AB (ref 4.40–5.90)
RDW: 14.1 % (ref 11.5–14.5)
WBC: 2.7 10*3/uL — AB (ref 3.8–10.6)

## 2016-12-14 LAB — LIPASE, BLOOD: Lipase: 48 U/L (ref 11–51)

## 2016-12-14 LAB — PROTIME-INR
INR: 1.3
PROTHROMBIN TIME: 16.3 s — AB (ref 11.4–15.2)

## 2016-12-14 LAB — COMPREHENSIVE METABOLIC PANEL WITH GFR
ALT: 43 U/L (ref 17–63)
AST: 128 U/L — ABNORMAL HIGH (ref 15–41)
Albumin: 3.2 g/dL — ABNORMAL LOW (ref 3.5–5.0)
Alkaline Phosphatase: 66 U/L (ref 38–126)
Anion gap: 9 (ref 5–15)
BUN: 7 mg/dL (ref 6–20)
CO2: 28 mmol/L (ref 22–32)
Calcium: 8.2 mg/dL — ABNORMAL LOW (ref 8.9–10.3)
Chloride: 101 mmol/L (ref 101–111)
Creatinine, Ser: 0.63 mg/dL (ref 0.61–1.24)
GFR calc Af Amer: >60 mL/min
GFR calc non Af Amer: >60 mL/min
Glucose, Bld: 116 mg/dL — ABNORMAL HIGH (ref 65–99)
Potassium: 3.5 mmol/L (ref 3.5–5.1)
Sodium: 138 mmol/L (ref 135–145)
Total Bilirubin: 2.2 mg/dL — ABNORMAL HIGH (ref 0.3–1.2)
Total Protein: 7.8 g/dL (ref 6.5–8.1)

## 2016-12-14 LAB — TYPE AND SCREEN
ABO/RH(D): O POS
ANTIBODY SCREEN: NEGATIVE

## 2016-12-14 LAB — BODY FLUID CELL COUNT WITH DIFFERENTIAL
Eos, Fluid: 0 %
Lymphs, Fluid: 52 %
Monocyte-Macrophage-Serous Fluid: 46 %
Neutrophil Count, Fluid: 2 %
Total Nucleated Cell Count, Fluid: 77 cu mm

## 2016-12-14 LAB — LACTIC ACID, PLASMA: Lactic Acid, Venous: 2.5 mmol/L (ref 0.5–1.9)

## 2016-12-14 MED ORDER — OCTREOTIDE LOAD VIA INFUSION
50.0000 ug | Freq: Once | INTRAVENOUS | Status: AC
  Administered 2016-12-14: 50 ug via INTRAVENOUS
  Filled 2016-12-14: qty 25

## 2016-12-14 MED ORDER — LORAZEPAM 2 MG/ML IJ SOLN
1.0000 mg | Freq: Four times a day (QID) | INTRAMUSCULAR | Status: AC | PRN
  Administered 2016-12-14 – 2016-12-16 (×3): 1 mg via INTRAVENOUS
  Filled 2016-12-14 (×3): qty 1

## 2016-12-14 MED ORDER — ACETAMINOPHEN 650 MG RE SUPP: 650.0000 mg | Freq: Four times a day (QID) | RECTAL | Status: DC | PRN

## 2016-12-14 MED ORDER — DEXTROSE 5 % IV SOLN: 1.0000 g | Freq: Once | INTRAVENOUS | Status: DC

## 2016-12-14 MED ORDER — CEFTRIAXONE SODIUM-DEXTROSE 1-3.74 GM-% IV SOLR
1.0000 g | Freq: Once | INTRAVENOUS | Status: AC
  Administered 2016-12-14: 1 g via INTRAVENOUS
  Filled 2016-12-14: qty 50

## 2016-12-14 MED ORDER — TRAMADOL HCL 50 MG PO TABS
50.0000 mg | ORAL_TABLET | Freq: Four times a day (QID) | ORAL | Status: DC | PRN
  Administered 2016-12-14 – 2016-12-17 (×7): 50 mg via ORAL
  Filled 2016-12-14 (×7): qty 1

## 2016-12-14 MED ORDER — ONDANSETRON HCL 4 MG/2ML IJ SOLN
4.0000 mg | Freq: Four times a day (QID) | INTRAMUSCULAR | Status: DC | PRN
  Administered 2016-12-14 – 2016-12-16 (×5): 4 mg via INTRAVENOUS
  Filled 2016-12-14 (×5): qty 2

## 2016-12-14 MED ORDER — THIAMINE HCL 100 MG PO TABS
100.0000 mg | ORAL_TABLET | Freq: Every day | ORAL | Status: DC
  Administered 2016-12-14: 21:00:00 100 mg via ORAL
  Filled 2016-12-14: qty 1

## 2016-12-14 MED ORDER — FUROSEMIDE 10 MG/ML IJ SOLN
40.0000 mg | Freq: Two times a day (BID) | INTRAMUSCULAR | Status: DC
  Administered 2016-12-14 – 2016-12-18 (×8): 40 mg via INTRAVENOUS
  Filled 2016-12-14 (×8): qty 4

## 2016-12-14 MED ORDER — SENNOSIDES-DOCUSATE SODIUM 8.6-50 MG PO TABS: 1.0000 | ORAL_TABLET | Freq: Every evening | ORAL | Status: DC | PRN

## 2016-12-14 MED ORDER — LORAZEPAM 2 MG PO TABS: 0.0000 mg | ORAL_TABLET | Freq: Two times a day (BID) | ORAL | Status: DC

## 2016-12-14 MED ORDER — ONDANSETRON HCL 4 MG PO TABS: 4.0000 mg | ORAL_TABLET | Freq: Four times a day (QID) | ORAL | Status: DC | PRN

## 2016-12-14 MED ORDER — SPIRONOLACTONE 25 MG PO TABS
100.0000 mg | ORAL_TABLET | Freq: Every day | ORAL | Status: DC
  Administered 2016-12-14 – 2016-12-18 (×5): 100 mg via ORAL
  Filled 2016-12-14 (×5): qty 4

## 2016-12-14 MED ORDER — VITAMIN B-1 100 MG PO TABS
100.0000 mg | ORAL_TABLET | Freq: Every day | ORAL | Status: DC
  Administered 2016-12-15 – 2016-12-18 (×4): 100 mg via ORAL
  Filled 2016-12-14 (×6): qty 1

## 2016-12-14 MED ORDER — THIAMINE HCL 100 MG/ML IJ SOLN: 100.0000 mg | Freq: Every day | INTRAMUSCULAR | Status: DC

## 2016-12-14 MED ORDER — MORPHINE SULFATE (PF) 4 MG/ML IV SOLN
4.0000 mg | Freq: Once | INTRAVENOUS | Status: AC
  Administered 2016-12-14: 4 mg via INTRAVENOUS
  Filled 2016-12-14: qty 1

## 2016-12-14 MED ORDER — SODIUM CHLORIDE 0.9 % IV SOLN
8.0000 mg/h | INTRAVENOUS | Status: DC
  Administered 2016-12-14 – 2016-12-15 (×3): 8 mg/h via INTRAVENOUS
  Filled 2016-12-14 (×5): qty 80

## 2016-12-14 MED ORDER — LIDOCAINE HCL (PF) 1 % IJ SOLN
5.0000 mL | Freq: Once | INTRAMUSCULAR | Status: AC
  Administered 2016-12-14: 5 mL via INTRADERMAL
  Filled 2016-12-14: qty 5

## 2016-12-14 MED ORDER — PANTOPRAZOLE SODIUM 40 MG IV SOLR: 40.0000 mg | Freq: Two times a day (BID) | INTRAVENOUS | Status: DC

## 2016-12-14 MED ORDER — LACTULOSE 10 GM/15ML PO SOLN
30.0000 g | Freq: Two times a day (BID) | ORAL | Status: DC
  Administered 2016-12-14 – 2016-12-18 (×8): 30 g via ORAL
  Filled 2016-12-14 (×8): qty 60

## 2016-12-14 MED ORDER — ADULT MULTIVITAMIN W/MINERALS CH
1.0000 | ORAL_TABLET | Freq: Every day | ORAL | Status: DC
  Administered 2016-12-14 – 2016-12-18 (×5): 1 via ORAL
  Filled 2016-12-14 (×5): qty 1

## 2016-12-14 MED ORDER — LORAZEPAM 2 MG PO TABS
0.0000 mg | ORAL_TABLET | Freq: Four times a day (QID) | ORAL | Status: AC
  Administered 2016-12-15: 01:00:00 1 mg via ORAL

## 2016-12-14 MED ORDER — ACETAMINOPHEN 325 MG PO TABS: 650.0000 mg | ORAL_TABLET | Freq: Four times a day (QID) | ORAL | Status: DC | PRN

## 2016-12-14 MED ORDER — SODIUM CHLORIDE 0.9 % IV BOLUS (SEPSIS)
1000.0000 mL | Freq: Once | INTRAVENOUS | Status: AC
  Administered 2016-12-14: 1000 mL via INTRAVENOUS

## 2016-12-14 MED ORDER — FOLIC ACID 1 MG PO TABS
1.0000 mg | ORAL_TABLET | Freq: Every day | ORAL | Status: DC
  Administered 2016-12-14 – 2016-12-18 (×4): 1 mg via ORAL
  Filled 2016-12-14 (×5): qty 1

## 2016-12-14 MED ORDER — FOLIC ACID 1 MG PO TABS
1.0000 mg | ORAL_TABLET | Freq: Every day | ORAL | Status: DC
  Administered 2016-12-16: 1 mg via ORAL

## 2016-12-14 MED ORDER — SODIUM CHLORIDE 0.9 % IV SOLN
50.0000 ug/h | INTRAVENOUS | Status: DC
  Administered 2016-12-14 – 2016-12-15 (×3): 50 ug/h via INTRAVENOUS
  Filled 2016-12-14 (×5): qty 1

## 2016-12-14 MED ORDER — LISINOPRIL 20 MG PO TABS
20.0000 mg | ORAL_TABLET | Freq: Every day | ORAL | Status: DC
  Administered 2016-12-14 – 2016-12-18 (×5): 20 mg via ORAL
  Filled 2016-12-14 (×5): qty 1

## 2016-12-14 MED ORDER — PANTOPRAZOLE SODIUM 40 MG IV SOLR
80.0000 mg | Freq: Once | INTRAVENOUS | Status: AC
  Administered 2016-12-14: 80 mg via INTRAVENOUS
  Filled 2016-12-14 (×2): qty 80

## 2016-12-14 MED ORDER — LORAZEPAM 2 MG PO TABS: 0.0000 mg | ORAL_TABLET | Freq: Four times a day (QID) | ORAL | Status: DC

## 2016-12-14 MED ORDER — LORAZEPAM 1 MG PO TABS
1.0000 mg | ORAL_TABLET | Freq: Four times a day (QID) | ORAL | Status: AC | PRN
  Administered 2016-12-15 – 2016-12-16 (×2): 1 mg via ORAL
  Filled 2016-12-14 (×3): qty 1

## 2016-12-14 MED ORDER — LORAZEPAM 2 MG PO TABS
0.0000 mg | ORAL_TABLET | Freq: Two times a day (BID) | ORAL | Status: AC
  Administered 2016-12-16: 18:00:00 2 mg via ORAL
  Filled 2016-12-14: qty 1

## 2016-12-14 NOTE — H&P (Addendum)
[facility] [place] [facility]   PATIENT NAME: Antonio Ryan    MR#:  [id number]  DATE OF BIRTH:  02-Jul-1956  DATE OF ADMISSION:  12/14/2016  PRIMARY CARE PHYSICIAN: none REQUESTING/REFERRING PHYSICIAN: dr Don Perking  CHIEF COMPLAINT:   Increasing fluid in abdomen HISTORY OF PRESENT ILLNESS:  English Antonio Ryan  is a 61 y.o. male with a known history of Hepatitis C and EtOH related liver cirrhosis, SPEP and essential hypertension who presents with above complaint. Patient reports over the past several weeks he has had increasing abdominal girth which results in abdominal pain. He ran out of medications a few weeks ago. He denies fever but has had some chills and nausea. He denies coffee-ground emesis or melena. He does report that yesterday he had episode of vomiting food with pink tinged blood. He continues to drink bottles of wine almost on a daily basis. He has not undergone EGD or colonoscopy. In the emergency room due to concern of SBP he had paracentesis and was started on ceftriaxone. He was also started octreotide and Protonix drip due to possible upper GI bleed. Again patient reports some pink tinge vomit yesterday and this morning, however denies bright red blood per rectum, melena or frank hematemesis.  PAST MEDICAL HISTORY:   Past Medical History:  Diagnosis Date  . Acid reflux   . Cirrhosis (HCC)   . Hepatitis C   . Hypertension   . Neuropathy (HCC)   . Spinal stenosis     PAST SURGICAL HISTORY:   Past Surgical History:  Procedure Laterality Date  . BACK SURGERY      SOCIAL HISTORY:   Social History  Substance Use Topics  . Smoking status: Former Games developer  . Smokeless tobacco: Never Used  . Alcohol use 2.4 oz/week    3-4 bottles almost daily     FAMILY HISTORY:   Family History  Problem Relation Age of Onset  . Hypertension Mother   . Arthritis-Osteo Mother     DRUG ALLERGIES:  No Known Allergies  REVIEW OF SYSTEMS:   Review of  Systems  Constitutional: Positive for chills. Negative for fever and malaise/fatigue.  HENT: Negative.  Negative for ear discharge, ear pain, hearing loss, nosebleeds and sore throat.   Eyes: Negative.  Negative for blurred vision and pain.  Respiratory: Negative.  Negative for cough, hemoptysis, shortness of breath and wheezing.   Cardiovascular: Negative.  Negative for chest pain, palpitations and leg swelling.  Gastrointestinal: Positive for abdominal pain, nausea and vomiting. Negative for blood in stool and diarrhea.  Genitourinary: Negative.  Negative for dysuria.  Musculoskeletal: Negative.  Negative for back pain.  Skin: Negative.   Neurological: Negative for dizziness, tremors, speech change, focal weakness, seizures and headaches.  Endo/Heme/Allergies: Negative.  Does not bruise/bleed easily.  Psychiatric/Behavioral: Negative.  Negative for depression, hallucinations and suicidal ideas.    MEDICATIONS AT HOME:   Prior to Admission medications   Medication Sig Start Date End Date Taking? Authorizing Provider  folic acid (FOLVITE) 1 MG tablet Take 1 tablet (1 mg total) by mouth daily. 12/05/16  Yes Sharman Cheek, MD  furosemide (LASIX) 40 MG tablet Take 1 tablet (40 mg total) by mouth 2 (two) times daily. 12/05/16  Yes Sharman Cheek, MD             Yes Historical Provider, MD  omeprazole (PRILOSEC) 20 MG capsule Take 20 mg by mouth daily.   Yes Historical Provider, MD  spironolactone (ALDACTONE) 100 MG tablet Take 1  tablet (100 mg total) by mouth daily. 12/05/16  Yes Sharman CheekPhillip Stafford, MD  ciprofloxacin (CIPRO) 500 MG tablet Take 1 tablet (500 mg total) by mouth daily. Patient not taking: Reported on 12/14/2016 12/05/16   Sharman CheekPhillip Stafford, MD  HYDROmorphone (DILAUDID) 2 MG tablet 1 tab po every four hours for day1; 1 tab po every six hours for day2; 1 tab every eight hours day3; 1 tab every twelve hours day4; one tab daily for daily day5,6 Patient not taking: Reported on 12/14/2016  10/03/16   Alford Highlandichard Wieting, MD  lactulose (CHRONULAC) 10 GM/15ML solution Take 45 mLs (30 g total) by mouth 2 (two) times daily. Patient not taking: Reported on 12/14/2016 12/05/16   Sharman CheekPhillip Stafford, MD  thiamine 100 MG tablet Take 1 tablet (100 mg total) by mouth daily. Patient not taking: Reported on 12/14/2016 12/05/16   Sharman CheekPhillip Stafford, MD      VITAL SIGNS:  Blood pressure 138/77, pulse 82, temperature 98.2 F (36.8 C), temperature source Oral, resp. rate 18, weight 102.1 kg (225 lb), SpO2 98 %.  PHYSICAL EXAMINATION:   Physical Exam  Constitutional: He is oriented to person, place, and time and well-developed, well-nourished, and in no distress. No distress.  HENT:  Head: Normocephalic.  Eyes: No scleral icterus.  Neck: Normal range of motion. Neck supple. No JVD present. No tracheal deviation present.  Cardiovascular: Normal rate, regular rhythm and normal heart sounds.  Exam reveals no gallop and no friction rub.   No murmur heard. Pulmonary/Chest: Effort normal and breath sounds normal. No respiratory distress. He has no wheezes. He has no rales. He exhibits no tenderness.  Abdominal: He exhibits distension. He exhibits no mass. There is no tenderness. There is no rebound and no guarding.  Musculoskeletal: Normal range of motion. He exhibits edema.  Neurological: He is alert and oriented to person, place, and time.  Skin: Skin is warm. No rash noted. No erythema.  Psychiatric: Affect and judgment normal.      LABORATORY PANEL:   CBC  Recent Labs Lab 12/14/16 1054  WBC 2.7*  HGB 13.8  HCT 39.7*  PLT 119*   ------------------------------------------------------------------------------------------------------------------  Chemistries   Recent Labs Lab 12/14/16 1145  NA 138  K 3.5  CL 101  CO2 28  GLUCOSE 116*  BUN 7  CREATININE 0.63  CALCIUM 8.2*  AST 128*  ALT 43  ALKPHOS 66  BILITOT 2.2*    ------------------------------------------------------------------------------------------------------------------  Cardiac Enzymes No results for input(s): TROPONINI in the last 168 hours. ------------------------------------------------------------------------------------------------------------------  RADIOLOGY:  No results found.  EKG:  No orders found for this or any previous visit.  IMPRESSION AND PLAN:   61 year old male with recent diagnosis of liver cirrhosis from hepatitis C and EtOH and SBP off of medications for several weeks who presents with increasing abdominal girth/ascites  1. Ascites: Patient underwent paracentesis in the emergency room to evaluate for SBP. He will continue on Rocephin. Of note patient was discharged with ciprofloxacin back in December however he is not taking these medications. GI consultation. We have GI coverage starting this evening and patient is hemodynamically stable Ultrasound-guided paracentesis for a.m. in the past he had 13 L taken off IV Lasix and continue Aldactone  2. Nausea and vomiting with some blood but patient denies frank hematemesis: GI consultation Patient has not had endoscopy in the past Continue IV octreotide and PPI  3. Liver cirrhosis due to hepatitis C and EtOH: GI consultation Continue Lasix and Aldactone Patient will need ciprofloxacin at discharge with history  of SBP Continue lactulose   4. Essential hypertension: Continue Aldactone  5. 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  6. EtOH abuse: Start CIWA protocol All the records are reviewed and case discussed with ED provider with plan as outlined above. Management plans discussed with the patient and he is in agreement  CODE STATUS:  full  TOTAL TIME TAKING CARE OF THIS PATIENT: 45 minutes.    Posey Jasmin M.D on 12/14/2016 at 2:01 PM  Between 7am to 6pm - Pager -  [phone]  After 6pm go to [email or web address] - password [email or web address]  [facility]  Office  [phone]  CC: Primary care physician; Pcp Not In System

## 2016-12-14 NOTE — ED Provider Notes (Signed)
[facility] Emergency Department Provider Note  ____________________________________________  Time seen: Approximately 11:55 AM  I have reviewed the triage vital signs and the nursing notes.   HISTORY  Chief Complaint Abdominal Pain and Emesis   HPI Antonio Ryan is a 61 y.o. male with a historyof recently diagnosed cirrhosis from alcohol and hepatitis C complicated by SBP, GI bleed, and ascites who presents for evaluation of abdominal pain and hematemesis. Patient reports that he ran out of all of his medications a couple weeks ago and since then has had progressively worsening abdominal distention. Over the course of the last few days started having severe sharp diffuse constant and nonradiating abdominal pain associated with chills and nausea. No fever. Yesterday he had one episode of vomiting and noticed red blood in it. Had another episode today. No coffee-ground emesis, no melena, no diarrhea or constipation. No chest pain. Patient reports mild shortness of breath since his abdomen has gotten so distended. He continues to drink alcohol. Patient has never undergone an endoscopy  Past Medical History:  Diagnosis Date  . Acid reflux   . Cirrhosis (HCC)   . Hepatitis C   . Hypertension   . Neuropathy (HCC)   . Spinal stenosis     Patient Active Problem List   Diagnosis Date Noted  . Esophageal varices without bleeding (HCC)   . Ascites 12/14/2016  . SBP (spontaneous bacterial peritonitis) (HCC)   . Ascites due to alcoholic cirrhosis (HCC)   . Generalized abdominal pain   . Hematemesis 09/27/2016  . GI bleed 09/27/2016    Past Surgical History:  Procedure Laterality Date  . BACK SURGERY      Prior to Admission medications   Medication Sig Start Date End Date Taking? Authorizing Provider  folic acid (FOLVITE) 1 MG tablet Take 1 tablet (1 mg total) by mouth daily. 12/05/16  Yes Sharman Cheek, MD  furosemide (LASIX) 40 MG tablet Take 1  tablet (40 mg total) by mouth 2 (two) times daily. 12/05/16  Yes Sharman Cheek, MD  ibuprofen (ADVIL,MOTRIN) 200 MG tablet Take 200 mg by mouth every 6 (six) hours as needed.   Yes Historical Provider, MD  lisinopril (PRINIVIL,ZESTRIL) 20 MG tablet Take 20 mg by mouth daily.    Yes Historical Provider, MD  omeprazole (PRILOSEC) 20 MG capsule Take 20 mg by mouth daily.   Yes Historical Provider, MD  spironolactone (ALDACTONE) 100 MG tablet Take 1 tablet (100 mg total) by mouth daily. 12/05/16  Yes Sharman Cheek, MD  ciprofloxacin (CIPRO) 500 MG tablet Take 1 tablet (500 mg total) by mouth daily. Patient not taking: Reported on 12/14/2016 12/05/16   Sharman Cheek, MD  HYDROmorphone (DILAUDID) 2 MG tablet 1 tab po every four hours for day1; 1 tab po every six hours for day2; 1 tab every eight hours day3; 1 tab every twelve hours day4; one tab daily for daily day5,6 Patient not taking: Reported on 12/14/2016 10/03/16   Alford Highland, MD  lactulose (CHRONULAC) 10 GM/15ML solution Take 45 mLs (30 g total) by mouth 2 (two) times daily. Patient not taking: Reported on 12/14/2016 12/05/16   Sharman Cheek, MD  thiamine 100 MG tablet Take 1 tablet (100 mg total) by mouth daily. Patient not taking: Reported on 12/14/2016 12/05/16   Sharman Cheek, MD    Allergies Patient has no known allergies.  Family History  Problem Relation Age of Onset  . Hypertension Mother   . Arthritis-Osteo Mother     Social History  Social History  Substance Use Topics  . Smoking status: Former Games developer  . Smokeless tobacco: Never Used  . Alcohol use 2.4 oz/week    4 Glasses of wine per week    Review of Systems  Constitutional: Negative for fever. + chills Eyes: Negative for visual changes. ENT: Negative for sore throat. Neck: No neck pain  Cardiovascular: Negative for chest pain. Respiratory: + shortness of breath. Gastrointestinal: + diffuse abdominal pain, hematemesis. No diarrhea. Genitourinary:  Negative for dysuria. Musculoskeletal: Negative for back pain. Skin: Negative for rash. Neurological: Negative for headaches, weakness or numbness. Psych: No SI or HI  ____________________________________________   PHYSICAL EXAM:  VITAL SIGNS: ED Triage Vitals  Enc Vitals Group     BP 12/14/16 1058 138/77     Pulse Rate 12/14/16 1058 82     Resp 12/14/16 1058 18     Temp 12/14/16 1058 98.2 F (36.8 C)     Temp Source 12/14/16 1058 Oral     SpO2 12/14/16 1058 98 %     Weight 12/14/16 1033 225 lb (102.1 kg)     Height --      Head Circumference --      Peak Flow --      Pain Score 12/14/16 1033 10     Pain Loc --      Pain Edu? --      Excl. in GC? --     Constitutional: Alert and oriented. Well appearing and in no apparent distress. HEENT:      Head: Normocephalic and atraumatic.         Eyes: Conjunctivae are normal. Sclera is non-icteric. EOMI. PERRL      Mouth/Throat: Mucous membranes are moist.       Neck: Supple with no signs of meningismus. Cardiovascular: Regular rate and rhythm. No murmurs, gallops, or rubs. 2+ symmetrical distal pulses are present in all extremities. No JVD. Respiratory: Normal respiratory effort. Lungs are clear to auscultation bilaterally. No wheezes, crackles, or rhonchi.  Gastrointestinal: Slightly distended abdomen with diffuse tenderness throughout, no rebound or guarding  Genitourinary: No CVA tenderness. Musculoskeletal: No pitting edema Neurologic: No asterixis. Normal speech and language. Face is symmetric. Moving all extremities. No gross focal neurologic deficits are appreciated. Skin: Skin is warm, dry and intact. No rash noted. Psychiatric: Mood and affect are normal. Speech and behavior are normal.  ____________________________________________   LABS (all labs ordered are listed, but only abnormal results are displayed)  Labs Reviewed  CBC - Abnormal; Notable for the following:       Result Value   WBC 2.7 (*)    RBC 3.91  (*)    HCT 39.7 (*)    MCV 101.4 (*)    MCH 35.3 (*)    Platelets 119 (*)    All other components within normal limits  PROTIME-INR - Abnormal; Notable for the following:    Prothrombin Time 16.3 (*)    All other components within normal limits  LACTIC ACID, PLASMA - Abnormal; Notable for the following:    Lactic Acid, Venous 2.5 (*)    All other components within normal limits  BODY FLUID CELL COUNT WITH DIFFERENTIAL - Abnormal; Notable for the following:    Color, Fluid YELLOW (*)    Appearance, Fluid HAZY (*)    All other components within normal limits  COMPREHENSIVE METABOLIC PANEL - Abnormal; Notable for the following:    Glucose, Bld 116 (*)    Calcium 8.2 (*)    Albumin  3.2 (*)    AST 128 (*)    Total Bilirubin 2.2 (*)    All other components within normal limits  BASIC METABOLIC PANEL - Abnormal; Notable for the following:    Sodium 133 (*)    Glucose, Bld 158 (*)    Creatinine, Ser 0.55 (*)    Calcium 7.5 (*)    All other components within normal limits  CBC - Abnormal; Notable for the following:    WBC 3.1 (*)    RBC 3.38 (*)    Hemoglobin 11.9 (*)    HCT 34.5 (*)    MCV 101.9 (*)    MCH 35.0 (*)    Platelets 57 (*)    All other components within normal limits  HEPATITIS C VRS RNA DETECT BY PCR-QUAL - Abnormal; Notable for the following:    Hepatitis C Vrs RNA by PCR-Qual Positive (*)    All other components within normal limits  CBC - Abnormal; Notable for the following:    RBC 4.06 (*)    MCV 101.1 (*)    MCH 35.3 (*)    Platelets 69 (*)    All other components within normal limits  BODY FLUID CULTURE  CULTURE, BLOOD (ROUTINE X 2)  CULTURE, BLOOD (ROUTINE X 2)  APTT  LIPASE, BLOOD  HIV ANTIBODY (ROUTINE TESTING)  URINALYSIS, COMPLETE (UACMP) WITH MICROSCOPIC  HEPATITIS C GENOTYPE  TYPE AND SCREEN   ____________________________________________  EKG  none  ____________________________________________  RADIOLOGY  none    ____________________________________________   PROCEDURES  Procedure(s) performed:yes .Paracentesis Date/Time: 12/14/2016 12:45 PM Performed by: Nita SickleVERONESE, [place] Authorized by: Nita SickleVERONESE, [place]   Consent:    Consent obtained:  Written   Consent given by:  Patient   Risks discussed:  Bleeding, bowel perforation, infection and pain Pre-procedure details:    Procedure purpose:  Diagnostic   Preparation: Patient was prepped and draped in usual sterile fashion   Anesthesia (see MAR for exact dosages):    Anesthesia method:  Local infiltration   Local anesthetic:  Lidocaine 1% w/o epi Procedure details:    Needle gauge:  22   Ultrasound guidance: yes     Puncture site:  R lower quadrant   Fluid removed amount:  30   Fluid appearance:  Yellow   Dressing:  Adhesive bandage Post-procedure details:    Patient tolerance of procedure:  Tolerated well, no immediate complications   Critical Care performed:  None ____________________________________________   INITIAL IMPRESSION / ASSESSMENT AND PLAN / ED COURSE   61 y.o. male with a historyof recently diagnosed cirrhosis from alcohol and hepatitis C complicated by SBP, GI bleed, and ascites who presents for evaluation of abdominal distention associated with diffuse sharp pain, nausea, and two episodes of hematemesis. Patient is in no distress with normal vital signs, he has severely distended abdomen with diffuse tenderness throughout concerning for SBP. We'll do a diagnostic paracentesis. Patient has never undergone an endoscopy therefore it is unknown if he has very sees. Since he is having hematemesis I will start patient on octreotide, protonix, ceftriaxone, and IV fluids. We'll get blood cultures and blood work. Unfortunately we do not have GI coverage today so [facility].  Clinical Course as of Dec 17 1005  Fri Dec 14, 2016  1337 Hemoglobin and vitals are stable. I discussed  with Dr. Tildon HuskyModi hospitalist who will admit patient since we will have GI coverage starting tonight at 7 PM and patient is not  actively vomiting and has no known history of varices. Ascites fluid does not seem to meet criteria for SBP. Patient is getting fluids for a lactic acidosis.  [CV]    Clinical Course User Index [CV] Nita Sickle, MD    Pertinent labs & imaging results that were available during my care of the patient were reviewed by me and considered in my medical decision making (see chart for details).    ____________________________________________   FINAL CLINICAL IMPRESSION(S) / ED DIAGNOSES  Final diagnoses:  Ascites due to alcoholic cirrhosis (HCC)  Generalized abdominal pain  Hematemesis, presence of nausea not specified      NEW MEDICATIONS STARTED DURING THIS VISIT:  Current Discharge Medication List       Note:  This document was prepared using Dragon voice recognition software and may include unintentional dictation errors.    Nita Sickle, MD 12/17/16 1007

## 2016-12-14 NOTE — ED Triage Notes (Signed)
Pt to stat desk via ems with abd pain and vomiting blood for two weeks. Pt recently seen here for parentesis. Ems reports 138/84, 96 ra, p80.

## 2016-12-15 LAB — CBC
HCT: 34.5 % — ABNORMAL LOW (ref 40.0–52.0)
Hemoglobin: 11.9 g/dL — ABNORMAL LOW (ref 13.0–18.0)
MCH: 35 pg — AB (ref 26.0–34.0)
MCHC: 34.4 g/dL (ref 32.0–36.0)
MCV: 101.9 fL — ABNORMAL HIGH (ref 80.0–100.0)
Platelets: 57 10*3/uL — ABNORMAL LOW (ref 150–440)
RBC: 3.38 MIL/uL — AB (ref 4.40–5.90)
RDW: 14 % (ref 11.5–14.5)
WBC: 3.1 10*3/uL — ABNORMAL LOW (ref 3.8–10.6)

## 2016-12-15 LAB — BASIC METABOLIC PANEL
Anion gap: 6 (ref 5–15)
BUN: 7 mg/dL (ref 6–20)
CALCIUM: 7.5 mg/dL — AB (ref 8.9–10.3)
CO2: 25 mmol/L (ref 22–32)
CREATININE: 0.55 mg/dL — AB (ref 0.61–1.24)
Chloride: 102 mmol/L (ref 101–111)
GFR calc non Af Amer: >60 mL/min (ref >60)
Glucose, Bld: 158 mg/dL — ABNORMAL HIGH (ref 65–99)
Potassium: 4 mmol/L (ref 3.5–5.1)
Sodium: 133 mmol/L — ABNORMAL LOW (ref 135–145)

## 2016-12-15 LAB — HIV ANTIBODY (ROUTINE TESTING W REFLEX): HIV Screen 4th Generation wRfx: NONREACTIVE

## 2016-12-15 MED ORDER — STERILE WATER FOR INJECTION IJ SOLN
INTRAMUSCULAR | Status: AC
  Administered 2016-12-15: 23:00:00 10 mL
  Filled 2016-12-15: qty 10

## 2016-12-15 MED ORDER — ALUM & MAG HYDROXIDE-SIMETH 200-200-20 MG/5ML PO SUSP
30.0000 mL | ORAL | Status: DC | PRN
  Administered 2016-12-15 – 2016-12-16 (×4): 30 mL via ORAL
  Filled 2016-12-15 (×4): qty 30

## 2016-12-15 MED ORDER — PANTOPRAZOLE SODIUM 40 MG IV SOLR
40.0000 mg | Freq: Two times a day (BID) | INTRAVENOUS | Status: DC
  Administered 2016-12-15 – 2016-12-17 (×5): 40 mg via INTRAVENOUS
  Filled 2016-12-15 (×5): qty 40

## 2016-12-15 NOTE — Clinical Social Work Note (Signed)
Via chart review, the CSW is aware of SNF recommendation for patient from Pt. CSW will assess when able to determine eligibility. Due to lack of insurance and barrier of seemingly current substance use, placement may be difficult. CSW is following.   Argentina PonderKaren Martha Mayukha Symmonds, MSW, Theresia MajorsLCSWA [phone]

## 2016-12-15 NOTE — Plan of Care (Signed)
Problem: Education: Goal: Knowledge of [facility] Education information/materials will improve Outcome: Progressing VSS, free of falls during shift.  Reported chronic back pain 8/10, received PRN Tramadol 50mg .  CIWA 3-4, received PRN Ativan 1mg  x1 for agitation/nausea. Nausea/emesis x2 during shift.  No other complaints overnight.  Call bell within reach, WCTM.

## 2016-12-15 NOTE — Clinical Social Work Note (Signed)
CSW visited patient to discuss dc planning. The patient is aware of SNF recommendation, but he has declined and prefers to go home with home health if possible. RNCM is aware. CSW signing off.  Antonio PonderKaren Martha Nyzir Dubois, MSW, Theresia MajorsLCSWA [phone]

## 2016-12-15 NOTE — Consult Note (Signed)
Consultation  Referring Provider:     No ref. provider found Primary Care Physician:  Pcp Not In System Primary Gastroenterologist: None         Reason for Consultation:  Cirrhosis  Date of Admission:  12/14/2016 Date of Consultation:  12/15/2016         HPI:   Levester Waldridge is a 61 y.o. male with Hep C/ETOH cirrhosis diagnosed in 07/2016 after he presented with ascites. He required large volume paracentesis at that time, readmitted in 09/2016 with worsening ascites and was discharged home on diurectics and cipro for possible SBP. He does not have insurance, he ran out of diuretics, presented with recurrence of ascites. He underwent LVP on 12/12/16, 11 lit removed and he did not receive IV albumin. He feels better today. He reported pinkish tinge emesis x 1 yesterday. Denies f/c/frank hematemesis/melena. He has been drinking alcohol, he said he cut back. He is not treated for Hep C yet. He is restarted on diuretics. He denies having any other symptoms of decompensation.  Past Medical History:  Diagnosis Date  . Acid reflux   . Cirrhosis (HCC)   . Hepatitis C   . Hypertension   . Neuropathy (HCC)   . Spinal stenosis     Past Surgical History:  Procedure Laterality Date  . BACK SURGERY      Prior to Admission medications   Medication Sig Start Date End Date Taking? Authorizing Provider  folic acid (FOLVITE) 1 MG tablet Take 1 tablet (1 mg total) by mouth daily. 12/05/16  Yes Sharman Cheek, MD  furosemide (LASIX) 40 MG tablet Take 1 tablet (40 mg total) by mouth 2 (two) times daily. 12/05/16  Yes Sharman Cheek, MD  ibuprofen (ADVIL,MOTRIN) 200 MG tablet Take 200 mg by mouth every 6 (six) hours as needed.   Yes Historical Provider, MD  lisinopril (PRINIVIL,ZESTRIL) 20 MG tablet Take 20 mg by mouth daily.    Yes Historical Provider, MD  omeprazole (PRILOSEC) 20 MG capsule Take 20 mg by mouth daily.   Yes Historical Provider, MD  spironolactone (ALDACTONE) 100 MG tablet Take 1  tablet (100 mg total) by mouth daily. 12/05/16  Yes Sharman Cheek, MD  ciprofloxacin (CIPRO) 500 MG tablet Take 1 tablet (500 mg total) by mouth daily. Patient not taking: Reported on 12/14/2016 12/05/16   Sharman Cheek, MD  HYDROmorphone (DILAUDID) 2 MG tablet 1 tab po every four hours for day1; 1 tab po every six hours for day2; 1 tab every eight hours day3; 1 tab every twelve hours day4; one tab daily for daily day5,6 Patient not taking: Reported on 12/14/2016 10/03/16   Alford Highland, MD  lactulose (CHRONULAC) 10 GM/15ML solution Take 45 mLs (30 g total) by mouth 2 (two) times daily. Patient not taking: Reported on 12/14/2016 12/05/16   Sharman Cheek, MD  thiamine 100 MG tablet Take 1 tablet (100 mg total) by mouth daily. Patient not taking: Reported on 12/14/2016 12/05/16   Sharman Cheek, MD    Family History  Problem Relation Age of Onset  . Hypertension Mother   . Arthritis-Osteo Mother      Social History  Substance Use Topics  . Smoking status: Former Games developer  . Smokeless tobacco: Never Used  . Alcohol use 2.4 oz/week    4 Glasses of wine per week    Allergies as of 12/14/2016  . (No Known Allergies)    Review of Systems:    All systems reviewed and negative except where noted  in HPI.   Physical Exam:  Vital signs in last 24 hours: Temp:  [97.6 F (36.4 C)-98.2 F (36.8 C)] 97.6 F (36.4 C) (02/24 0512) Pulse Rate:  [69-94] 87 (02/24 0512) Resp:  [11-20] 20 (02/24 0512) BP: (109-144)/(65-86) 109/70 (02/24 0512) SpO2:  [93 %-99 %] 93 % (02/24 0512) Weight:  [93.7 kg (206 lb 9 oz)-102.1 kg (225 lb)] 93.7 kg (206 lb 9 oz) (02/23 1838) Last BM Date: 12/13/16 General:   Pleasant, cooperative in NAD Head:  Normocephalic and atraumatic. Eyes:   No icterus.   Conjunctiva pink. PERRLA. Ears:  Normal auditory acuity. Neck:  Supple; no masses or thyroidomegaly Lungs: Respirations even and unlabored. Lungs clear to auscultation bilaterally.   No wheezes, crackles,  or rhonchi.  Heart:  Regular rate and rhythm;  Without murmur, clicks, rubs or gallops Abdomen:  Soft, distended, nontender. Normal bowel sounds. No appreciable masses or hepatomegaly.  No rebound or guarding.  Rectal:  Not performed. Msk:  Symmetrical without gross deformities. Has muscle wasting of upper extremities Extremities:  Without edema, cyanosis or clubbing. Neurologic:  Alert and oriented x3;  grossly normal neurologically, no asterexis Skin:  Intact without significant lesions or rashes. Cervical Nodes:  No significant cervical adenopathy. Psych:  Alert and cooperative. Normal affect.  LAB RESULTS:  Recent Labs  12/14/16 1054 12/15/16 0626  WBC 2.7* 3.1*  HGB 13.8 11.9*  HCT 39.7* 34.5*  PLT 119* 57*   BMET  Recent Labs  12/14/16 1145 12/15/16 0626  NA 138 133*  K 3.5 4.0  CL 101 102  CO2 28 25  GLUCOSE 116* 158*  BUN 7 7  CREATININE 0.63 0.55*  CALCIUM 8.2* 7.5*   LFT  Recent Labs  12/14/16 1145  PROT 7.8  ALBUMIN 3.2*  AST 128*  ALT 43  ALKPHOS 66  BILITOT 2.2*   PT/INR  Recent Labs  12/14/16 1145  LABPROT 16.3*  INR 1.30    STUDIES: No results found.    Impression / Plan:   Ethelene BrownsDonald Fabiano is a 61 y.o. y/o male with decompensated cirrhosis secondary to Hep C and cirrhosis, has large volume ascites, recurrent due to non-adherence to medication. He doe not have any evidence of SBP based on fluid analysis. I would not recommend long term antibiotics. He does not have insurance, reports he applied to medicaid. He needs to be treated for Hep C if active. He may have some subacute GI bleed secondary to portal gastropathy, but no active bleeding at present. No evidence of HCC from CT in 09/2016. No other signs of hepatic decompensation.  - Recommend to start lasix 40mg  and spironolactone 100mg  daily - Recommend to administer IV albumin 8gm/every lit of fluid removed (approx 50gm bottle) during every paracenterioss to prevent AKI - 2 gm  sodium restriction - NPO Sunday past midnight - EGD Monday for variceal screening - Check Hep C viral load and genotype - DO not recommend long term SBP prophylaxis - Chronic cirrhosis care as out pt  Thank you for involving me in the care of this patient.      LOS: 1 day   Lannette Donathohini Kolston Lacount, MD  12/15/2016, 8:39 AM

## 2016-12-15 NOTE — Evaluation (Signed)
Physical Therapy Evaluation Patient Details Name: Antonio Ryan MRN: [id number] DOB: 1955-12-29 Today's Date: 12/15/2016   History of Present Illness  Benard Minturn  is a 61 y.o. male with a known history of Hepatitis C and EtOH related liver cirrhosis, SPEP and essential hypertension who presents with complaints of increasing abdominal girth which results in abdominal pain over the last several weeks. He ran out of medications a few weeks ago. He denies fever but has had some chills and nausea. He denies coffee-ground emesis or melena. He does report that yesterday he had episode of vomiting food with pink tinged blood. He continues to drink bottles of wine almost on a daily basis. He has not undergone EGD or colonoscopy. In the emergency room due to concern of SBP he had paracentesis and was started on ceftriaxone. He was also started octreotide and Protonix drip due to possible upper GI bleed. Again patient reports some pink tinge vomit yesterday and this morning, however denies bright red blood per rectum, melena or frank hematemesis.  Clinical Impression  Pt admitted with above diagnosis. Pt currently with functional limitations due to the deficits listed below (see PT Problem List). Pt lethargic during evaluation and reports increase in nausea with position changes. He ambulates with wide BOS and decreased step length. Walker in front of BOS with forward trunk leaning. Pt lethargic and during ambulation reports feeling dizzy and with increased LE weakness. Further ambulation deferred due to safety concerns. At this time pt is unsafe to return home and would need SNF placement. As clinical condition improves hopefully discharge disposition can be updated and pt would be able to return home. He is a poor SNF candidate due to history of ETOH abuse and it is also unclear whether he would agree to placement. Will update discharge recommendations as appropriate. Pt will benefit from skilled PT services  to address deficits in strength, balance, and mobility in order to return to full function at home.       Follow Up Recommendations SNF;Other (comment) (Update as appropriate. Poor SNF candidate, hx of ETOH abuse)    Equipment Recommendations  None recommended by PT;Other (comment) (Should use rolling walker at discharge)    Recommendations for Other Services       Precautions / Restrictions Precautions Precautions: Fall Restrictions Weight Bearing Restrictions: No      Mobility  Bed Mobility Overal bed mobility: Modified Independent             General bed mobility comments: Heavy UE use on bed rails, HOB elevated, increased time required. Once upright at EOB pt reports increase in nausea. Has a few dry heaves and productive coughs but never vomits. Symptoms eventually improve  Transfers Overall transfer level: Needs assistance Equipment used: Rolling walker (2 wheeled) Transfers: Sit to/from Stand Sit to Stand: Min guard         General transfer comment: Pt requires increased time and demonstrates bilateral LE weakness. Poor standing balance with UE support required to stabilize. Pt is lethargic during transfers.  Ambulation/Gait Ambulation/Gait assistance: Min assist Ambulation Distance (Feet): 20 Feet Assistive device: Rolling walker (2 wheeled) Gait Pattern/deviations: Decreased step length - left;Decreased step length - right;Wide base of support Gait velocity: Decreaed Gait velocity interpretation: <1.8 ft/sec, indicative of risk for recurrent falls General Gait Details: Pt ambulates with wide BOS and decreased step length. Walker in front of BOS with forward trunk leaning. Pt lethargic and during ambulation reports feeling dizzy and with increased LE weakness. Further ambulation deferred  at this time due to safety concerns.   Stairs            Wheelchair Mobility    Modified Rankin (Stroke Patients Only)       Balance Overall balance  assessment: History of Falls                                           Pertinent Vitals/Pain Pain Assessment: 0-10 Pain Score: 10-Worst pain ever Pain Location: Lower abdominal and chest pain x multiple weeks Pain Descriptors / Indicators: Sharp Pain Intervention(s): Monitored during session    Home Living Family/patient expects to be discharged to:: Private residence Living Arrangements: Spouse/significant other Available Help at Discharge: Family;Other (Comment) (Girlfriend) Type of Home: Mobile home Home Access: Level entry     Home Layout: One level Home Equipment: Walker - 2 wheels;Cane - single point      Prior Function Level of Independence: Needs assistance   Gait / Transfers Assistance Needed: Modified independent with use of spc for limited distances. 2 falls in the last 12 months  ADL's / Homemaking Assistance Needed: Girlfriend has been assisting with bathing due to poor balance. Assist required for IADLs        Hand Dominance   Dominant Hand: Right    Extremity/Trunk Assessment   Upper Extremity Assessment Upper Extremity Assessment: Generalized weakness    Lower Extremity Assessment Lower Extremity Assessment: Generalized weakness       Communication   Communication: No difficulties  Cognition Arousal/Alertness: Awake/alert Behavior During Therapy: WFL for tasks assessed/performed Overall Cognitive Status: Within Functional Limits for tasks assessed                      General Comments      Exercises     Assessment/Plan    PT Assessment Patient needs continued PT services  PT Problem List Decreased strength;Decreased activity tolerance;Decreased balance;Decreased safety awareness;Decreased knowledge of use of DME;Decreased mobility;Pain       PT Treatment Interventions DME instruction;Gait training;Stair training;Functional mobility training;Therapeutic activities;Neuromuscular re-education;Balance  training;Therapeutic exercise;Patient/family education    PT Goals (Current goals can be found in the Care Plan section)  Acute Rehab PT Goals Patient Stated Goal: Return to prior level of function PT Goal Formulation: With patient Time For Goal Achievement: 12/29/16 Potential to Achieve Goals: Fair    Frequency Min 2X/week   Barriers to discharge        Co-evaluation               End of Session Equipment Utilized During Treatment: Gait belt Activity Tolerance: Patient limited by lethargy;Other (comment) (Dizziness) Patient left: in bed;with call bell/phone within reach;with bed alarm set Nurse Communication: Patient requests pain meds PT Visit Diagnosis: Unsteadiness on feet (R26.81);Muscle weakness (generalized) (M62.81)         Time: 5409-81190823-0840 PT Time Calculation (min) (ACUTE ONLY): 17 min   Charges:   PT Evaluation $PT Eval Low Complexity: 1 Procedure     PT G Codes:        Sharalyn InkJason D Huprich PT, DPT   Huprich,Jason 12/15/2016, 9:39 AM

## 2016-12-15 NOTE — Progress Notes (Signed)
[facility] - Day at [facility]   PATIENT NAME: Antonio Ryan    MR#:  [id number]  DATE OF BIRTH:  Dec 12, 1955  SUBJECTIVE:   Pt. Here due to abdominal pain.  S/p US guided paracentesis yesterday and had 11 L of fluid removed.  Fluid does not appear to be infected.  No N/V or fever.   REVIEW OF SYSTEMS:    Review of Systems  Constitutional: Negative for chills and fever.  HENT: Negative for congestion and tinnitus.   Eyes: Negative for blurred vision and double vision.  Respiratory: Negative for cough, shortness of breath and wheezing.   Cardiovascular: Negative for chest pain, orthopnea and PND.  Gastrointestinal: Positive for abdominal pain. Negative for diarrhea, nausea and vomiting.  Genitourinary: Negative for dysuria and hematuria.  Neurological: Negative for dizziness, sensory change and focal weakness.  All other systems reviewed and are negative.   Nutrition: Heart healthy Tolerating Diet: Yes Tolerating PT: Await Eval.   DRUG ALLERGIES:  No Known Allergies  VITALS:  Blood pressure 122/65, pulse 87, temperature 97.6 F (36.4 C), temperature source Oral, resp. rate 20, height 6\' 1"  (1.854 m), weight 93.7 kg (206 lb 9 oz), SpO2 93 %.  PHYSICAL EXAMINATION:   Physical Exam  GENERAL:  61 y.o.-year-old patient lying in the bed in no acute distress.  EYES: Pupils equal, round, reactive to light and accommodation. No scleral icterus. Extraocular muscles intact.  HEENT: Head atraumatic, normocephalic. Oropharynx and nasopharynx clear.  NECK:  Supple, no jugular venous distention. No thyroid enlargement, no tenderness.  LUNGS: Normal breath sounds bilaterally, no wheezing, rales, rhonchi. No use of accessory muscles of respiration.  CARDIOVASCULAR: S1, S2 normal. No murmurs, rubs, or gallops.  ABDOMEN: Soft, tender diffusely but no rebound, rigidity, distended, + fluid wave. Bowel sounds present. No organomegaly or mass.  EXTREMITIES: No cyanosis,  clubbing or edema b/l.    NEUROLOGIC: Cranial nerves II through XII are intact. No focal Motor or sensory deficits b/l.   PSYCHIATRIC: The patient is alert and oriented x 3.  SKIN: No obvious rash, lesion, or ulcer.    LABORATORY PANEL:   CBC  Recent Labs Lab 12/15/16 0626  WBC 3.1*  HGB 11.9*  HCT 34.5*  PLT 57*   ------------------------------------------------------------------------------------------------------------------  Chemistries   Recent Labs Lab 12/14/16 1145 12/15/16 0626  NA 138 133*  K 3.5 4.0  CL 101 102  CO2 28 25  GLUCOSE 116* 158*  BUN 7 7  CREATININE 0.63 0.55*  CALCIUM 8.2* 7.5*  AST 128*  --   ALT 43  --   ALKPHOS 66  --   BILITOT 2.2*  --    ------------------------------------------------------------------------------------------------------------------  Cardiac Enzymes No results for input(s): TROPONINI in the last 168 hours. ------------------------------------------------------------------------------------------------------------------  RADIOLOGY:  No results found.   ASSESSMENT AND PLAN:   61 year old male with past medical history of alcoholic liver cirrhosis, history of spinal stenosis, hypertension, hepatitis C, GERD who presents to the hospital due to abdominal pain.  1. Abdominal pain-secondary to ascites. Patient is status post large volume paracentesis with 11-12 L of fluid removed. -Fluid analysis is negative for SBP. Continue empiric ceftriaxone for now. Continue supportive care. Await gastroenterology input.  2. Suspected upper GI bleed-patient's hemoglobin is stable. He is not having any hematemesis or hemoptysis. -I will discontinue octreotide and Protonix. Place him on IV Protonix twice a day. -Await gastroenterology input.  3. History of alcohol abuse-continue CIWA protocol.  4. History of hepatic encephalopathy-continue lactulose.  5.  Essential HTN - cont. Lisinopril  6. Anemia/Thrombocytopenia - due to  chronic Liver disease. Follow counts. No need for urgent transfusion.   All the records are reviewed and case discussed with Care Management/Social Worker. Management plans discussed with the patient, family and they are in agreement.  CODE STATUS: full code  DVT Prophylaxis: TED's & SCD's  TOTAL TIME TAKING CARE OF THIS PATIENT: 30 minutes.   POSSIBLE D/C IN 1-2 DAYS, DEPENDING ON CLINICAL CONDITION.   [place] SirenSAINANI,VIVEK J M.D on 12/15/2016 at 12:54 PM  Between 7am to 6pm - Pager - [phone]  After 6pm go to [email or web address] - Social research officer, governmentpassword EPAS [email or web address]  [facility] [place] [facility]  Office  [phone]  CC: Primary care physician; Pcp Not In System

## 2016-12-16 LAB — CBC
HCT: 41 % (ref 40.0–52.0)
Hemoglobin: 14.3 g/dL (ref 13.0–18.0)
MCH: 35.3 pg — AB (ref 26.0–34.0)
MCHC: 34.9 g/dL (ref 32.0–36.0)
MCV: 101.1 fL — ABNORMAL HIGH (ref 80.0–100.0)
PLATELETS: 69 10*3/uL — AB (ref 150–440)
RBC: 4.06 MIL/uL — ABNORMAL LOW (ref 4.40–5.90)
RDW: 14 % (ref 11.5–14.5)
WBC: 4 10*3/uL (ref 3.8–10.6)

## 2016-12-16 LAB — HEPATITIS C VRS RNA DETECT BY PCR-QUAL: Hepatitis C Vrs RNA by PCR-Qual: POSITIVE — AB

## 2016-12-16 MED ORDER — SODIUM CHLORIDE 0.9 % IV SOLN: INTRAVENOUS | Status: DC

## 2016-12-16 NOTE — Progress Notes (Signed)
IV ativan given- tachy, HTN, nausea, reports being shaky. Denies headache or sweating. Offered gingerale or crackers- he declined. Emesis looks like spit up- no measurable amount. Cool wash rag applied to forehead. Will continue to monitor.  Louis MeckelMcRae, Terel Bann H

## 2016-12-16 NOTE — Care Management Note (Signed)
Case Management Note  Patient Details  Name: Antonio Ryan MRN: [id number] Date of Birth: 01/14/1956  Subjective/Objective:      [place] [facility] [name] with the phone number and address of [facility] [place] where he can call tomorrow for a same day appointment for medication assistance.               Action/Plan:   Expected Discharge Date:                  Expected Discharge Plan:     In-House Referral:     Discharge planning Services     Post Acute Care Choice:    Choice offered to:     DME Arranged:    DME Agency:     HH Arranged:    HH Agency:     Status of Service:     If discussed at [employer] Length of Stay Meetings, dates discussed:    Additional Comments:  Severa Jeremiah A, RN 12/16/2016, 12:38 PM

## 2016-12-16 NOTE — Progress Notes (Signed)
[facility] - [place] [facility]   PATIENT NAME: Antonio Ryan    MR#:  [id number]  DATE OF BIRTH:  07-09-1956  SUBJECTIVE:   Still having some abdominal pain.  No N/V.  Going for Endoscopy/Colonscopy tomorrow.    REVIEW OF SYSTEMS:    Review of Systems  Constitutional: Negative for chills and fever.  HENT: Negative for congestion and tinnitus.   Eyes: Negative for blurred vision and double vision.  Respiratory: Negative for cough, shortness of breath and wheezing.   Cardiovascular: Negative for chest pain, orthopnea and PND.  Gastrointestinal: Positive for abdominal pain. Negative for diarrhea, nausea and vomiting.  Genitourinary: Negative for dysuria and hematuria.  Neurological: Negative for dizziness, sensory change and focal weakness.  All other systems reviewed and are negative.   Nutrition: Heart healthy Tolerating Diet: Yes Tolerating PT: Eval noted.   DRUG ALLERGIES:  No Known Allergies  VITALS:  Blood pressure 104/63, pulse (!) 106, temperature 98.1 F (36.7 C), temperature source Oral, resp. rate 20, height 6\' 1"  (1.854 m), weight 93.7 kg (206 lb 9 oz), SpO2 95 %.  PHYSICAL EXAMINATION:   Physical Exam  GENERAL:  61 y.o.-year-old patient lying in the bed in no acute distress.  EYES: Pupils equal, round, reactive to light and accommodation. No scleral icterus. Extraocular muscles intact.  HEENT: Head atraumatic, normocephalic. Oropharynx and nasopharynx clear.  NECK:  Supple, no jugular venous distention. No thyroid enlargement, no tenderness.  LUNGS: Normal breath sounds bilaterally, no wheezing, rales, rhonchi. No use of accessory muscles of respiration.  CARDIOVASCULAR: S1, S2 normal. No murmurs, rubs, or gallops.  ABDOMEN: Soft, tender diffusely but no rebound, rigidity, distended, + fluid wave. Bowel sounds present. No organomegaly or mass.  EXTREMITIES: No cyanosis, clubbing or edema b/l.    NEUROLOGIC: Cranial nerves II through XII  are intact. No focal Motor or sensory deficits b/l.   PSYCHIATRIC: The patient is alert and oriented x 3.  SKIN: No obvious rash, lesion, or ulcer.    LABORATORY PANEL:   CBC  Recent Labs Lab 12/16/16 0643  WBC 4.0  HGB 14.3  HCT 41.0  PLT 69*   ------------------------------------------------------------------------------------------------------------------  Chemistries   Recent Labs Lab 12/14/16 1145 12/15/16 0626  NA 138 133*  K 3.5 4.0  CL 101 102  CO2 28 25  GLUCOSE 116* 158*  BUN 7 7  CREATININE 0.63 0.55*  CALCIUM 8.2* 7.5*  AST 128*  --   ALT 43  --   ALKPHOS 66  --   BILITOT 2.2*  --    ------------------------------------------------------------------------------------------------------------------  Cardiac Enzymes No results for input(s): TROPONINI in the last 168 hours. ------------------------------------------------------------------------------------------------------------------  RADIOLOGY:  No results found.   ASSESSMENT AND PLAN:   61 year old male with past medical history of alcoholic liver cirrhosis, history of spinal stenosis, hypertension, hepatitis C, GERD who presents to the hospital due to abdominal pain.  1. Abdominal pain-secondary to ascites. Patient is status post large volume paracentesis with 11-12 L of fluid removed. - likely will need another paracentesis prior to discharged and will order for tomorrow.  -Fluid analysis is negative for SBP. Continue empiric ceftriaxone for now. Continue supportive care.  - appreciate GI input and started on Aldactone, lasix.   2. Suspected upper GI bleed-patient's hemoglobin is stable. He is not having any hematemesis or hemoptysis - appreciate GI input and plan for endoscopy in a.m. Tomorrow. Cont. PPI BID.   3. History of alcohol abuse-continue CIWA protocol.  4. History of hepatic  encephalopathy-continue lactulose.  5. Essential HTN - cont. Lisinopril  6. Anemia/Thrombocytopenia -  due to chronic Liver disease. Follow counts. No need for urgent transfusion.   All the records are reviewed and case discussed with Care Management/Social Worker. Management plans discussed with the patient, family and they are in agreement.  CODE STATUS: full code  DVT Prophylaxis: TED's & SCD's  TOTAL TIME TAKING CARE OF THIS PATIENT: 25 minutes.   POSSIBLE D/C IN 1-2 DAYS, DEPENDING ON CLINICAL CONDITION.   [place] SirenSAINANI,Daryn Pisani J M.D on 12/16/2016 at 1:07 PM  Between 7am to 6pm - Pager - [phone]  After 6pm go to [email or web address] - Social research officer, governmentpassword EPAS [email or web address]  [facility]  [facility]ospital[facility]  Office  [phone]  CC: Primary care physician; Pcp Not In System

## 2016-12-16 NOTE — Progress Notes (Signed)
Gave IV ativan for nausea/tremor/anxiety. Pt dry heaving- did not want to give oral. Will continue to monitor. Louis MeckelMcRae, Marilu Rylander H

## 2016-12-17 ENCOUNTER — Inpatient Hospital Stay: Payer: Medicaid Other | Admitting: Anesthesiology

## 2016-12-17 ENCOUNTER — Encounter
Admission: EM | Disposition: A | Discharge status: Home / Self Care | Payer: Self-pay | Source: Home / Self Care | Attending: Specialist

## 2016-12-17 ENCOUNTER — Inpatient Hospital Stay: Payer: Medicaid Other

## 2016-12-17 ENCOUNTER — Encounter: Payer: Self-pay | Admitting: Anesthesiology

## 2016-12-17 DIAGNOSIS — I8500 Esophageal varices without bleeding: Secondary | ICD-10-CM

## 2016-12-17 DIAGNOSIS — K920 Hematemesis: Secondary | ICD-10-CM

## 2016-12-17 HISTORY — PX: ESOPHAGOGASTRODUODENOSCOPY (EGD) WITH PROPOFOL: SHX5813

## 2016-12-17 LAB — BODY FLUID CULTURE: Culture: NO GROWTH

## 2016-12-17 SURGERY — ESOPHAGOGASTRODUODENOSCOPY (EGD) WITH PROPOFOL
Anesthesia: General

## 2016-12-17 MED ORDER — SODIUM CHLORIDE 0.9 % IV SOLN
INTRAVENOUS | Status: DC | PRN
  Administered 2016-12-17: 09:00:00 via INTRAVENOUS

## 2016-12-17 MED ORDER — ALBUMIN HUMAN 25 % IV SOLN
25.0000 g | Freq: Once | INTRAVENOUS | Status: AC
  Administered 2016-12-17: 19:00:00 25 g via INTRAVENOUS
  Filled 2016-12-17: qty 100

## 2016-12-17 MED ORDER — MIDAZOLAM HCL 2 MG/2ML IJ SOLN
INTRAMUSCULAR | Status: DC | PRN
  Administered 2016-12-17: 2 mg via INTRAVENOUS

## 2016-12-17 MED ORDER — PROPOFOL 10 MG/ML IV BOLUS
INTRAVENOUS | Status: DC | PRN
  Administered 2016-12-17: 80 mg via INTRAVENOUS

## 2016-12-17 MED ORDER — PROPOFOL 500 MG/50ML IV EMUL
INTRAVENOUS | Status: DC | PRN
  Administered 2016-12-17: 120 ug/kg/min via INTRAVENOUS

## 2016-12-17 MED ORDER — PHENYLEPHRINE HCL 10 MG/ML IJ SOLN
INTRAMUSCULAR | Status: AC
  Filled 2016-12-17: qty 1

## 2016-12-17 MED ORDER — PROPOFOL 10 MG/ML IV BOLUS
INTRAVENOUS | Status: AC
  Filled 2016-12-17: qty 20

## 2016-12-17 MED ORDER — MIDAZOLAM HCL 2 MG/2ML IJ SOLN
INTRAMUSCULAR | Status: AC
  Filled 2016-12-17: qty 2

## 2016-12-17 MED ORDER — PANTOPRAZOLE SODIUM 40 MG PO TBEC
40.0000 mg | DELAYED_RELEASE_TABLET | Freq: Two times a day (BID) | ORAL | Status: DC
  Administered 2016-12-17 – 2016-12-18 (×2): 40 mg via ORAL
  Filled 2016-12-17 (×2): qty 1

## 2016-12-17 NOTE — Progress Notes (Signed)
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 No active bleed.   Plan   1. Nadolol 20 mg to be commenced prior to discharge 2. Low salt diet  3. No NSAID's 4. Discharge on aldactone and lasix with Out patient GI follow up  5. Stop all alcohol consumption  I will sign off.  Please call me if any further GI concerns or questions.  We would like to thank you for the opportunity to participate in the care of Antonio Ryan.    Dr Wyline MoodKiran Hlee Fringer  Gastroenterology/Hepatology Pager: [phone]

## 2016-12-17 NOTE — Procedures (Signed)
Successful US guided paracentesis yielding 10.5 L of serous ascitic fluid. Sample sent to laboratory as requested. EBL: None No immediate post procedural complications.   (Note, this procedure was performed on 2/23)  Katherina RightJay Tam Savoia, MD Pager #: [phone]

## 2016-12-17 NOTE — Anesthesia Postprocedure Evaluation (Signed)
Anesthesia Post Note  Patient: Ethelene BrownsDonald Mcclaine  Procedure(s) Performed: Procedure(s) (LRB): ESOPHAGOGASTRODUODENOSCOPY (EGD) WITH PROPOFOL (N/A)  Patient location during evaluation: Endoscopy Anesthesia Type: General Level of consciousness: awake and alert Pain management: pain level controlled Vital Signs Assessment: post-procedure vital signs reviewed and stable Respiratory status: spontaneous breathing, nonlabored ventilation, respiratory function stable and patient connected to nasal cannula oxygen Cardiovascular status: blood pressure returned to baseline and stable Postop Assessment: no signs of nausea or vomiting Anesthetic complications: no     Last Vitals:  Vitals:   12/17/16 0919 12/17/16 0929  BP: 110/73 113/75  Pulse: 77 83  Resp: 12 13  Temp:      Last Pain:  Vitals:   12/17/16 0859  TempSrc: Tympanic  PainSc:                  Cleda MccreedyJoseph K Tyrie Porzio

## 2016-12-17 NOTE — Progress Notes (Signed)
CONCERNING: IV to Oral Route Change Policy  RECOMMENDATION: This patient is receiving pantoprazole by the intravenous route.  Based on criteria approved by the Pharmacy and Therapeutics Committee, the intravenous medication(s) is/are being converted to the equivalent oral dose form(s).   DESCRIPTION: These criteria include:  The patient is eating (either orally or via tube) and/or has been taking other orally administered medications for a least 24 hours  The patient has no evidence of active gastrointestinal bleeding or impaired GI absorption (gastrectomy, short bowel, patient on TNA or NPO).  If you have questions about this conversion, please contact the Pharmacy Department  []   [phone]( [phone] )  [facility] [x]   [phone]( [phone] )  [facility] []   [phone]( [phone] )  [facility] []   [phone]( [phone] )  [facility] []   [phone]( [phone] )  [facility]   Simpson,Michael L, [facility] Center[facility]PH 12/17/2016 1:57 PM

## 2016-12-17 NOTE — Progress Notes (Signed)
[facility] [place] [facility]   PATIENT NAME: Antonio Ryan    MR#:  [id number]  DATE OF BIRTH:  09/03/1956  SUBJECTIVE:   Still having some abdominal pain.  S/p Endoscopy showing Grade 1 Varices. No acute bleeding. Going for US guided paracentesis later today.      REVIEW OF SYSTEMS:    Review of Systems  Constitutional: Negative for chills and fever.  HENT: Negative for congestion and tinnitus.   Eyes: Negative for blurred vision and double vision.  Respiratory: Negative for cough, shortness of breath and wheezing.   Cardiovascular: Negative for chest pain, orthopnea and PND.  Gastrointestinal: Positive for abdominal pain. Negative for diarrhea, nausea and vomiting.  Genitourinary: Negative for dysuria and hematuria.  Neurological: Negative for dizziness, sensory change and focal weakness.  All other systems reviewed and are negative.   Nutrition: Heart healthy Tolerating Diet: Yes Tolerating PT: Eval noted.   DRUG ALLERGIES:  No Known Allergies  VITALS:  Blood pressure 110/63, pulse 88, temperature 98.7 F (37.1 C), resp. rate 18, height 6\' 1"  (1.854 m), weight 93.7 kg (206 lb 9 oz), SpO2 94 %.  PHYSICAL EXAMINATION:   Physical Exam  GENERAL:  61 y.o.-year-old patient lying in the bed in no acute distress.  EYES: Pupils equal, round, reactive to light and accommodation. No scleral icterus. Extraocular muscles intact.  HEENT: Head atraumatic, normocephalic. Oropharynx and nasopharynx clear.  NECK:  Supple, no jugular venous distention. No thyroid enlargement, no tenderness.  LUNGS: Normal breath sounds bilaterally, no wheezing, rales, rhonchi. No use of accessory muscles of respiration.  CARDIOVASCULAR: S1, S2 normal. No murmurs, rubs, or gallops.  ABDOMEN: Soft, Slightly tender,  distended, + fluid wave due to ascites Bowel sounds present. No organomegaly or mass.  EXTREMITIES: No cyanosis, clubbing or edema b/l.    NEUROLOGIC: Cranial nerves  II through XII are intact. No focal Motor or sensory deficits b/l.   PSYCHIATRIC: The patient is alert and oriented x 3.  SKIN: No obvious rash, lesion, or ulcer.    LABORATORY PANEL:   CBC  Recent Labs Lab 12/16/16 0643  WBC 4.0  HGB 14.3  HCT 41.0  PLT 69*   ------------------------------------------------------------------------------------------------------------------  Chemistries   Recent Labs Lab 12/14/16 1145 12/15/16 0626  NA 138 133*  K 3.5 4.0  CL 101 102  CO2 28 25  GLUCOSE 116* 158*  BUN 7 7  CREATININE 0.63 0.55*  CALCIUM 8.2* 7.5*  AST 128*  --   ALT 43  --   ALKPHOS 66  --   BILITOT 2.2*  --    ------------------------------------------------------------------------------------------------------------------  Cardiac Enzymes No results for input(s): TROPONINI in the last 168 hours. ------------------------------------------------------------------------------------------------------------------  RADIOLOGY:  No results found.   ASSESSMENT AND PLAN:   61 year old male with past medical history of alcoholic liver cirrhosis, history of spinal stenosis, hypertension, hepatitis C, GERD who presents to the hospital due to abdominal pain.  1. Abdominal pain-secondary to ascites. Patient is status post large volume paracentesis with 11-12 L of fluid removed on 12/14/16 - plan for another large volume paracentesis today. ?? Need to Pleurex cath for palliative purposes.  -Fluid analysis is negative for SBP. Off Abx now.   - appreciate GI input and cont. Aldactone, lasix.   2. Suspected upper GI bleed-patient's hemoglobin is stable. He is not having any hematemesis or hemoptysis -Status post endoscopy today showing grade 1 esophageal varices, portal hypertensive gastropathy but no evidence of acute bleeding. -Continue PPI twice a  day for now and will start on Nadolol upon discharge if tolerated.   3. History of alcohol abuse-continue CIWA protocol.  4.  History of hepatic encephalopathy-continue lactulose.  5. Essential HTN - cont. Lisinopril  6. Anemia/Thrombocytopenia - due to chronic Liver disease. Follow counts. No need for urgent transfusion.   All the records are reviewed and case discussed with Care Management/Social Worker. Management plans discussed with the patient, family and they are in agreement.  CODE STATUS: full code  DVT Prophylaxis: TED's & SCD's  TOTAL TIME TAKING CARE OF THIS PATIENT: 30 minutes.   POSSIBLE D/C IN 1-2 DAYS, DEPENDING ON CLINICAL CONDITION.   Houston Siren M.D on 12/17/2016 at 2:31 PM  Between 7am to 6pm - Pager - [phone]  After 6pm go to [email or web address] - Social research officer, government  [facility] [place] [facility]  Office  [phone]  CC: Primary care physician; Pcp Not In System

## 2016-12-17 NOTE — Transfer of Care (Signed)
Immediate Anesthesia Transfer of Care Note  Patient: Antonio BrownsDonald Turek  Procedure(s) Performed: Procedure(s): ESOPHAGOGASTRODUODENOSCOPY (EGD) WITH PROPOFOL (N/A)  Patient Location: PACU and Endoscopy Unit  Anesthesia Type:General  Level of Consciousness: patient cooperative and lethargic  Airway & Oxygen Therapy: Patient Spontanous Breathing and Patient connected to nasal cannula oxygen  Post-op Assessment: Report given to RN and Post -op Vital signs reviewed and stable  Post vital signs: Reviewed and stable  Last Vitals:  Vitals:   12/17/16 0859 12/17/16 0900  BP: 94/61 94/62  Pulse:  82  Resp: (!) 81 16  Temp: (!) 35.7 C     Last Pain:  Vitals:   12/17/16 0859  TempSrc: Tympanic  PainSc:          Complications: No apparent anesthesia complications

## 2016-12-17 NOTE — Progress Notes (Signed)
Upper Endo complete. Awake, oriented. Transported back to room via bed. No family present

## 2016-12-17 NOTE — H&P (Signed)
Wyline MoodKiran Cosimo Schertzer MD [address] [place], [place] [address] Phone: [phone] Fax : [phone]  Primary Care Physician:  Pcp Not In System Primary Gastroenterologist:  Dr. Wyline MoodKiran Kadeidra Coryell   Pre-Procedure History & Physical: HPI:  Antonio Ryan is a 61 y.o. male is here for an endoscopy and possible banding .   Past Medical History:  Diagnosis Date  . Acid reflux   . Cirrhosis (HCC)   . Hepatitis C   . Hypertension   . Neuropathy (HCC)   . Spinal stenosis     Past Surgical History:  Procedure Laterality Date  . BACK SURGERY      Prior to Admission medications   Medication Sig Start Date End Date Taking? Authorizing Provider  folic acid (FOLVITE) 1 MG tablet Take 1 tablet (1 mg total) by mouth daily. 12/05/16  Yes Sharman CheekPhillip Stafford, MD  furosemide (LASIX) 40 MG tablet Take 1 tablet (40 mg total) by mouth 2 (two) times daily. 12/05/16  Yes Sharman CheekPhillip Stafford, MD  ibuprofen (ADVIL,MOTRIN) 200 MG tablet Take 200 mg by mouth every 6 (six) hours as needed.   Yes Historical Provider, MD  lisinopril (PRINIVIL,ZESTRIL) 20 MG tablet Take 20 mg by mouth daily.    Yes Historical Provider, MD  omeprazole (PRILOSEC) 20 MG capsule Take 20 mg by mouth daily.   Yes Historical Provider, MD  spironolactone (ALDACTONE) 100 MG tablet Take 1 tablet (100 mg total) by mouth daily. 12/05/16  Yes Sharman CheekPhillip Stafford, MD  ciprofloxacin (CIPRO) 500 MG tablet Take 1 tablet (500 mg total) by mouth daily. Patient not taking: Reported on 12/14/2016 12/05/16   Sharman CheekPhillip Stafford, MD  HYDROmorphone (DILAUDID) 2 MG tablet 1 tab po every four hours for day1; 1 tab po every six hours for day2; 1 tab every eight hours day3; 1 tab every twelve hours day4; one tab daily for daily day5,6 Patient not taking: Reported on 12/14/2016 10/03/16   Alford Highlandichard Wieting, MD  lactulose (CHRONULAC) 10 GM/15ML solution Take 45 mLs (30 g total) by mouth 2 (two) times daily. Patient not taking: Reported on 12/14/2016 12/05/16   Sharman CheekPhillip Stafford,  MD  thiamine 100 MG tablet Take 1 tablet (100 mg total) by mouth daily. Patient not taking: Reported on 12/14/2016 12/05/16   Sharman CheekPhillip Stafford, MD    Allergies as of 12/14/2016  . (No Known Allergies)    Family History  Problem Relation Age of Onset  . Hypertension Mother   . Arthritis-Osteo Mother     Social History   Social History  . Marital status: Divorced    Spouse name: N/A  . Number of children: N/A  . Years of education: N/A   Occupational History  . disabled    Social History Main Topics  . Smoking status: Former Games developermoker  . Smokeless tobacco: Never Used  . Alcohol use 2.4 oz/week    4 Glasses of wine per week  . Drug use: No  . Sexual activity: Not on file   Other Topics Concern  . Not on file   Social History Narrative  . No narrative on file    Review of Systems: See HPI, otherwise negative ROS  Physical Exam: BP 110/68   Pulse 81   Temp 98.9 F (37.2 C)   Resp 16   Ht 6\' 1"  (1.854 m)   Wt 206 lb 9 oz (93.7 kg)   SpO2 93%   BMI 27.25 kg/m  General:   Alert,  pleasant and cooperative in NAD Head:  Normocephalic and atraumatic.  Neck:  Supple; no masses or thyromegaly. Lungs:  Clear throughout to auscultation.    Heart:  Regular rate and rhythm. Abdomen:  Soft, nontender and nondistended. Normal bowel sounds, without guarding, and without rebound.   Neurologic:  Alert and  oriented x4;  grossly normal neurologically.  Impression/Plan: Antonio Ryan is here for an endoscopy and possible banding to be performed for esophageal varices  Risks, benefits, limitations, and alternatives regarding  endoscopy and possible banding  have been reviewed with the patient.  Questions have been answered.  All parties agreeable.   Wyline Mood, MD  12/17/2016, 8:19 AM

## 2016-12-17 NOTE — Op Note (Signed)
[facility] Patient Name: Antonio Ryan Procedure Date: 12/17/2016 8:43 AM MRN: [id number] Account #: [id number] Date of Birth: 09/20/1956 Admit Type: Inpatient Age: 61 Room: [facility] ENDO ROOM 4 Gender: Male Note Status: Finalized Procedure:            Upper GI endoscopy Indications:          Hematemesis Providers:            Wyline Mood MD, MD Referring MD:         No Local Md, MD (Referring MD) Medicines:            Monitored Anesthesia Care Complications:        No immediate complications. Procedure:            Pre-Anesthesia Assessment:                       - ASA Grade Assessment: III - A patient with severe                        systemic disease.                       - Prior to the procedure, a History and Physical was                        performed, and patient medications, allergies and                        sensitivities were reviewed. The patient's tolerance of                        previous anesthesia was reviewed.                       - The risks and benefits of the procedure and the                        sedation options and risks were discussed with the                        patient. All questions were answered and informed                        consent was obtained.                       - Patient identification and proposed procedure were                        verified prior to the procedure by the physician. The                        procedure was verified in the procedure room in the                        endoscopy suite.                       After obtaining informed consent, the endoscope was  passed under direct vision. Throughout the procedure,                        the patient's blood pressure, pulse, and oxygen                        saturations were monitored continuously. The Endoscope                        was introduced through the mouth, and advanced to the                         third part of duodenum. The upper GI endoscopy was                        accomplished with ease. The patient tolerated the                        procedure well. Findings:      The examined duodenum was normal.      Mild portal hypertensive gastropathy was found in the entire examined       stomach.      Grade I varices were found in the lower third of the esophagus. They       were small in size. No stigmata of recent bleed. No banding performed Impression:           - Normal examined duodenum.                       - Portal hypertensive gastropathy.                       - Grade I esophageal varices.                       - No specimens collected. Recommendation:       - Return patient to hospital ward for ongoing care.                       - Advance diet as tolerated today.                       - Continue present medications.                       - Give a beta blocker with dosage titrated by the heart                        rate.                       - Suggest Nadolol 20 mg once daily prior to discharge                        with PCP to follow up to check BP after discharge.                       - Return to GI office in 2 weeks. Procedure Code(s):    --- Professional ---  16109, Esophagogastroduodenoscopy, flexible, transoral;                        diagnostic, including collection of specimen(s) by                        brushing or washing, when performed (separate procedure) Diagnosis Code(s):    --- Professional ---                       K76.6, Portal hypertension                       K31.89, Other diseases of stomach and duodenum                       I85.00, Esophageal varices without bleeding                       K92.0, Hematemesis CPT copyright 2016 American Medical Association. All rights reserved. The codes documented in this report are preliminary and upon coder review may  be revised to meet current compliance requirements. Wyline Mood,  MD Wyline Mood MD, MD 12/17/2016 8:55:48 AM This report has been signed electronically. Number of Addenda: 0 Note Initiated On: 12/17/2016 8:43 AM      [facility]

## 2016-12-17 NOTE — Anesthesia Preprocedure Evaluation (Signed)
Anesthesia Evaluation  Patient identified by MRN, date of birth, ID band Patient awake    Reviewed: Allergy & Precautions, H&P , NPO status , Patient's Chart, lab work & pertinent test results  History of Anesthesia Complications Negative for: history of anesthetic complications  Airway Mallampati: III  TM Distance: >3 FB Neck ROM: full    Dental  (+) Poor Dentition, Chipped, Missing   Pulmonary neg shortness of breath, former smoker,    Pulmonary exam normal breath sounds clear to auscultation       Cardiovascular Exercise Tolerance: Good hypertension, (-) angina(-) Past MI and (-) DOE Normal cardiovascular exam Rhythm:regular Rate:Normal     Neuro/Psych negative neurological ROS  negative psych ROS   GI/Hepatic GERD  Controlled,(+) Cirrhosis       , Hepatitis -, C  Endo/Other  negative endocrine ROS  Renal/GU negative Renal ROS  negative genitourinary   Musculoskeletal   Abdominal   Peds  Hematology negative hematology ROS (+)   Anesthesia Other Findings Past Medical History: No date: Acid reflux No date: Cirrhosis (HCC) No date: Hepatitis C No date: Hypertension No date: Neuropathy (HCC) No date: Spinal stenosis  Past Surgical History: No date: BACK SURGERY  BMI    Body Mass Index:  27.25 kg/m      Reproductive/Obstetrics negative OB ROS                             Anesthesia Physical Anesthesia Plan  ASA: III  Anesthesia Plan: General   Post-op Pain Management:    Induction:   Airway Management Planned:   Additional Equipment:   Intra-op Plan:   Post-operative Plan:   Informed Consent: I have reviewed the patients History and Physical, chart, labs and discussed the procedure including the risks, benefits and alternatives for the proposed anesthesia with the patient or authorized representative who has indicated his/her understanding and acceptance.    Dental Advisory Given  Plan Discussed with: Anesthesiologist, CRNA and Surgeon  Anesthesia Plan Comments:         Anesthesia Quick Evaluation

## 2016-12-17 NOTE — Anesthesia Post-op Follow-up Note (Cosign Needed)
Anesthesia QCDR form completed.        

## 2016-12-18 ENCOUNTER — Encounter: Payer: Self-pay | Admitting: Gastroenterology

## 2016-12-18 LAB — BASIC METABOLIC PANEL
Anion gap: 5 (ref 5–15)
BUN: 12 mg/dL (ref 6–20)
CO2: 28 mmol/L (ref 22–32)
CREATININE: 0.79 mg/dL (ref 0.61–1.24)
Calcium: 7.9 mg/dL — ABNORMAL LOW (ref 8.9–10.3)
Chloride: 96 mmol/L — ABNORMAL LOW (ref 101–111)
GFR calc Af Amer: >60 mL/min (ref >60)
GFR calc non Af Amer: >60 mL/min (ref >60)
GLUCOSE: 98 mg/dL (ref 65–99)
POTASSIUM: 3.4 mmol/L — AB (ref 3.5–5.1)
Sodium: 129 mmol/L — ABNORMAL LOW (ref 135–145)

## 2016-12-18 LAB — MAGNESIUM: Magnesium: 1.7 mg/dL (ref 1.7–2.4)

## 2016-12-18 MED ORDER — LACTULOSE 10 GM/15ML PO SOLN: 45.0000 g | Freq: Two times a day (BID) | ORAL | 1 refills | Status: DC

## 2016-12-18 MED ORDER — NADOLOL 20 MG PO TABS: 20.0000 mg | ORAL_TABLET | Freq: Every day | ORAL | 1 refills | Status: DC

## 2016-12-18 MED ORDER — SPIRONOLACTONE 100 MG PO TABS: 100.0000 mg | ORAL_TABLET | Freq: Every day | ORAL | 1 refills | Status: DC

## 2016-12-18 MED ORDER — FUROSEMIDE 40 MG PO TABS: 40.0000 mg | ORAL_TABLET | Freq: Two times a day (BID) | ORAL | 1 refills | Status: AC

## 2016-12-18 MED ORDER — PANTOPRAZOLE SODIUM 40 MG PO TBEC: 40.0000 mg | DELAYED_RELEASE_TABLET | Freq: Two times a day (BID) | ORAL | 1 refills | Status: DC

## 2016-12-18 MED ORDER — SULFAMETHOXAZOLE-TRIMETHOPRIM 800-160 MG PO TABS: 1.0000 | ORAL_TABLET | Freq: Every day | ORAL | 1 refills | Status: DC

## 2016-12-18 NOTE — Progress Notes (Signed)
Pt to discharge home. Alert. No resp distress.  Sl x2 d/cd. Instructions discussed with pt.  caremanager brenda faxed pts  presc to med management.  meds discussed/ diet activity and f/u  Discussed.  Pt calling  Someone to pick him up.

## 2016-12-18 NOTE — Discharge Instructions (Signed)
Ascites Ascites is a collection of excess fluid in the abdomen. Ascites can range from mild to severe. It can get worse without treatment. What are the causes? Possible causes include:  Cirrhosis. This is the most common cause of ascites.  Infection or inflammation in the abdomen.  Cancer in the abdomen.  Heart failure.  Kidney disease.  Inflammation of the pancreas.  Clots in the veins of the liver. What are the signs or symptoms? Signs and symptoms may include:  A feeling of fullness in your abdomen. This is common.  An increase in the size of your abdomen or your waist.  Swelling in your legs.  Swelling of the scrotum in men.  Difficulty breathing.  Abdominal pain.  Sudden weight gain. If the condition is mild, you may not have symptoms. How is this diagnosed? To make a diagnosis, your health care provider will:  Ask about your medical history.  Perform a physical exam.  Order imaging tests, such as an ultrasound or CT scan of your abdomen. How is this treated? Treatment depends on the cause of the ascites. It may include:  Taking a pill to make you urinate. This is called a water pill (diuretic pill).  Strictly reducing your salt (sodium) intake. Salt can cause extra fluid to be kept in the body, and this makes ascites worse.  Having a procedure to remove fluid from your abdomen (paracentesis).  Having a procedure to transfer fluid from your abdomen into a vein.  Having a procedure that connects two of the major veins within your liver and relieves pressure on your liver (TIPS procedure). Ascites may go away or improve with treatment of the condition that caused it. Follow these instructions at home:  Keep track of your weight. To do this, weigh yourself at the same time every day and record your weight.  Keep track of how much you drink and any changes in the amount you urinate.  Follow any instructions that your health care provider gives you  about how much to drink.  Try not to eat salty (high-sodium) foods.  Take medicines only as directed by your health care provider.  Keep all follow-up visits as directed by your health care provider. This is important.  Report any changes in your health to your health care provider, especially if you develop new symptoms or your symptoms get worse. Contact a health care provider if:  Your gain more than 3 pounds in 3 days.  Your abdominal size or your waist size increases.  You have new swelling in your legs.  The swelling in your legs gets worse. Get help right away if:  You develop a fever.  You develop confusion.  You develop new or worsening difficulty breathing.  You develop new or worsening abdominal pain.  You develop new or worsening swelling in the scrotum (in men). This information is not intended to replace advice given to you by your health care provider. Make sure you discuss any questions you have with your health care provider. Document Released: 10/08/2005 Document Revised: 02/15/2016 Document Reviewed: 05/07/2014 Elsevier Interactive Patient Education  2017 [employer]  Alcoholic Liver Disease The liver is an organ that converts food into energy, absorbs vitamins from food, removes toxins from the blood, and makes proteins. Alcoholic liver disease happens when the liver becomes damaged due to alcohol consumption and it stops working properly. What are the causes? This condition is caused by drinking too much alcohol over a number of years. What increases  the risk? This condition is more likely to develop in:  Women.  People who have a family history of the disease.  People who have poor nutrition.  People who are obese. What are the signs or symptoms? Early symptoms of this condition include:  Fatigue.  Weakness.  Abdominal discomfort on the upper right side. Symptoms of moderate disease include:  Fever.  Yellow, pale, or darkening  skin.  Abdominal pain.  Nausea and vomiting.  Weight loss.  Loss of appetite. Symptoms of advanced disease include:  Abdominal swelling.  Nosebleeds or bleeding gums.  Itchy skin.  Enlarged fingertips (clubbing).  Light-colored stools that smell very bad (steatorrhea).  Mood changes.  Feeling agitated.  Confusion.  Trouble concentrating. Some people do not have symptoms until the condition becomes severe. Symptoms are often worse after heavy drinking. How is this diagnosed? This condition is diagnosed with:  A physical exam.  Blood tests.  Taking a sample of liver tissue to examine under a microscope (liver biopsy). You may also have other tests, including:  X-rays.  Ultrasound. How is this treated? Treatment may include:  Stopping alcohol use to allow the liver to heal.  Joining a support group or meeting with a counselor.  Medicines to reduce inflammation. These may be recommended if the disease is severe.  A liver transplant. This is the only treatment if the disease is very severe.  Nutritional therapy. This may involve:  Taking vitamins.  Eating foods that are high in thiamine, such as whole-wheat cereals, pork, and raw vegetables.  Eating foods that have a lot of folic acid, such as vegetables, fruits, meats, beans, nuts, and dairy foods.  Eating a diet that includes carbohydrate-rich foods, such as yogurt, beans, potatoes, and rice. Follow these instructions at home:  Do not drink alcohol.  Take medicines only as directed by your health care provider.  Take vitamins only as directed by your health care provider.  Follow any diet instructions that are given to you by your health care provider. Contact a health care provider if:  You have a fever.  You have shortness of breath or have difficulty breathing.  You have bright red blood in your stool, or you have black, tarry stools.  You are vomiting blood.  Your skin color becomes  more yellow, pale, or dark.  You develop headaches.  You have trouble thinking.  You have problems balancing or walking. This information is not intended to replace advice given to you by your health care provider. Make sure you discuss any questions you have with your health care provider. Document Released: 10/29/2014 Document Revised: 03/15/2016 Document Reviewed: 09/09/2014 Elsevier Interactive Patient Education  2017 [employer] Abdominal Pain, Adult Many things can cause belly (abdominal) pain. Most times, belly pain is not dangerous. Many cases of belly pain can be watched and treated at home. Sometimes belly pain is serious, though. Your doctor will try to find the cause of your belly pain. Follow these instructions at home:  Take over-the-counter and prescription medicines only as told by your doctor. Do not take medicines that help you poop (laxatives) unless told to by your doctor.  Drink enough fluid to keep your pee (urine) clear or pale yellow.  Watch your belly pain for any changes.  Keep all follow-up visits as told by your doctor. This is important. Contact a doctor if:  Your belly pain changes or gets worse.  You are not hungry, or you lose weight without trying.  You are having  trouble pooping (constipated) or have watery poop (diarrhea) for more than 2-3 days.  You have pain when you pee or poop.  Your belly pain wakes you up at night.  Your pain gets worse with meals, after eating, or with certain foods.  You are throwing up and cannot keep anything down.  You have a fever. Get help right away if:  Your pain does not go away as soon as your doctor says it should.  You cannot stop throwing up.  Your pain is only in areas of your belly, such as the right side or the left lower part of the belly.  You have bloody or black poop, or poop that looks like tar.  You have very bad pain, cramping, or bloating in your belly.  You have signs of not having  enough fluid or water in your body (dehydration), such as:  Dark pee, very little pee, or no pee.  Cracked lips.  Dry mouth.  Sunken eyes.  Sleepiness.  Weakness. This information is not intended to replace advice given to you by your health care provider. Make sure you discuss any questions you have with your health care provider. Document Released: 03/26/2008 Document Revised: 04/27/2016 Document Reviewed: 03/21/2016 Elsevier Interactive Patient Education  2017 [employer]   Ascites Ascites is a collection of excess fluid in the abdomen. Ascites can range from mild to severe. It can get worse without treatment. What are the causes? Possible causes include:  Cirrhosis. This is the most common cause of ascites.  Infection or inflammation in the abdomen.  Cancer in the abdomen.  Heart failure.  Kidney disease.  Inflammation of the pancreas.  Clots in the veins of the liver. What are the signs or symptoms? Signs and symptoms may include:  A feeling of fullness in your abdomen. This is common.  An increase in the size of your abdomen or your waist.  Swelling in your legs.  Swelling of the scrotum in men.  Difficulty breathing.  Abdominal pain.  Sudden weight gain. If the condition is mild, you may not have symptoms. How is this diagnosed? To make a diagnosis, your health care provider will:  Ask about your medical history.  Perform a physical exam.  Order imaging tests, such as an ultrasound or CT scan of your abdomen. How is this treated? Treatment depends on the cause of the ascites. It may include:  Taking a pill to make you urinate. This is called a water pill (diuretic pill).  Strictly reducing your salt (sodium) intake. Salt can cause extra fluid to be kept in the body, and this makes ascites worse.  Having a procedure to remove fluid from your abdomen (paracentesis).  Having a procedure to transfer fluid from your abdomen into a  vein.  Having a procedure that connects two of the major veins within your liver and relieves pressure on your liver (TIPS procedure). Ascites may go away or improve with treatment of the condition that caused it. Follow these instructions at home:  Keep track of your weight. To do this, weigh yourself at the same time every day and record your weight.  Keep track of how much you drink and any changes in the amount you urinate.  Follow any instructions that your health care provider gives you about how much to drink.  Try not to eat salty (high-sodium) foods.  Take medicines only as directed by your health care provider.  Keep all follow-up visits as directed by your health care  provider. This is important.  Report any changes in your health to your health care provider, especially if you develop new symptoms or your symptoms get worse. Contact a health care provider if:  Your gain more than 3 pounds in 3 days.  Your abdominal size or your waist size increases.  You have new swelling in your legs.  The swelling in your legs gets worse. Get help right away if:  You develop a fever.  You develop confusion.  You develop new or worsening difficulty breathing.  You develop new or worsening abdominal pain.  You develop new or worsening swelling in the scrotum (in men). This information is not intended to replace advice given to you by your health care provider. Make sure you discuss any questions you have with your health care provider. Document Released: 10/08/2005 Document Revised: 02/15/2016 Document Reviewed: 05/07/2014 Elsevier Interactive Patient Education  2017 [employer]  Alcoholic Liver Disease The liver is an organ that converts food into energy, absorbs vitamins from food, removes toxins from the blood, and makes proteins. Alcoholic liver disease happens when the liver becomes damaged due to alcohol consumption and it stops working properly. What are the  causes? This condition is caused by drinking too much alcohol over a number of years. What increases the risk? This condition is more likely to develop in:  Women.  People who have a family history of the disease.  People who have poor nutrition.  People who are obese. What are the signs or symptoms? Early symptoms of this condition include:  Fatigue.  Weakness.  Abdominal discomfort on the upper right side. Symptoms of moderate disease include:  Fever.  Yellow, pale, or darkening skin.  Abdominal pain.  Nausea and vomiting.  Weight loss.  Loss of appetite. Symptoms of advanced disease include:  Abdominal swelling.  Nosebleeds or bleeding gums.  Itchy skin.  Enlarged fingertips (clubbing).  Light-colored stools that smell very bad (steatorrhea).  Mood changes.  Feeling agitated.  Confusion.  Trouble concentrating. Some people do not have symptoms until the condition becomes severe. Symptoms are often worse after heavy drinking. How is this diagnosed? This condition is diagnosed with:  A physical exam.  Blood tests.  Taking a sample of liver tissue to examine under a microscope (liver biopsy). You may also have other tests, including:  X-rays.  Ultrasound. How is this treated? Treatment may include:  Stopping alcohol use to allow the liver to heal.  Joining a support group or meeting with a counselor.  Medicines to reduce inflammation. These may be recommended if the disease is severe.  A liver transplant. This is the only treatment if the disease is very severe.  Nutritional therapy. This may involve:  Taking vitamins.  Eating foods that are high in thiamine, such as whole-wheat cereals, pork, and raw vegetables.  Eating foods that have a lot of folic acid, such as vegetables, fruits, meats, beans, nuts, and dairy foods.  Eating a diet that includes carbohydrate-rich foods, such as yogurt, beans, potatoes, and rice. Follow these  instructions at home:  Do not drink alcohol.  Take medicines only as directed by your health care provider.  Take vitamins only as directed by your health care provider.  Follow any diet instructions that are given to you by your health care provider. Contact a health care provider if:  You have a fever.  You have shortness of breath or have difficulty breathing.  You have bright red blood in your stool, or you have  black, tarry stools.  You are vomiting blood.  Your skin color becomes more yellow, pale, or dark.  You develop headaches.  You have trouble thinking.  You have problems balancing or walking. This information is not intended to replace advice given to you by your health care provider. Make sure you discuss any questions you have with your health care provider. Document Released: 10/29/2014 Document Revised: 03/15/2016 Document Reviewed: 09/09/2014 Elsevier Interactive Patient Education  2017 [employer]   Abdominal Pain, Adult Many things can cause belly (abdominal) pain. Most times, belly pain is not dangerous. Many cases of belly pain can be watched and treated at home. Sometimes belly pain is serious, though. Your doctor will try to find the cause of your belly pain. Follow these instructions at home:  Take over-the-counter and prescription medicines only as told by your doctor. Do not take medicines that help you poop (laxatives) unless told to by your doctor.  Drink enough fluid to keep your pee (urine) clear or pale yellow.  Watch your belly pain for any changes.  Keep all follow-up visits as told by your doctor. This is important. Contact a doctor if:  Your belly pain changes or gets worse.  You are not hungry, or you lose weight without trying.  You are having trouble pooping (constipated) or have watery poop (diarrhea) for more than 2-3 days.  You have pain when you pee or poop.  Your belly pain wakes you up at night.  Your pain gets worse  with meals, after eating, or with certain foods.  You are throwing up and cannot keep anything down.  You have a fever. Get help right away if:  Your pain does not go away as soon as your doctor says it should.  You cannot stop throwing up.  Your pain is only in areas of your belly, such as the right side or the left lower part of the belly.  You have bloody or black poop, or poop that looks like tar.  You have very bad pain, cramping, or bloating in your belly.  You have signs of not having enough fluid or water in your body (dehydration), such as:  Dark pee, very little pee, or no pee.  Cracked lips.  Dry mouth.  Sunken eyes.  Sleepiness.  Weakness. This information is not intended to replace advice given to you by your health care provider. Make sure you discuss any questions you have with your health care provider. Document Released: 03/26/2008 Document Revised: 04/27/2016 Document Reviewed: 03/21/2016 Elsevier Interactive Patient Education  2017 [employer]   Ascites Ascites is a collection of excess fluid in the abdomen. Ascites can range from mild to severe. It can get worse without treatment. What are the causes? Possible causes include:  Cirrhosis. This is the most common cause of ascites.  Infection or inflammation in the abdomen.  Cancer in the abdomen.  Heart failure.  Kidney disease.  Inflammation of the pancreas.  Clots in the veins of the liver. What are the signs or symptoms? Signs and symptoms may include:  A feeling of fullness in your abdomen. This is common.  An increase in the size of your abdomen or your waist.  Swelling in your legs.  Swelling of the scrotum in men.  Difficulty breathing.  Abdominal pain.  Sudden weight gain. If the condition is mild, you may not have symptoms. How is this diagnosed? To make a diagnosis, your health care provider will:  Ask about your medical history.  Perform a physical  exam.  Order imaging tests, such as an ultrasound or CT scan of your abdomen. How is this treated? Treatment depends on the cause of the ascites. It may include:  Taking a pill to make you urinate. This is called a water pill (diuretic pill).  Strictly reducing your salt (sodium) intake. Salt can cause extra fluid to be kept in the body, and this makes ascites worse.  Having a procedure to remove fluid from your abdomen (paracentesis).  Having a procedure to transfer fluid from your abdomen into a vein.  Having a procedure that connects two of the major veins within your liver and relieves pressure on your liver (TIPS procedure). Ascites may go away or improve with treatment of the condition that caused it. Follow these instructions at home:  Keep track of your weight. To do this, weigh yourself at the same time every day and record your weight.  Keep track of how much you drink and any changes in the amount you urinate.  Follow any instructions that your health care provider gives you about how much to drink.  Try not to eat salty (high-sodium) foods.  Take medicines only as directed by your health care provider.  Keep all follow-up visits as directed by your health care provider. This is important.  Report any changes in your health to your health care provider, especially if you develop new symptoms or your symptoms get worse. Contact a health care provider if:  Your gain more than 3 pounds in 3 days.  Your abdominal size or your waist size increases.  You have new swelling in your legs.  The swelling in your legs gets worse. Get help right away if:  You develop a fever.  You develop confusion.  You develop new or worsening difficulty breathing.  You develop new or worsening abdominal pain.  You develop new or worsening swelling in the scrotum (in men). This information is not intended to replace advice given to you by your health care provider. Make sure you  discuss any questions you have with your health care provider. Document Released: 10/08/2005 Document Revised: 02/15/2016 Document Reviewed: 05/07/2014 [employer] Interactive Patient Education  2017 [employer].

## 2016-12-18 NOTE — Discharge Summary (Signed)
[facility] [place] at [facility]   PATIENT NAME: Antonio Ryan    MR#:  [id number]  DATE OF BIRTH:  [birth year]  DATE OF ADMISSION:  12/14/2016 ADMITTING PHYSICIAN: Adrian Saran, MD  DATE OF DISCHARGE: 12/18/2016  PRIMARY CARE PHYSICIAN: Pcp Not In System    ADMISSION DIAGNOSIS:  Generalized abdominal pain [R10.84] Ascites due to alcoholic cirrhosis (HCC) [K70.31] Hematemesis, presence of nausea not specified [K92.0]  DISCHARGE DIAGNOSIS:  Active Problems:   Ascites   Esophageal varices without bleeding (HCC)   SECONDARY DIAGNOSIS:   Past Medical History:  Diagnosis Date  . Acid reflux   . Cirrhosis (HCC)   . Hepatitis C   . Hypertension   . Neuropathy (HCC)   . Spinal stenosis     HOSPITAL COURSE:   61 year old male with past medical history of alcoholic liver cirrhosis, history of spinal stenosis, hypertension, hepatitis C, GERD who presents to the hospital due to abdominal pain.  1. Abdominal pain-secondary to ascites. Patient is status post large volume paracentesis x 2 while in the hospital on 2/23 and 2/26.  He had total of 15 L of fluid removed.  -Fluid analysis was negative for SBP. He has hx of SBP will be discharged on Daily Bactrim DS for prophylaxis.    -Patient was also discharged on oral Lasix and Aldactone.  2. Suspected upper GI bleed-patient presented with some coffee-ground emesis/hematemesis. He did not have any further bleeding while he was in the hospital. Given his chronic liver disease patient underwent a upper GI endoscopy done by gastroenterology which showed grade 1 esophageal varices with portal hypertensive gastropathy but no evidence of acute bleeding. -His hemoglobin has remained stable and he will be discharged on PPI twice a day along with Nadolol  - follow up with GI as outpatient in 2 weeks.   3. History of alcohol abuse- pt. Had no evidence of ETOH [place] but was maintained on CIWA while in hospital.   4.  History of hepatic encephalopathy- he will continue lactulose.  5. Essential HTN - his BP was borderline on low side and he was taken of lisinopril.  He will cont. Nadolol.   6. Anemia/Thrombocytopenia - due to chronic Liver disease.  - his counts remained stable and he did not required any transfusions while in the hospital.   DISCHARGE CONDITIONS:   Stable.  CONSULTS OBTAINED:  Treatment Team:  Toney Reil, MD  DRUG ALLERGIES:  No Known Allergies  DISCHARGE MEDICATIONS:   Allergies as of 12/18/2016   No Known Allergies     Medication List    STOP taking these medications   ciprofloxacin 500 MG tablet Commonly known as:  CIPRO   HYDROmorphone 2 MG tablet Commonly known as:  DILAUDID   ibuprofen 200 MG tablet Commonly known as:  ADVIL,MOTRIN   lisinopril 20 MG tablet Commonly known as:  PRINIVIL,ZESTRIL   omeprazole 20 MG capsule Commonly known as:  PRILOSEC   thiamine 100 MG tablet     TAKE these medications   folic acid 1 MG tablet Commonly known as:  FOLVITE Take 1 tablet (1 mg total) by mouth daily.   furosemide 40 MG tablet Commonly known as:  LASIX Take 1 tablet (40 mg total) by mouth 2 (two) times daily.   lactulose 10 GM/15ML solution Commonly known as:  CHRONULAC Take 67.5 mLs (45 g total) by mouth 2 (two) times daily. What changed:  how much to take   nadolol 20 MG tablet Commonly known  as:  CORGARD Take 1 tablet (20 mg total) by mouth daily.   pantoprazole 40 MG tablet Commonly known as:  PROTONIX Take 1 tablet (40 mg total) by mouth 2 (two) times daily before a meal.   spironolactone 100 MG tablet Commonly known as:  ALDACTONE Take 1 tablet (100 mg total) by mouth daily.   sulfamethoxazole-trimethoprim 800-160 MG tablet Commonly known as:  BACTRIM DS,SEPTRA DS Take 1 tablet by mouth daily.         DISCHARGE INSTRUCTIONS:   DIET:  Cardiac diet  DISCHARGE CONDITION:  Stable  ACTIVITY:  Activity as  tolerated  OXYGEN:  Home Oxygen: No.   Oxygen Delivery: room air  DISCHARGE LOCATION:  home   If you experience worsening of your admission symptoms, develop shortness of breath, life threatening emergency, suicidal or homicidal thoughts you must seek medical attention immediately by calling 911 or calling your MD immediately  if symptoms less severe.  You Must read complete instructions/literature along with all the possible adverse reactions/side effects for all the Medicines you take and that have been prescribed to you. Take any new Medicines after you have completely understood and accpet all the possible adverse reactions/side effects.   Please note  You were cared for by a hospitalist during your hospital stay. If you have any questions about your discharge medications or the care you received while you were in the hospital after you are discharged, you can call the unit and asked to speak with the hospitalist on call if the hospitalist that took care of you is not available. Once you are discharged, your primary care physician will handle any further medical issues. Please note that NO REFILLS for any discharge medications will be authorized once you are discharged, as it is imperative that you return to your primary care physician (or establish a relationship with a primary care physician if you do not have one) for your aftercare needs so that they can reassess your need for medications and monitor your lab values.     Today   S/p US guided Paracentesis w/ 4.5 L removed yesterday. NO fever, abdominal pain, N/V.  D/c home today.   VITAL SIGNS:  Blood pressure 99/62, pulse 87, temperature 98.2 F (36.8 C), temperature source Oral, resp. rate 20, height 6\' 1"  (1.854 m), weight 93.7 kg (206 lb 9 oz), SpO2 94 %.  I/O:   Intake/Output Summary (Last 24 hours) at 12/18/16 1355 Last data filed at 12/18/16 1000  Gross per 24 hour  Intake              340 ml  Output                0  ml  Net              340 ml    PHYSICAL EXAMINATION:   GENERAL:  61 y.o.-year-old patient lying in the bed in no acute distress.  EYES: Pupils equal, round, reactive to light and accommodation. No scleral icterus. Extraocular muscles intact.  HEENT: Head atraumatic, normocephalic. Oropharynx and nasopharynx clear.  NECK:  Supple, no jugular venous distention. No thyroid enlargement, no tenderness.  LUNGS: Normal breath sounds bilaterally, no wheezing, rales, rhonchi. No use of accessory muscles of respiration.  CARDIOVASCULAR: S1, S2 normal. No murmurs, rubs, or gallops.  ABDOMEN: Soft, Nontender, distended, + fluid wave due to ascites Bowel sounds present. No organomegaly or mass.  EXTREMITIES: No cyanosis, clubbing or edema b/l.    NEUROLOGIC: Cranial  nerves II through XII are intact. No focal Motor or sensory deficits b/l.   PSYCHIATRIC: The patient is alert and oriented x 3.  SKIN: No obvious rash, lesion, or ulcer.    DATA REVIEW:   CBC  Recent Labs Lab 12/16/16 0643  WBC 4.0  HGB 14.3  HCT 41.0  PLT 69*    Chemistries   Recent Labs Lab 12/14/16 1145  12/18/16 0437  NA 138  < > 129*  K 3.5  < > 3.4*  CL 101  < > 96*  CO2 28  < > 28  GLUCOSE 116*  < > 98  BUN 7  < > 12  CREATININE 0.63  < > 0.79  CALCIUM 8.2*  < > 7.9*  MG  --   --  1.7  AST 128*  --   --   ALT 43  --   --   ALKPHOS 66  --   --   BILITOT 2.2*  --   --   < > = values in this interval not displayed.  Cardiac Enzymes No results for input(s): TROPONINI in the last 168 hours.  Microbiology Results  Results for orders placed or performed during the hospital encounter of 12/14/16  Blood culture (routine x 2)     Status: None (Preliminary result)   Collection Time: 12/14/16 11:45 AM  Result Value Ref Range Status   Specimen Description BLOOD RIGHT ASSIST CONTROL  Final   Special Requests   Final    BOTTLES DRAWN AEROBIC AND ANAEROBIC ANA11ML AER10ML   Culture NO GROWTH 4 DAYS  Final    Report Status PENDING  Incomplete  Blood culture (routine x 2)     Status: None (Preliminary result)   Collection Time: 12/14/16 11:45 AM  Result Value Ref Range Status   Specimen Description BLOOD LEFT ASSIST CONTROL  Final   Special Requests   Final    BOTTLES DRAWN AEROBIC AND ANAEROBIC ANA11ML AER11ML   Culture NO GROWTH 4 DAYS  Final   Report Status PENDING  Incomplete  Body fluid culture     Status: None   Collection Time: 12/14/16 12:28 PM  Result Value Ref Range Status   Specimen Description PARACENTESIS  Final   Special Requests NONE  Final   Gram Stain   Final    FEW WBC PRESENT, PREDOMINANTLY PMN NO ORGANISMS SEEN    Culture   Final    NO GROWTH 3 DAYS Performed at [facility], [address] [place], [place] [address]    Report Status 12/17/2016 FINAL  Final    RADIOLOGY:  [place] Paracentesis  Result Date: 12/18/2016 INDICATION: Cirrhosis and ascites. EXAM: ULTRASOUND GUIDED PARACENTESIS MEDICATIONS: None. COMPLICATIONS: None immediate. PROCEDURE: Informed written consent was obtained from the patient after a discussion of the risks, benefits and alternatives to treatment. A timeout was performed prior to the initiation of the procedure. Initial ultrasound was performed to localize ascites. The left lower abdomen was prepped and draped in the usual sterile fashion. 1% lidocaine was used for local anesthesia. Following this, a 6 Fr Safe-T-Centesis catheter was introduced. An ultrasound image was saved for documentation purposes. The paracentesis was performed. The catheter was removed and a dressing was applied. The patient tolerated the procedure well without immediate post procedural complication. FINDINGS: A total of approximately 4.5 L of yellow fluid was removed. IMPRESSION: Successful ultrasound-guided paracentesis yielding 4.5 liters of peritoneal fluid. Electronically Signed   By: Rudene AndaGlenn  Yamagata M.D.  On: 12/18/2016 11:28      Management plans discussed with  the patient, family and they are in agreement.  CODE STATUS:     Code Status Orders        Start     Ordered   12/14/16 1608  Full code  Continuous     12/14/16 1607    Code Status History    Date Active Date Inactive Code Status Order ID Comments User Context   09/27/2016  2:51 AM 10/03/2016 11:49 PM Full Code [id number]  Ihor Austin, MD Inpatient   09/27/2016  2:51 AM 09/27/2016  2:51 AM Full Code [id number]  Tonye Royalty, DO Inpatient      TOTAL TIME TAKING CARE OF THIS PATIENT: 40 minutes.    Houston Siren M.D on 12/18/2016 at 1:55 PM  Between 7am to 6pm - Pager - [phone]  After 6pm go to [email or web address] - Social research officer, government  [facility]  Office  [phone]  CC: Primary care physician; Pcp Not In System

## 2016-12-18 NOTE — Care Management (Signed)
Paracentesis done 12/17/16. This procedure yielded 4.5 liters fluid.  Discharge to home today per Dr. Cherlynn KaiserSainani.  Information about Medication Management given to Mr. Antonio Ryan. Prescription faxed to Medication Management, Discussed if Medication Management couldn't help with some of the medications, [facility]. Friend will transport. Gwenette GreetBrenda S Nattalie Santiesteban RN MSN CCM Care Management

## 2016-12-18 NOTE — Progress Notes (Signed)
Physical Therapy Treatment Patient Details Name: Antonio Ryan MRN: [id number] DOB: 10/02/1956 Today's Date: 12/18/2016    History of Present Illness Antonio Ryan  is a 61 y.o. male with a known history of Hepatitis C and EtOH related liver cirrhosis, SPEP and essential hypertension who presents with complaints of increasing abdominal girth which results in abdominal pain over the last several weeks. He ran out of medications a few weeks ago. He denies fever but has had some chills and nausea. He denies coffee-ground emesis or melena. He does report that yesterday he had episode of vomiting food with pink tinged blood. He continues to drink bottles of wine almost on a daily basis. He has not undergone EGD or colonoscopy. In the emergency room due to concern of SBP he had paracentesis and was started on ceftriaxone. He was also started octreotide and Protonix drip due to possible upper GI bleed. Again patient reports some pink tinge vomit yesterday and this morning, however denies bright red blood per rectum, melena or frank hematemesis.    PT Comments    Pt in bed initially refusing session "I want to go home".  Encouraged participation and he agreed to ambulation.  Bed mobility without assist.  Pt sat EOB for an extended period of time due to abdominal and back pain.  He was able to ambulate 120' with overall poor gait quality.  Pt wanted to use SPC vs walker today.  Pt holding railings or writers hand when they were not available.  Generally unsteady and high fall risk.  Discussed assistive devices at home and encouraged RW upon discharge for safety if he does not accept SNF recommendations.  Pt stated he has both at home and attributes deficits to neuropathy in LLE.  Declined further session.   Follow Up Recommendations  SNF;Other (comment)     Equipment Recommendations       Recommendations for Other Services       Precautions / Restrictions Precautions Precautions:  Fall Restrictions Weight Bearing Restrictions: No    Mobility  Bed Mobility Overal bed mobility: Modified Independent                Transfers Overall transfer level: Needs assistance Equipment used: Straight cane Transfers: Sit to/from Stand Sit to Stand: Min guard         General transfer comment: increased time due to pain  Ambulation/Gait     Assistive device: Straight cane;1 person hand held assist Gait Pattern/deviations: Step-through pattern;Decreased step length - right;Decreased step length - left Gait velocity: Decreaed Gait velocity interpretation: <1.8 ft/sec, indicative of risk for recurrent falls General Gait Details: wide BOS, unsteady.  no c/o dizziness today.     Stairs            Wheelchair Mobility    Modified Rankin (Stroke Patients Only)       Balance Overall balance assessment: History of Falls                                  Cognition Arousal/Alertness: Awake/alert Behavior During Therapy: WFL for tasks assessed/performed Overall Cognitive Status: Within Functional Limits for tasks assessed                      Exercises      General Comments        Pertinent Vitals/Pain Pain Assessment: 0-10 Pain Score: 7  Pain Location: lower abdomen and back  Pain Descriptors / Indicators: Sharp Pain Intervention(s): Limited activity within patient's tolerance;Monitored during session    Home Living                      Prior Function            PT Goals (current goals can now be found in the care plan section)      Frequency    Min 2X/week      PT Plan      Co-evaluation             End of Session Equipment Utilized During Treatment: Gait belt Activity Tolerance: Patient limited by pain Patient left: in bed;with call bell/phone within reach;with bed alarm set         Time: 1050-1100 PT Time Calculation (min) (ACUTE ONLY): 10 min  Charges:  $Gait Training: 8-22  mins                    G Codes:       Danielle Dess 01/10/17, 11:35 AM

## 2016-12-19 LAB — CULTURE, BLOOD (ROUTINE X 2)
Culture: NO GROWTH
Culture: NO GROWTH

## 2016-12-19 LAB — HEPATITIS C GENOTYPE

## 2016-12-31 ENCOUNTER — Telehealth: Payer: Self-pay | Admitting: Gastroenterology

## 2016-12-31 NOTE — Telephone Encounter (Signed)
Patient needs to have his abdomin drained. He would like to know if you could make him an appointment at the hospital to have that done. Please call

## 2017-01-01 ENCOUNTER — Telehealth: Payer: Self-pay

## 2017-01-01 ENCOUNTER — Other Ambulatory Visit: Payer: Self-pay

## 2017-01-01 DIAGNOSIS — K7460 Unspecified cirrhosis of liver: Secondary | ICD-10-CM

## 2017-01-01 DIAGNOSIS — R188 Other ascites: Principal | ICD-10-CM

## 2017-01-01 NOTE — Telephone Encounter (Signed)
Advised pt that Dr. Tobi BastosAnna would sign orders for paracentesis with a scheduled appointment.   Faxed documentation to [facility].   Advised callback with dates and times of service.

## 2017-01-01 NOTE — Telephone Encounter (Signed)
Yes he can have it done, please place order, he must have a follow up after otherwise the next time he requires it, I will not be putting in the orders.

## 2017-01-03 ENCOUNTER — Encounter: Payer: Self-pay | Admitting: Emergency Medicine

## 2017-01-03 ENCOUNTER — Other Ambulatory Visit: Payer: Self-pay | Admitting: Internal Medicine

## 2017-01-03 ENCOUNTER — Emergency Department
Admission: EM | Admit: 2017-01-03 | Discharge: 2017-01-03 | Disposition: A | Discharge status: Home / Self Care | Payer: Medicaid Other | Attending: Emergency Medicine | Admitting: Emergency Medicine

## 2017-01-03 ENCOUNTER — Other Ambulatory Visit: Payer: Self-pay

## 2017-01-03 DIAGNOSIS — R188 Other ascites: Principal | ICD-10-CM

## 2017-01-03 DIAGNOSIS — Z9114 Patient's other noncompliance with medication regimen: Secondary | ICD-10-CM | POA: Diagnosis not present

## 2017-01-03 DIAGNOSIS — Z87891 Personal history of nicotine dependence: Secondary | ICD-10-CM | POA: Diagnosis not present

## 2017-01-03 DIAGNOSIS — K7460 Unspecified cirrhosis of liver: Secondary | ICD-10-CM

## 2017-01-03 DIAGNOSIS — R1084 Generalized abdominal pain: Secondary | ICD-10-CM | POA: Diagnosis present

## 2017-01-03 DIAGNOSIS — K7031 Alcoholic cirrhosis of liver with ascites: Secondary | ICD-10-CM

## 2017-01-03 DIAGNOSIS — I10 Essential (primary) hypertension: Secondary | ICD-10-CM | POA: Diagnosis not present

## 2017-01-03 LAB — URINALYSIS, COMPLETE (UACMP) WITH MICROSCOPIC
BILIRUBIN URINE: NEGATIVE
Bacteria, UA: NONE SEEN
GLUCOSE, UA: NEGATIVE mg/dL
HGB URINE DIPSTICK: NEGATIVE
Ketones, ur: NEGATIVE mg/dL
LEUKOCYTES UA: NEGATIVE
NITRITE: NEGATIVE
PH: 6 (ref 5.0–8.0)
Protein, ur: NEGATIVE mg/dL
RBC / HPF: NONE SEEN RBC/hpf (ref 0–5)
SPECIFIC GRAVITY, URINE: 1.014 (ref 1.005–1.030)
WBC, UA: NONE SEEN WBC/hpf (ref 0–5)

## 2017-01-03 LAB — COMPREHENSIVE METABOLIC PANEL
ALK PHOS: 65 U/L (ref 38–126)
ALT: 61 U/L (ref 17–63)
ANION GAP: 7 (ref 5–15)
AST: 125 U/L — ABNORMAL HIGH (ref 15–41)
Albumin: 2.8 g/dL — ABNORMAL LOW (ref 3.5–5.0)
BILIRUBIN TOTAL: 1.4 mg/dL — AB (ref 0.3–1.2)
BUN: 8 mg/dL (ref 6–20)
CALCIUM: 7.8 mg/dL — AB (ref 8.9–10.3)
CO2: 23 mmol/L (ref 22–32)
Chloride: 105 mmol/L (ref 101–111)
Creatinine, Ser: 0.54 mg/dL — ABNORMAL LOW (ref 0.61–1.24)
GFR calc non Af Amer: >60 mL/min (ref >60)
Glucose, Bld: 110 mg/dL — ABNORMAL HIGH (ref 65–99)
Potassium: 5 mmol/L (ref 3.5–5.1)
Sodium: 135 mmol/L (ref 135–145)
TOTAL PROTEIN: 7 g/dL (ref 6.5–8.1)

## 2017-01-03 LAB — CBC
HEMATOCRIT: 39.4 % — AB (ref 40.0–52.0)
Hemoglobin: 13.4 g/dL (ref 13.0–18.0)
MCH: 34.8 pg — ABNORMAL HIGH (ref 26.0–34.0)
MCHC: 34 g/dL (ref 32.0–36.0)
MCV: 102.1 fL — ABNORMAL HIGH (ref 80.0–100.0)
PLATELETS: 118 10*3/uL — AB (ref 150–440)
RBC: 3.86 MIL/uL — ABNORMAL LOW (ref 4.40–5.90)
RDW: 14.9 % — AB (ref 11.5–14.5)
WBC: 2.6 10*3/uL — ABNORMAL LOW (ref 3.8–10.6)

## 2017-01-03 LAB — LIPASE, BLOOD: Lipase: 41 U/L (ref 11–51)

## 2017-01-03 LAB — PROTIME-INR
INR: 1.2
Prothrombin Time: 15.3 seconds — ABNORMAL HIGH (ref 11.4–15.2)

## 2017-01-03 LAB — APTT: aPTT: 35 seconds (ref 24–36)

## 2017-01-03 NOTE — Discharge Instructions (Signed)
Go to the medication management clinic for your medications.  Take all of your medicines as prescribed and avoid running out. Follow up with a primary care doctor for refills of your medicines and to have routine paracentesis arranged.

## 2017-01-03 NOTE — ED Triage Notes (Signed)
Pt via ems from home with ascites worsening since Monday. Pt states he has hx of hepatitis c and cirrhosis of the liver. He reports approximately monthly paracentesis. He states his provider has had difficulty getting an appointment at hospital for procedure. Pt reports generalized abdominal pain 10/10. He has obvious distention in abdomen. Pt alert & oriented; NAD noted.

## 2017-01-03 NOTE — ED Provider Notes (Signed)
[facility] Emergency Department Provider Note  ____________________________________________  Time seen: Approximately 9:56 AM  I have reviewed the triage vital signs and the nursing notes.   HISTORY  Chief Complaint Abdominal Pain    HPI Antonio Ryan is a 61 y.o. male who complains of an enlarged abdomen due to ascites accumulation. He reports a history of liver failure. He is been seen by GI in the past, but been noncompliant with medications and follow-up for routine paracentesis. In the emergency department about a month ago, and eloped before a therapeutic paracentesis could be performed that dayby interventional radiology. He'll return to the emergency department one week later with suspected SBP, which was managed in the hospital. He had an EGD done which showed portal hypertensive gastropathy and grade 1 varices. He was started on nadolol for management of this. Patient reports he ran out of all his medications about a month ago. [name]  He was called by the medication management clinic to inform him that they had all his prescriptions ready for him to pick up, but he reports being unsure of where the medication management clinic is.  Patient denies any rectal bleeding or melanotic stool. No vomiting or hematemesis. No shortness of breath fever or chills. Reports some generalized abdominal discomfort from the distention but denies sharp or severe pain.  Past Medical History:  Diagnosis Date  . Acid reflux   . Cirrhosis (HCC)   . Hepatitis C   . Hypertension   . Neuropathy (HCC)   . Spinal stenosis      Patient Active Problem List   Diagnosis Date Noted  . Esophageal varices without bleeding (HCC)   . Ascites 12/14/2016  . SBP (spontaneous bacterial peritonitis) (HCC)   . Ascites due to alcoholic cirrhosis (HCC)   . Generalized abdominal pain   . Hematemesis 09/27/2016  . GI bleed 09/27/2016     Past Surgical History:  Procedure Laterality  Date  . BACK SURGERY    . ESOPHAGOGASTRODUODENOSCOPY (EGD) WITH PROPOFOL N/A 12/17/2016   Procedure: ESOPHAGOGASTRODUODENOSCOPY (EGD) WITH PROPOFOL;  Surgeon: Wyline Mood, MD;  Location: [facility] ENDOSCOPY;  Service: Gastroenterology;  Laterality: N/A;  . PARACENTESIS       Prior to Admission medications   Medication Sig Start Date End Date Taking? Authorizing Provider  folic acid (FOLVITE) 1 MG tablet Take 1 tablet (1 mg total) by mouth daily. Patient not taking: Reported on 01/03/2017 12/05/16   Sharman Cheek, MD  furosemide (LASIX) 40 MG tablet Take 1 tablet (40 mg total) by mouth 2 (two) times daily. 12/18/16   Houston Siren, MD  lactulose (CHRONULAC) 10 GM/15ML solution Take 67.5 mLs (45 g total) by mouth 2 (two) times daily. 12/18/16   Houston Siren, MD  nadolol (CORGARD) 20 MG tablet Take 1 tablet (20 mg total) by mouth daily. 12/18/16   Houston Siren, MD  pantoprazole (PROTONIX) 40 MG tablet Take 1 tablet (40 mg total) by mouth 2 (two) times daily before a meal. 12/18/16   Houston Siren, MD  spironolactone (ALDACTONE) 100 MG tablet Take 1 tablet (100 mg total) by mouth daily. 12/18/16   Houston Siren, MD  sulfamethoxazole-trimethoprim (BACTRIM DS,SEPTRA DS) 800-160 MG tablet Take 1 tablet by mouth daily. Patient not taking: Reported on 01/03/2017 12/18/16   Houston Siren, MD     Allergies Patient has no known allergies.   Family History  Problem Relation Age of Onset  . Hypertension Mother   . Arthritis-Osteo Mother  Social History Social History  Substance Use Topics  . Smoking status: Former Games developermoker  . Smokeless tobacco: Never Used  . Alcohol use 2.4 oz/week    4 Glasses of wine per week    Review of Systems  Constitutional:   No fever or chills.  ENT:   No sore throat. No rhinorrhea. Cardiovascular:   No chest pain. Respiratory:   No dyspnea or cough. Gastrointestinal:   Positive abdominal pain and distention without vomiting and diarrhea.  Genitourinary:    Negative for dysuria or difficulty urinating. Musculoskeletal:   Negative for focal pain or swelling Neurological:   Negative for headaches 10-point ROS otherwise negative.  ____________________________________________   PHYSICAL EXAM:  VITAL SIGNS: ED Triage Vitals  Enc Vitals Group     BP 01/03/17 0909 132/84     Pulse Rate 01/03/17 0909 74     Resp --      Temp 01/03/17 0909 97.8 F (36.6 C)     Temp Source 01/03/17 0909 Oral     SpO2 01/03/17 0909 97 %     Weight 01/03/17 0910 206 lb (93.4 kg)     Height 01/03/17 0910 6\' 1"  (1.854 m)     Head Circumference --      Peak Flow --      Pain Score 01/03/17 0910 10     Pain Loc --      Pain Edu? --      Excl. in GC? --     Vital signs reviewed, nursing assessments reviewed.   Constitutional:   Alert and oriented. Well appearing and in no distress. Eyes:   No scleral icterus. No conjunctival pallor. PERRL. EOMI.  No nystagmus. ENT   Head:   Normocephalic and atraumatic.   Nose:   No congestion/rhinnorhea. No septal hematoma   Mouth/Throat:   MMM, no pharyngeal erythema. No peritonsillar mass.    Neck:   No stridor. No SubQ emphysema. No meningismus. Hematological/Lymphatic/Immunilogical:   No cervical lymphadenopathy. Cardiovascular:   RRR. Symmetric bilateral radial and DP pulses.  No murmurs.  Respiratory:   Normal respiratory effort without tachypnea nor retractions. Breath sounds are clear and equal bilaterally. No wheezes/rales/rhonchi. Gastrointestinal:   Soft and nontender. Moderately distended with ascites. There is no CVA tenderness.  No rebound, rigidity, or guarding. Genitourinary:   deferred Musculoskeletal:   Normal range of motion in all extremities. No joint effusions.  No lower extremity tenderness.  No edema. Neurologic:   Normal speech and language.  CN 2-10 normal. Motor grossly intact. No gross focal neurologic deficits are appreciated.  Skin:    Skin is warm, dry and intact. No rash  noted.  No petechiae, purpura, or bullae.  ____________________________________________    LABS (pertinent positives/negatives) (all labs ordered are listed, but only abnormal results are displayed) Labs Reviewed  COMPREHENSIVE METABOLIC PANEL - Abnormal; Notable for the following:       Result Value   Glucose, Bld 110 (*)    Creatinine, Ser 0.54 (*)    Calcium 7.8 (*)    Albumin 2.8 (*)    AST 125 (*)    Total Bilirubin 1.4 (*)    All other components within normal limits  CBC - Abnormal; Notable for the following:    WBC 2.6 (*)    RBC 3.86 (*)    HCT 39.4 (*)    MCV 102.1 (*)    MCH 34.8 (*)    RDW 14.9 (*)    Platelets 118 (*)  All other components within normal limits  PROTIME-INR - Abnormal; Notable for the following:    Prothrombin Time 15.3 (*)    All other components within normal limits  LIPASE, BLOOD  APTT  URINALYSIS, COMPLETE (UACMP) WITH MICROSCOPIC   ____________________________________________   EKG  Interpreted by me Sinus rhythm rate of 77, normal axis intervals QRS ST segments and T waves. No evidence of pericardial effusion by EKG  ____________________________________________    RADIOLOGY  No results found.  ____________________________________________   PROCEDURES Procedures  ____________________________________________   INITIAL IMPRESSION / ASSESSMENT AND PLAN / ED COURSE  Pertinent labs & imaging results that were available during my care of the patient were reviewed by me and considered in my medical decision making (see chart for details).  Patient presents with abdominal distention due to ascites. He comes to the emergency department to request a routine paracentesis because he reports following up in GI clinic and keeping appointments is too inconvenient. I discussed with him where he can find the medication management clinic to continue all his medicines. We'll check his labs today, otherwise patient will be strongly  encouraged to follow up with gastroenterology for continued management and to resume all medicines.Considering the patient's symptoms, medical history, and physical examination today, I have low suspicion for cholecystitis or biliary pathology, pancreatitis, perforation or bowel obstruction, hernia, intra-abdominal abscess, AAA or dissection, volvulus or intussusception, mesenteric ischemia, or appendicitis. No evidence of GI bleeding. No evidence of spontaneous bacterial peritonitis.    ----------------------------------------- 11:02 AM on 01/03/2017 -----------------------------------------  Labs unremarkable. We'll instruct patient to go to medication management clinic to resume medicines. [facility] or [facility] or other primary care for arrangement of routine outpatient paracentesis and medication refills.         ____________________________________________   FINAL CLINICAL IMPRESSION(S) / ED DIAGNOSES  Final diagnoses:  Alcoholic cirrhosis of liver with ascites (HCC)  Noncompliance with medications      New Prescriptions   No medications on file     Portions of this note were generated with dragon dictation software. Dictation errors may occur despite best attempts at proofreading.    Sharman Cheek, MD 01/03/17 1102

## 2017-01-04 ENCOUNTER — Ambulatory Visit
Admission: RE | Admit: 2017-01-04 | Discharge: 2017-01-04 | Disposition: A | Discharge status: Home / Self Care | Payer: Medicaid Other | Source: Ambulatory Visit | Attending: Gastroenterology | Admitting: Gastroenterology

## 2017-01-04 ENCOUNTER — Other Ambulatory Visit: Payer: Self-pay

## 2017-01-04 DIAGNOSIS — K7460 Unspecified cirrhosis of liver: Secondary | ICD-10-CM | POA: Insufficient documentation

## 2017-01-04 DIAGNOSIS — R188 Other ascites: Secondary | ICD-10-CM | POA: Diagnosis present

## 2017-01-04 LAB — URINE CULTURE: Culture: NO GROWTH

## 2017-01-04 MED ORDER — ALBUMIN HUMAN 25 % IV SOLN: 50.0000 g | Freq: Once | INTRAVENOUS | Status: DC

## 2017-01-04 MED ORDER — ALBUMIN HUMAN 25 % IV SOLN
25.0000 g | Freq: Once | INTRAVENOUS | Status: AC
  Administered 2017-01-04: 25 g via INTRAVENOUS
  Filled 2017-01-04: qty 100

## 2017-01-15 ENCOUNTER — Other Ambulatory Visit: Payer: Self-pay

## 2017-01-15 ENCOUNTER — Telehealth: Payer: Self-pay | Admitting: Gastroenterology

## 2017-01-15 ENCOUNTER — Ambulatory Visit (INDEPENDENT_AMBULATORY_CARE_PROVIDER_SITE_OTHER): Payer: Self-pay | Admitting: Gastroenterology

## 2017-01-15 ENCOUNTER — Encounter (INDEPENDENT_AMBULATORY_CARE_PROVIDER_SITE_OTHER): Payer: Self-pay

## 2017-01-15 ENCOUNTER — Encounter: Payer: Self-pay | Admitting: Gastroenterology

## 2017-01-15 VITALS — BP 118/72 | HR 85 | Ht 73.0 in | Wt 206.2 lb

## 2017-01-15 DIAGNOSIS — I8510 Secondary esophageal varices without bleeding: Secondary | ICD-10-CM

## 2017-01-15 DIAGNOSIS — K7031 Alcoholic cirrhosis of liver with ascites: Secondary | ICD-10-CM

## 2017-01-15 MED ORDER — SPIRONOLACTONE 100 MG PO TABS: 100.0000 mg | ORAL_TABLET | Freq: Two times a day (BID) | ORAL | 1 refills | Status: DC

## 2017-01-15 MED ORDER — NADOLOL 20 MG PO TABS: 20.0000 mg | ORAL_TABLET | Freq: Every day | ORAL | 1 refills | Status: DC

## 2017-01-15 NOTE — Telephone Encounter (Signed)
Lmom to call our office. Dr. Tobi BastosAnna wants to see him in a month.

## 2017-01-15 NOTE — Progress Notes (Signed)
Primary Care Physician: Pcp Not In System  Primary Gastroenterologist:  Dr. Wyline MoodKiran Aftyn Nott   Chief Complaint  Patient presents with  . Hospitalization Follow-up    Rm 5    HPI: Antonio Ryan is a 61 y.o. male is here today to see me for a hospital follow up.   He was admitted on 12/14/16 and GI was consulted/ He has a history of hepatitic C/alcoholic cirrhosis , ascites requiring large volume paracentesis. Recent admissions in 10/17, 12/17, treated for possible SBP . He was admitted for large volume paracentesis.   I performed an EGD on 12/17/16 and found he had small esophageal varices that were not bleeding in addition to PHG. Discharged him on lasix. He subsequently had a repeat paracentesis with 10 litres taken out after discharge.  CT abdomen 09/2016-no hepatoma   Interval history 11/2016-12/2016  Still drinking over the weekend. Advised to stop right away. Never been treated for hepatic C .   Consumes chicken noodle soup, corn chowder. Advised to cook his own food with low salt.   On furosemide- cant recall the dose says takes 2 tablets a day but may be 40 mg   Aldactone 100 mg a day   Nadolol- says he is not taking , he says "they didn't give it to him ".  Denies any confusion , having regular bowel movements .  Current Outpatient Prescriptions  Medication Sig Dispense Refill  . folic acid (FOLVITE) 1 MG tablet Take 1 tablet (1 mg total) by mouth daily. 30 tablet 0  . furosemide (LASIX) 40 MG tablet Take 1 tablet (40 mg total) by mouth 2 (two) times daily. 60 tablet 1  . pantoprazole (PROTONIX) 40 MG tablet Take 1 tablet (40 mg total) by mouth 2 (two) times daily before a meal. 60 tablet 1  . spironolactone (ALDACTONE) 100 MG tablet Take 1 tablet (100 mg total) by mouth daily. 30 tablet 1  . sulfamethoxazole-trimethoprim (BACTRIM DS,SEPTRA DS) 800-160 MG tablet Take 1 tablet by mouth daily. 30 tablet 1  . HYDROmorphone (DILAUDID) 2 MG tablet START WITH 1 TABLET EVERY 4  HOURS ON DAY 1, THEN DECREASE FREQUENCY AS DIRECTED ON ATTACHED SHEET  0  . lactulose (CHRONULAC) 10 GM/15ML solution Take 67.5 mLs (45 g total) by mouth 2 (two) times daily. (Patient not taking: Reported on 01/15/2017) 1892 mL 1  . nadolol (CORGARD) 20 MG tablet Take 1 tablet (20 mg total) by mouth daily. (Patient not taking: Reported on 01/15/2017) 30 tablet 1   Current Facility-Administered Medications  Medication Dose Route Frequency Provider Last Rate Last Dose  . albumin human 25 % solution 50 g  50 g Intravenous Once Wyline MoodKiran Abelardo Seidner, MD        Allergies as of 01/15/2017  . (No Known Allergies)    ROS:  General: Negative for anorexia, weight loss, fever, chills, fatigue, weakness. ENT: Negative for hoarseness, difficulty swallowing , nasal congestion. CV: Negative for chest pain, angina, palpitations, dyspnea on exertion, peripheral edema.  Respiratory: Negative for dyspnea at rest, dyspnea on exertion, cough, sputum, wheezing.  GI: See history of present illness. GU:  Negative for dysuria, hematuria, urinary incontinence, urinary frequency, nocturnal urination.  Endo: Negative for unusual weight change.    Physical Examination:   BP 118/72 (BP Location: Left Arm, Patient Position: Sitting, Cuff Size: Normal)   Pulse 85   Ht 6\' 1"  (1.854 m)   Wt 206 lb 3.2 oz (93.5 kg)   BMI 27.20 kg/m   General: Well-nourished,  well-developed in no acute distress.  Eyes: No icterus. Conjunctivae pink. Mouth: Oropharyngeal mucosa moist and pink , no lesions erythema or exudate. Lungs: Clear to auscultation bilaterally. Non-labored. Heart: Regular rate and rhythm, no murmurs rubs or gallops.  Abdomen: Bowel sounds are normal, nontender, nondistended, no hepatosplenomegaly or masses, no abdominal bruits or hernia , no rebound or guarding.   Extremities: No lower extremity edema. No clubbing or deformities. Neuro: Alert and oriented x 3.  Grossly intact. Skin: Warm and dry, no jaundice.   Psych:  Alert and cooperative, normal mood and affect.   Imaging Studies: US Paracentesis  Result Date: 01/04/2017 CLINICAL DATA:  Cirrhosis with recurrent large volume abdominal ascites EXAM: ULTRASOUND GUIDED PARACENTESIS TECHNIQUE: The procedure, risks (including but not limited to bleeding, infection, organ damage ), benefits, and alternatives were explained to the patient. Questions regarding the procedure were encouraged and answered. The patient understands and consents to the procedure. Survey ultrasound of the abdomen was performed and an appropriate skin entry site in the right lower abdomen was selected. Skin site was marked, prepped with chlorhexidine, and draped in usual sterile fashion, and infiltrated locally with 1% lidocaine. A Safe-T-Centesis needle was advanced into the peritoneal space until fluid could be aspirated. The sheath was advanced and the needle removed. 10 L of clear yellowascites were aspirated. COMPLICATIONS: COMPLICATIONS none IMPRESSION: Technically successful ultrasound guided paracentesis, removing 10 L of ascites. Electronically Signed   By: Corlis Leak M.D.   On: 01/04/2017 16:20   US Paracentesis  Result Date: 12/18/2016 INDICATION: Cirrhosis and ascites. EXAM: ULTRASOUND GUIDED PARACENTESIS MEDICATIONS: None. COMPLICATIONS: None immediate. PROCEDURE: Informed written consent was obtained from the patient after a discussion of the risks, benefits and alternatives to treatment. A timeout was performed prior to the initiation of the procedure. Initial ultrasound was performed to localize ascites. The left lower abdomen was prepped and draped in the usual sterile fashion. 1% lidocaine was used for local anesthesia. Following this, a 6 Fr Safe-T-Centesis catheter was introduced. An ultrasound image was saved for documentation purposes. The paracentesis was performed. The catheter was removed and a dressing was applied. The patient tolerated the procedure well without immediate  post procedural complication. FINDINGS: A total of approximately 4.5 L of yellow fluid was removed. IMPRESSION: Successful ultrasound-guided paracentesis yielding 4.5 liters of peritoneal fluid. Electronically Signed   By: Irish Lack M.D.   On: 12/18/2016 11:28    Assessment and Plan:   Antonio Ryan is a 61 y.o. y/o male with history of alcoholic and hepatitis C related cirrhosis, ascites, esophageal varices(non bleeding ),PHG, with ongoing alcohol use. Here to establish care. No evidence of encephalopathy.    Plan   1. Increase aldactone to 100 mg BID 2. Start on nadolol 20 mg once daily . 3. Very low sodium diet <2 grams a day 4. Check labs to calculate meld score, Marjo Bicker pugs score 5. No frozen meals/soups in cans. 6. EGD in 2019  7. RUQ USG in 03/2017 to screen for hepatoma 8. Stop all alcohol 9. Viral and autoimmune screen - will decide on vaccinations next visit 10. Will plan to treat when stopped all alcohol for atleast 5-6 months.  11. Needs to establish care with PCP which he has not yet .   Dr Wyline Mood  MD Follow up in 1 months

## 2017-01-28 ENCOUNTER — Other Ambulatory Visit: Payer: Self-pay | Admitting: Gastroenterology

## 2017-01-28 ENCOUNTER — Other Ambulatory Visit: Payer: Self-pay

## 2017-01-28 ENCOUNTER — Telehealth: Payer: Self-pay

## 2017-01-28 DIAGNOSIS — K7031 Alcoholic cirrhosis of liver with ascites: Secondary | ICD-10-CM

## 2017-01-28 MED ORDER — ALBUMIN HUMAN 25 % IV SOLN: 50.0000 g | Freq: Once | INTRAVENOUS | Status: DC

## 2017-01-28 NOTE — Telephone Encounter (Signed)
Advised pt of paracentesis scheduled for Thursday, 4/12 @ 1030am.

## 2017-01-31 ENCOUNTER — Telehealth: Payer: Self-pay | Admitting: Gastroenterology

## 2017-01-31 ENCOUNTER — Other Ambulatory Visit: Payer: Self-pay | Admitting: Gastroenterology

## 2017-01-31 ENCOUNTER — Ambulatory Visit
Admission: RE | Admit: 2017-01-31 | Discharge: 2017-01-31 | Disposition: A | Discharge status: Home / Self Care | Payer: Medicaid Other | Source: Ambulatory Visit | Attending: Gastroenterology | Admitting: Gastroenterology

## 2017-01-31 DIAGNOSIS — K7031 Alcoholic cirrhosis of liver with ascites: Secondary | ICD-10-CM | POA: Diagnosis present

## 2017-01-31 MED ORDER — ALBUMIN HUMAN 25 % IV SOLN
25.0000 g | Freq: Once | INTRAVENOUS | Status: DC
  Filled 2017-01-31: qty 100

## 2017-01-31 NOTE — Telephone Encounter (Signed)
Patient calling about Medication Management and if his prescriptions have been sent to them. Please call patient.

## 2017-01-31 NOTE — OR Nursing (Signed)
Per Dr Bonnielee Haff, not enough fluid to draw, cancelled order for albumin abd and paracentesis

## 2017-02-04 ENCOUNTER — Telehealth: Payer: Self-pay

## 2017-02-04 NOTE — Telephone Encounter (Signed)
Contacted Medical Management on behalf of the patient.   [place] [facility] states patient scheduled Eligibility appointment in January = no show. Advised that patient should reschedule appointment to qualify for receiving his prescriptions.   Contacted patient and advised that his May appointment be moved up and that M.M. Is willing to give him medication until appointment date.

## 2017-02-18 ENCOUNTER — Ambulatory Visit: Payer: Self-pay | Admitting: Gastroenterology

## 2017-02-21 ENCOUNTER — Other Ambulatory Visit: Payer: Self-pay

## 2017-02-21 DIAGNOSIS — K7031 Alcoholic cirrhosis of liver with ascites: Secondary | ICD-10-CM

## 2017-02-21 DIAGNOSIS — I8510 Secondary esophageal varices without bleeding: Secondary | ICD-10-CM

## 2017-02-21 MED ORDER — NADOLOL 20 MG PO TABS: 20.0000 mg | ORAL_TABLET | Freq: Every day | ORAL | 3 refills | Status: DC

## 2017-02-21 MED ORDER — SPIRONOLACTONE 100 MG PO TABS: 100.0000 mg | ORAL_TABLET | Freq: Two times a day (BID) | ORAL | 0 refills | Status: DC

## 2017-02-27 ENCOUNTER — Telehealth: Payer: Self-pay | Admitting: Gastroenterology

## 2017-02-27 NOTE — Telephone Encounter (Signed)
[facility] called and needs a prior auth on nadolol (CORGARD) 20 MG tablet

## 2017-03-01 ENCOUNTER — Other Ambulatory Visit: Payer: Self-pay

## 2017-03-01 NOTE — Telephone Encounter (Signed)
Called pharmacy and advised PCP would have to pre-auth nadolol.   GI has no history of rule/out medications needed for pre-auth.

## 2017-03-05 ENCOUNTER — Telehealth: Payer: Self-pay | Admitting: Gastroenterology

## 2017-03-05 ENCOUNTER — Ambulatory Visit: Payer: Self-pay

## 2017-03-05 NOTE — Telephone Encounter (Signed)
Patient left a voice message that he needs to speak to you regarding Medicaid will not refill his RX that Dr. Tobi BastosAnna gave him until they speak to Dr. Tobi BastosAnna. He was wondering what was going on. Please call

## 2017-03-12 ENCOUNTER — Telehealth: Payer: Self-pay | Admitting: Gastroenterology

## 2017-03-12 NOTE — Telephone Encounter (Signed)
Patient left a voice message that he has a medication question. Please call °

## 2017-03-14 NOTE — Telephone Encounter (Signed)
Advised patient that insurance rejected Nadolol.   Sent message to Dr. Tobi BastosAnna requesting replacement Rx.

## 2017-03-21 ENCOUNTER — Telehealth: Payer: Self-pay | Admitting: Gastroenterology

## 2017-03-21 NOTE — Telephone Encounter (Signed)
Patient stated you were going to refer him to a PCP and he is still waiting. Please call today.

## 2017-03-22 ENCOUNTER — Telehealth: Payer: Self-pay

## 2017-03-22 ENCOUNTER — Other Ambulatory Visit: Payer: Self-pay

## 2017-03-22 DIAGNOSIS — K7031 Alcoholic cirrhosis of liver with ascites: Secondary | ICD-10-CM

## 2017-03-22 MED ORDER — PROPRANOLOL HCL 20 MG PO TABS: 20.0000 mg | ORAL_TABLET | Freq: Two times a day (BID) | ORAL | 3 refills | Status: DC

## 2017-03-22 NOTE — Telephone Encounter (Signed)
-----   Message from Wyline MoodKiran Anna, MD sent at 03/21/2017 10:03 AM EDT ----- Regarding: RE: Replacment Med Ok d/c nadolol   Start propronolol 20 mg BID- if has diziness/ feels very light head  to stop and call us .   Can come in a week to have his BP and pulse checked for a nurse visit  Kiran  ----- Message ----- From: Ethlyn Galleryarter, Dawna Jakes, CMA Sent: 03/21/2017   9:06 AM To: Wyline MoodKiran Anna, MD Subject: RE: Replacment Med                             Propanolol  ----- Message ----- From: Wyline MoodAnna, Kiran, MD Sent: 03/18/2017   6:44 PM To: Ethlyn GalleryPanya Modine Oppenheimer, CMA Subject: RE: Replacment Med                             Can you find out which drug they want us to use??   ----- Message ----- From: Ethlyn Galleryarter, Dezmond Downie, CMA Sent: 03/14/2017   3:59 PM To: Wyline MoodKiran Anna, MD Subject: Replacment Med                                 Insurance has rejected Nadolol for patient.   Replacement medicine?

## 2017-03-22 NOTE — Telephone Encounter (Signed)
Advised pt of change in Rx per Dr. Tobi BastosAnna.   Ok d/c nadolol   Start propronolol 20 mg BID- if has diziness/ feels very light head to stop and call us .   Can come in a week to have his BP and pulse checked for a nurse visit.   Also advised patient that referral was sent to Dr. Eustaquio BoydenJavier Gutierrez for PCP and gave phone number for patient to call for follow-up on referral.

## 2017-03-25 ENCOUNTER — Encounter: Payer: Self-pay | Admitting: *Deleted

## 2017-03-25 ENCOUNTER — Emergency Department
Admission: EM | Admit: 2017-03-25 | Discharge: 2017-03-26 | Disposition: A | Discharge status: Home / Self Care | Payer: Medicaid Other | Attending: Emergency Medicine | Admitting: Emergency Medicine

## 2017-03-25 ENCOUNTER — Emergency Department: Payer: Medicaid Other

## 2017-03-25 DIAGNOSIS — I10 Essential (primary) hypertension: Secondary | ICD-10-CM | POA: Diagnosis not present

## 2017-03-25 DIAGNOSIS — K852 Alcohol induced acute pancreatitis without necrosis or infection: Secondary | ICD-10-CM | POA: Diagnosis not present

## 2017-03-25 DIAGNOSIS — Z79899 Other long term (current) drug therapy: Secondary | ICD-10-CM | POA: Insufficient documentation

## 2017-03-25 DIAGNOSIS — Z87891 Personal history of nicotine dependence: Secondary | ICD-10-CM | POA: Diagnosis not present

## 2017-03-25 DIAGNOSIS — R109 Unspecified abdominal pain: Secondary | ICD-10-CM | POA: Diagnosis present

## 2017-03-25 LAB — CBC WITH DIFFERENTIAL/PLATELET
BASOS ABS: 0 10*3/uL (ref 0–0.1)
Basophils Relative: 1 %
Eosinophils Absolute: 0.1 10*3/uL (ref 0–0.7)
Eosinophils Relative: 1 %
HEMATOCRIT: 37.1 % — AB (ref 40.0–52.0)
Hemoglobin: 12.6 g/dL — ABNORMAL LOW (ref 13.0–18.0)
LYMPHS PCT: 18 %
Lymphs Abs: 1.1 10*3/uL (ref 1.0–3.6)
MCH: 32.5 pg (ref 26.0–34.0)
MCHC: 34 g/dL (ref 32.0–36.0)
MCV: 95.5 fL (ref 80.0–100.0)
MONO ABS: 0.8 10*3/uL (ref 0.2–1.0)
Monocytes Relative: 13 %
NEUTROS ABS: 4.2 10*3/uL (ref 1.4–6.5)
Neutrophils Relative %: 67 %
Platelets: 126 10*3/uL — ABNORMAL LOW (ref 150–440)
RBC: 3.89 MIL/uL — AB (ref 4.40–5.90)
RDW: 15.6 % — ABNORMAL HIGH (ref 11.5–14.5)
WBC: 6.2 10*3/uL (ref 3.8–10.6)

## 2017-03-25 LAB — COMPREHENSIVE METABOLIC PANEL
ALBUMIN: 3.1 g/dL — AB (ref 3.5–5.0)
ALT: 53 U/L (ref 17–63)
ANION GAP: 8 (ref 5–15)
AST: 85 U/L — ABNORMAL HIGH (ref 15–41)
Alkaline Phosphatase: 60 U/L (ref 38–126)
BILIRUBIN TOTAL: 1.6 mg/dL — AB (ref 0.3–1.2)
BUN: 8 mg/dL (ref 6–20)
CO2: 25 mmol/L (ref 22–32)
Calcium: 8.2 mg/dL — ABNORMAL LOW (ref 8.9–10.3)
Chloride: 103 mmol/L (ref 101–111)
Creatinine, Ser: 0.72 mg/dL (ref 0.61–1.24)
GFR calc Af Amer: >60 mL/min (ref >60)
GFR calc non Af Amer: >60 mL/min (ref >60)
GLUCOSE: 116 mg/dL — AB (ref 65–99)
POTASSIUM: 3.6 mmol/L (ref 3.5–5.1)
Sodium: 136 mmol/L (ref 135–145)
TOTAL PROTEIN: 6.9 g/dL (ref 6.5–8.1)

## 2017-03-25 LAB — URINE DRUG SCREEN, QUALITATIVE (ARMC ONLY)
Amphetamines, Ur Screen: NOT DETECTED
BARBITURATES, UR SCREEN: NOT DETECTED
Benzodiazepine, Ur Scrn: NOT DETECTED
CANNABINOID 50 NG, UR ~~LOC~~: NOT DETECTED
Cocaine Metabolite,Ur ~~LOC~~: NOT DETECTED
MDMA (Ecstasy)Ur Screen: NOT DETECTED
Methadone Scn, Ur: NOT DETECTED
Opiate, Ur Screen: NOT DETECTED
Phencyclidine (PCP) Ur S: NOT DETECTED
TRICYCLIC, UR SCREEN: NOT DETECTED

## 2017-03-25 LAB — URINALYSIS, COMPLETE (UACMP) WITH MICROSCOPIC
BACTERIA UA: NONE SEEN
BILIRUBIN URINE: NEGATIVE
Glucose, UA: NEGATIVE mg/dL
Hgb urine dipstick: NEGATIVE
KETONES UR: 5 mg/dL — AB
Leukocytes, UA: NEGATIVE
Nitrite: NEGATIVE
PROTEIN: NEGATIVE mg/dL
SQUAMOUS EPITHELIAL / LPF: NONE SEEN
Specific Gravity, Urine: 1.019 (ref 1.005–1.030)
pH: 5 (ref 5.0–8.0)

## 2017-03-25 LAB — ETHANOL: ALCOHOL ETHYL (B): 24 mg/dL — AB (ref <5)

## 2017-03-25 LAB — LIPASE, BLOOD: Lipase: 67 U/L — ABNORMAL HIGH (ref 11–51)

## 2017-03-25 MED ORDER — PROMETHAZINE HCL 25 MG PO TABS: 25.0000 mg | ORAL_TABLET | Freq: Four times a day (QID) | ORAL | 0 refills | Status: DC | PRN

## 2017-03-25 MED ORDER — MORPHINE SULFATE (PF) 4 MG/ML IV SOLN
4.0000 mg | Freq: Once | INTRAVENOUS | Status: AC
  Administered 2017-03-25: 4 mg via INTRAVENOUS
  Filled 2017-03-25: qty 1

## 2017-03-25 MED ORDER — PROMETHAZINE HCL 25 MG/ML IJ SOLN
25.0000 mg | Freq: Once | INTRAMUSCULAR | Status: AC
  Administered 2017-03-25: 25 mg via INTRAVENOUS
  Filled 2017-03-25: qty 1

## 2017-03-25 MED ORDER — SODIUM CHLORIDE 0.9 % IV SOLN
Freq: Once | INTRAVENOUS | Status: AC
  Administered 2017-03-25: 20:00:00 via INTRAVENOUS

## 2017-03-25 NOTE — ED Provider Notes (Signed)
[facility] Emergency Department Provider Note       Time seen: ----------------------------------------- 7:12 PM on 03/25/2017 -----------------------------------------     I have reviewed the triage vital signs and the nursing notes.   HISTORY   Chief Complaint No chief complaint on file.    HPI [place] is a 61 y.o. male who presents to the ED for abdominal pain as well as nausea, vomiting and diarrhea. Patient states symptoms started around noon today. He has a chronic history of ascites and states he has been admitted to the hospital before for similar complaints. He had a new prescription medication called in by his gastroenterologist yesterday. He denies fevers, chills, chest pain, shortness of breath but again has had vomiting and diarrhea. Nothing makes symptoms better.   Past Medical History:  Diagnosis Date  . Acid reflux   . Cirrhosis (HCC)   . Hepatitis C   . Hypertension   . Neuropathy (HCC)   . Spinal stenosis     Patient Active Problem List   Diagnosis Date Noted  . Esophageal varices without bleeding (HCC)   . Ascites 12/14/2016  . SBP (spontaneous bacterial peritonitis) (HCC)   . Ascites due to alcoholic cirrhosis (HCC)   . Generalized abdominal pain   . Hematemesis 09/27/2016  . GI bleed 09/27/2016    Past Surgical History:  Procedure Laterality Date  . BACK SURGERY    . ESOPHAGOGASTRODUODENOSCOPY (EGD) WITH PROPOFOL N/A 12/17/2016   Procedure: ESOPHAGOGASTRODUODENOSCOPY (EGD) WITH PROPOFOL;  Surgeon: Wyline MoodKiran Anna, MD;  Location: [facility] ENDOSCOPY;  Service: Gastroenterology;  Laterality: N/A;  . PARACENTESIS      Allergies Patient has no known allergies.  Social History Social History  Substance Use Topics  . Smoking status: Former Games developermoker  . Smokeless tobacco: Never Used  . Alcohol use 2.4 oz/week    4 Glasses of wine per week    Review of Systems Constitutional: Negative for fever. Eyes: Negative for  vision changes ENT:  Negative for congestion, sore throat Cardiovascular: Negative for chest pain. Respiratory: Negative for shortness of breath. Gastrointestinal: Positive for abdominal pain, vomiting and diarrhea Genitourinary: Negative for dysuria. Musculoskeletal: Negative for back pain. Skin: Negative for rash. Neurological: Negative for headaches, focal weakness or numbness.  All systems negative/normal/unremarkable except as stated in the HPI  ____________________________________________   PHYSICAL EXAM:  VITAL SIGNS: ED Triage Vitals  Enc Vitals Group     BP      Pulse      Resp      Temp      Temp src      SpO2      Weight      Height      Head Circumference      Peak Flow      Pain Score      Pain Loc      Pain Edu?      Excl. in GC?     Constitutional: Alert and oriented. Well appearing and in no distress. Eyes: Conjunctivae are normal. Normal extraocular movements. ENT   Head: Normocephalic and atraumatic.   Nose: No congestion/rhinnorhea.   Mouth/Throat: Mucous membranes are moist.   Neck: No stridor. Cardiovascular: Normal rate, regular rhythm. No murmurs, rubs, or gallops. Respiratory: Normal respiratory effort without tachypnea nor retractions. Breath sounds are clear and equal bilaterally. No wheezes/rales/rhonchi. Gastrointestinal: Diffuse nonfocal tenderness, no rebound or guarding. Normal bowel sounds. Musculoskeletal: Nontender with normal range of motion in extremities. No lower extremity tenderness nor  edema. Neurologic:  Normal speech and language. No gross focal neurologic deficits are appreciated.  Skin:  Skin is warm, dry and intact. No rash noted. Psychiatric: Mood and affect are normal. Speech and behavior are normal.  ____________________________________________  EKG: Interpreted by me. Sinus rhythm rate 83 bpm, normal PR normal, normal QRS, normal QT.  ____________________________________________  ED  COURSE:  Pertinent labs & imaging results that were available during my care of the patient were reviewed by me and considered in my medical decision making (see chart for details). Patient presents for abdominal pain with vomiting and diarrhea, we will assess with labs and imaging as indicated.   Procedures ____________________________________________   LABS (pertinent positives/negatives)  Labs Reviewed  CBC WITH DIFFERENTIAL/PLATELET - Abnormal; Notable for the following:       Result Value   RBC 3.89 (*)    Hemoglobin 12.6 (*)    HCT 37.1 (*)    RDW 15.6 (*)    Platelets 126 (*)    All other components within normal limits  URINALYSIS, COMPLETE (UACMP) WITH MICROSCOPIC - Abnormal; Notable for the following:    Color, Urine AMBER (*)    APPearance CLEAR (*)    Ketones, ur 5 (*)    All other components within normal limits  ETHANOL - Abnormal; Notable for the following:    Alcohol, Ethyl (B) 24 (*)    All other components within normal limits  COMPREHENSIVE METABOLIC PANEL - Abnormal; Notable for the following:    Glucose, Bld 116 (*)    Calcium 8.2 (*)    Albumin 3.1 (*)    AST 85 (*)    Total Bilirubin 1.6 (*)    All other components within normal limits  LIPASE, BLOOD - Abnormal; Notable for the following:    Lipase 67 (*)    All other components within normal limits  URINE DRUG SCREEN, QUALITATIVE ([facility] ONLY)    RADIOLOGY  Abdomen 2 view IMPRESSION: Nonobstructive bowel gas pattern. ____________________________________________  FINAL ASSESSMENT AND PLAN  Abdominal pain, vomiting and diarrhea, chronic alcohol abuse  Plan: Patient's labs and imaging were dictated above. Patient had presented for Abdominal pain as well as vomiting and diarrhea. Most his symptoms seem to be secondary to chronic alcoholism and mild pancreatitis. He received Phenergan and morphine here. He'll be discharged with a prescription for Phenergan. He is stable for outpatient follow-up  with gastroenterology.   Emily Filbert, MD   Note: This note was generated in part or whole with voice recognition software. Voice recognition is usually quite accurate but there are transcription errors that can and very often do occur. I apologize for any typographical errors that were not detected and corrected.     Emily Filbert, MD 03/25/17 2245

## 2017-03-25 NOTE — ED Notes (Signed)
ED Provider at bedside. 

## 2017-03-25 NOTE — ED Triage Notes (Signed)
Pt brought in via ems with abd pain and n/v/d.  Iv in place.  Pt alert. md at bedside.

## 2017-03-25 NOTE — ED Notes (Signed)
Pt reports n/v/d since yesterday.  Pt has ascites and last drank alcohol yesterday.  Pt also reports back pain.  Pt alert on arrival.  md at bedside.

## 2017-03-25 NOTE — ED Notes (Signed)
Patient transported to X-ray 

## 2017-03-26 NOTE — ED Notes (Signed)
Pt sleeping in bed

## 2017-03-26 NOTE — ED Notes (Signed)
Pt waiting for a ride, sleeping in bed

## 2017-03-26 NOTE — ED Notes (Signed)
Pt sleeping. 

## 2017-03-26 NOTE — ED Notes (Signed)
Pt in hallway bed   Pt awake and alert after eating dinner tray.

## 2017-03-26 NOTE — ED Notes (Signed)
Report off to jill rn 

## 2017-03-26 NOTE — ED Notes (Signed)
Pt eating dinner tray °

## 2017-03-26 NOTE — ED Notes (Signed)
Pt moved to 19h.  Pt unable to find a ride home.  Ivdc'ed.  Pt alert.

## 2017-03-28 ENCOUNTER — Ambulatory Visit: Payer: Self-pay | Admitting: Gastroenterology

## 2017-05-02 ENCOUNTER — Ambulatory Visit: Payer: Self-pay | Admitting: Gastroenterology

## 2017-05-10 ENCOUNTER — Ambulatory Visit: Payer: Self-pay | Admitting: Family Medicine

## 2017-05-23 ENCOUNTER — Ambulatory Visit: Payer: Medicaid Other | Admitting: Family Medicine

## 2017-06-10 ENCOUNTER — Other Ambulatory Visit: Payer: Self-pay | Admitting: Gastroenterology

## 2017-06-10 DIAGNOSIS — I8510 Secondary esophageal varices without bleeding: Secondary | ICD-10-CM

## 2017-06-10 DIAGNOSIS — K7031 Alcoholic cirrhosis of liver with ascites: Secondary | ICD-10-CM

## 2017-06-26 ENCOUNTER — Encounter: Payer: Self-pay | Admitting: Gastroenterology

## 2017-06-26 ENCOUNTER — Ambulatory Visit: Payer: Self-pay | Admitting: Gastroenterology

## 2017-07-08 ENCOUNTER — Other Ambulatory Visit: Payer: Self-pay

## 2017-07-08 ENCOUNTER — Telehealth: Payer: Self-pay | Admitting: Gastroenterology

## 2017-07-08 MED ORDER — PANTOPRAZOLE SODIUM 40 MG PO TBEC: 40.0000 mg | DELAYED_RELEASE_TABLET | Freq: Two times a day (BID) | ORAL | 1 refills | Status: DC

## 2017-07-08 NOTE — Telephone Encounter (Signed)
Needs refill on protonix [facility] in [place] Patient has been sick with vertigo. That's why he no showed the last appt

## 2017-07-30 ENCOUNTER — Encounter: Payer: Self-pay | Admitting: Family Medicine

## 2017-07-30 ENCOUNTER — Ambulatory Visit (INDEPENDENT_AMBULATORY_CARE_PROVIDER_SITE_OTHER): Payer: Medicaid Other | Admitting: Family Medicine

## 2017-07-30 VITALS — BP 105/68 | HR 59 | Temp 98.4°F | Ht 72.5 in | Wt 188.2 lb

## 2017-07-30 DIAGNOSIS — Z13220 Encounter for screening for lipoid disorders: Secondary | ICD-10-CM

## 2017-07-30 DIAGNOSIS — G6289 Other specified polyneuropathies: Secondary | ICD-10-CM | POA: Diagnosis not present

## 2017-07-30 DIAGNOSIS — M4726 Other spondylosis with radiculopathy, lumbar region: Secondary | ICD-10-CM

## 2017-07-30 DIAGNOSIS — K7031 Alcoholic cirrhosis of liver with ascites: Secondary | ICD-10-CM | POA: Diagnosis not present

## 2017-07-30 DIAGNOSIS — I10 Essential (primary) hypertension: Secondary | ICD-10-CM | POA: Diagnosis not present

## 2017-07-30 DIAGNOSIS — Z125 Encounter for screening for malignant neoplasm of prostate: Secondary | ICD-10-CM

## 2017-07-30 DIAGNOSIS — B182 Chronic viral hepatitis C: Secondary | ICD-10-CM

## 2017-07-30 DIAGNOSIS — I8510 Secondary esophageal varices without bleeding: Secondary | ICD-10-CM

## 2017-07-30 DIAGNOSIS — H5213 Myopia, bilateral: Secondary | ICD-10-CM

## 2017-07-30 DIAGNOSIS — R634 Abnormal weight loss: Secondary | ICD-10-CM | POA: Diagnosis not present

## 2017-07-30 DIAGNOSIS — G629 Polyneuropathy, unspecified: Secondary | ICD-10-CM | POA: Insufficient documentation

## 2017-07-30 DIAGNOSIS — B1920 Unspecified viral hepatitis C without hepatic coma: Secondary | ICD-10-CM | POA: Insufficient documentation

## 2017-07-30 DIAGNOSIS — H9193 Unspecified hearing loss, bilateral: Secondary | ICD-10-CM | POA: Diagnosis not present

## 2017-07-30 MED ORDER — GABAPENTIN 600 MG PO TABS: 1800.0000 mg | ORAL_TABLET | Freq: Two times a day (BID) | ORAL | 1 refills | Status: DC

## 2017-07-30 NOTE — Assessment & Plan Note (Signed)
Under good control. Possibly a bit low. Needs to be on beta blocker and lasix and spironalactone due to his liver function. Continue to monitor. Call with any concerns.

## 2017-07-30 NOTE — Assessment & Plan Note (Signed)
Chronic. 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 Has not had an MRI in 5 years, now with worsening numbness and tingling in his R leg, which had initially resolved after surgery. Concern for adjacent disc disease or failed back. Will obtain new MRI. Await results. Referral to neurosurgery made today.

## 2017-07-30 NOTE — Assessment & Plan Note (Signed)
Strongly encouraged patient to stop all drinking. Checking labs today. Will get him back into GI. Appointment scheduled today. Call with any concerns.

## 2017-07-30 NOTE — Assessment & Plan Note (Signed)
Had EGD done with Dr. Tobi Bastos. No concerns at this time. Checking CBC today.

## 2017-07-30 NOTE — Progress Notes (Signed)
BP 105/68 (BP Location: Left Arm, Patient Position: Sitting, Cuff Size: Large)   Pulse (!) 59   Temp 98.4 F (36.9 C)   Ht 6' 0.5" (1.842 m)   Wt 188 lb 4 oz (85.4 kg)   SpO2 100%   BMI 25.18 kg/m    Subjective:    Patient ID: Antonio Ryan, male    DOB: 1956-07-26, 61 y.o.   MRN: [id number]  HPI: Antonio Ryan is a 61 y.o. male  Chief Complaint  Patient presents with  . Establish Care  . Referral    [place], [place]  . Back Pain    Patient would like to have another MRI, he has not had one in 5 years   HYPERTENSION Hypertension status: controlled  Satisfied with current treatment? yes Duration of hypertension: chronic BP monitoring frequency:  not checking BP range:  BP medication side effects:  yes Medication compliance: good compliance Previous BP meds: propranolol Aspirin: no Recurrent headaches: no Visual changes: yes Palpitations: no Dyspnea: no Chest pain: yes Lower extremity edema: no Dizzy/lightheaded: yes  HEPATITIS C- with ascites. Follows with Dr. Tobi Bastos since he was hospitalized in February 2018. Has history of hep C and alcoholic cirrhosis. He requires large volume paracentesis. Hasn't seen him since March, still drinking about 1x a week- glass of wine with dinner Duration since diagnosis: 2001 Hep C transmission: unknown Genotype: 1b Viral load:  unknown Hepatology evaluation:yes Liver biopsy:no  Cirrhosis: yes Antiviral therapy:no- still drinking, planning to start when off ETOH Hepatocellular carcinoma screening: yes Esophageal varices screening/EGD: yes- 12/17/16- + for varices Hepatitis A Vaccine: unknown- thinks that he got it when he was in [place] Hepatitis B Vaccine: unknown- - thinks that he got it when he was in [place] Pneumovax Vaccine: Up to Date- couple of years ago  NEUROPATHY Neuropathy status: stable  Satisfied with current treatment?: yes Medication side effects: no Medication compliance:  excellent  compliance Location: bilateral feet Pain: yes Severity: severe  Quality:  Shooting pain Frequency: constant Bilateral: yes Symmetric: R foot > left foot Numbness: yes Decreased sensation: yes Weakness: yes Context: stable Alleviating factors: gabapentin  Treatments attempted: amitriptyline, gabapentin  BACK PAIN Duration: since 2008 Mechanism of injury: lifting, had surgery L4-5 with fusion, now pain is back Location: bilateral and low back Onset: gradual Severity: severe Quality: sharp Frequency: constant, waxes and wanes  Radiation: R leg below the knee and L leg below the knee Aggravating factors: lifting, movement and walking Alleviating factors: rest Status: stable Treatments attempted: muscle relaxers, pain medicine, rest, ice, heat, APAP, ibuprofen, aleve and HEP  Relief with NSAIDs?: mild Nighttime pain:  yes Paresthesias / decreased sensation:  yes Bowel / bladder incontinence:  no Fevers:  no Dysuria / urinary frequency:  no   Active Ambulatory Problems    Diagnosis Date Noted  . Ascites due to alcoholic cirrhosis (HCC)   . Esophageal varices without bleeding (HCC)   . Hepatitis C 07/30/2017  . Essential hypertension 07/30/2017  . Osteoarthritis of spine with radiculopathy, lumbar region 07/30/2017  . Peripheral neuropathy 07/30/2017   Resolved Ambulatory Problems    Diagnosis Date Noted  . Hematemesis 09/27/2016  . GI bleed 09/27/2016  . Generalized abdominal pain   . SBP (spontaneous bacterial peritonitis) (HCC)   . Ascites 12/14/2016   Past Medical History:  Diagnosis Date  . Acid reflux   . Ascites   . Cirrhosis (HCC)   . Generalized abdominal pain   . GI bleed 09/27/2016  .  Hematemesis 09/27/2016  . Hepatitis C   . Hypertension   . Neuropathy   . SBP (spontaneous bacterial peritonitis) (HCC)   . Spinal stenosis   . Vertigo    Past Surgical History:  Procedure Laterality Date  . BACK SURGERY    . ESOPHAGOGASTRODUODENOSCOPY (EGD) WITH  PROPOFOL N/A 12/17/2016   Procedure: ESOPHAGOGASTRODUODENOSCOPY (EGD) WITH PROPOFOL;  Surgeon: Wyline Mood, MD;  Location: [facility] ENDOSCOPY;  Service: Gastroenterology;  Laterality: N/A;  . HAND SURGERY Right    Bone and skin  . PARACENTESIS     Outpatient Encounter Prescriptions as of 07/30/2017  Medication Sig Note  . gabapentin (NEURONTIN) 600 MG tablet Take 3 tablets (1,800 mg total) by mouth 2 (two) times daily.   . [DISCONTINUED] gabapentin (NEURONTIN) 600 MG tablet Take 1,800 mg by mouth 2 (two) times daily.   . furosemide (LASIX) 40 MG tablet Take 1 tablet (40 mg total) by mouth 2 (two) times daily. 01/03/2017: Patient needs new script  . pantoprazole (PROTONIX) 40 MG tablet Take 1 tablet (40 mg total) by mouth 2 (two) times daily before a meal.   . propranolol (INDERAL) 20 MG tablet Take 1 tablet (20 mg total) by mouth 2 (two) times daily.   [name] spironolactone (ALDACTONE) 100 MG tablet TAKE 1 TABLET BY MOUTH TWICE A DAY   . [DISCONTINUED] AFLURIA QUADRIVALENT 0.5 ML injection TO BE ADMINISTERED BY PHARMACIST FOR IMMUNIZATION   . [DISCONTINUED] folic acid (FOLVITE) 1 MG tablet Take 1 tablet (1 mg total) by mouth daily. 01/03/2017: Pt needs new script  . [DISCONTINUED] HYDROmorphone (DILAUDID) 2 MG tablet START WITH 1 TABLET EVERY 4 HOURS ON DAY 1, THEN DECREASE FREQUENCY AS DIRECTED ON ATTACHED SHEET   . [DISCONTINUED] lactulose (CHRONULAC) 10 GM/15ML solution Take 67.5 mLs (45 g total) by mouth 2 (two) times daily. (Patient not taking: Reported on 01/15/2017) 01/03/2017: Patient needs refill  . [DISCONTINUED] nadolol (CORGARD) 20 MG tablet Take 1 tablet (20 mg total) by mouth daily.   . [DISCONTINUED] promethazine (PHENERGAN) 25 MG tablet Take 1 tablet (25 mg total) by mouth every 6 (six) hours as needed for nausea or vomiting.   . [DISCONTINUED] albumin human 25 % solution 50 g    . [DISCONTINUED] albumin human 25 % solution 50 g     No facility-administered encounter medications on file as of  07/30/2017.    No Known Allergies  Family History  Problem Relation Age of Onset  . Hypertension Mother   . Arthritis-Osteo Mother   . Cancer Father   . Asthma Daughter   . Cancer Sister    Social History   Social History  . Marital status: Divorced    Spouse name: N/A  . Number of children: N/A  . Years of education: N/A   Occupational History  . disabled    Social History Main Topics  . Smoking status: Former Games developer  . Smokeless tobacco: Never Used  . Alcohol use 2.4 oz/week    4 Glasses of wine per week  . Drug use: No  . Sexual activity: Not Currently   Other Topics Concern  . None   Social History Narrative  . None   Review of Systems  Constitutional: Positive for fatigue. Negative for activity change, appetite change, chills, diaphoresis, fever and unexpected weight change.  HENT: Positive for hearing loss. Negative for congestion, dental problem, drooling, ear discharge, ear pain, facial swelling, mouth sores, nosebleeds, postnasal drip, rhinorrhea, sinus pain, sinus pressure, sneezing, sore throat, tinnitus, trouble swallowing  and voice change.   Eyes: Positive for visual disturbance. Negative for photophobia, pain, discharge, redness and itching.  Respiratory: Negative.   Cardiovascular: Negative.   Gastrointestinal: Positive for abdominal distention. Negative for abdominal pain, anal bleeding, blood in stool, constipation, diarrhea, nausea, rectal pain and vomiting.  Musculoskeletal: Positive for back pain, gait problem and myalgias. Negative for arthralgias, joint swelling, neck pain and neck stiffness.  Neurological: Positive for dizziness, weakness, light-headedness and numbness. Negative for tremors, seizures, syncope, facial asymmetry, speech difficulty and headaches.  Psychiatric/Behavioral: Positive for sleep disturbance. Negative for agitation, behavioral problems, confusion, decreased concentration, dysphoric mood, hallucinations, self-injury and  suicidal ideas. The patient is not nervous/anxious and is not hyperactive.     Per HPI unless specifically indicated above     Objective:    BP 105/68 (BP Location: Left Arm, Patient Position: Sitting, Cuff Size: Large)   Pulse (!) 59   Temp 98.4 F (36.9 C)   Ht 6' 0.5" (1.842 m)   Wt 188 lb 4 oz (85.4 kg)   SpO2 100%   BMI 25.18 kg/m   Wt Readings from Last 3 Encounters:  07/30/17 188 lb 4 oz (85.4 kg)  03/25/17 206 lb (93.4 kg)  01/15/17 206 lb 3.2 oz (93.5 kg)     Hearing Screening             Right ear:   Fail    Left ear:   Fail      Visual Acuity Screening   Right eye Left eye Both eyes  Without correction:  With correction:       Physical Exam  Constitutional: He is oriented to person, place, and time. He appears well-developed and well-nourished. No distress.  HENT:  Head: Normocephalic and atraumatic.  Right Ear: Hearing and external ear normal.  Left Ear: Hearing and external ear normal.  Nose: Nose normal.  Mouth/Throat: Oropharynx is clear and moist. No oropharyngeal exudate.  Eyes: Conjunctivae and lids are normal. Right eye exhibits no discharge. Left eye exhibits no discharge. No scleral icterus.  Cardiovascular: Normal rate, regular rhythm, normal heart sounds and intact distal pulses.  Exam reveals no gallop and no friction rub.   No murmur heard. Pulmonary/Chest: Effort normal and breath sounds normal. No respiratory distress. He has no wheezes. He has no rales. He exhibits no tenderness.  Abdominal: Soft. Bowel sounds are normal. He exhibits no distension and no mass. There is no tenderness. There is no rebound and no guarding.  Neurological: He is alert and oriented to person, place, and time.  Skin: Skin is warm, dry and intact. No rash noted. He is not diaphoretic. No erythema. No pallor.  Psychiatric: He has a normal mood and affect. His speech is normal and  behavior is normal. Judgment and thought content normal. Cognition and memory are normal.  Nursing note and vitals reviewed. Back Exam:    Inspection:  Normal spinal curvature.  No deformity, ecchymosis, erythema, or lesions     Palpation:     Midline spinal tenderness: no      Paralumbar tenderness: yes bilateral     Parathoracic tenderness: no      Buttocks tenderness: yesRight     Range of Motion:      Flexion: Fingers to Knees     Extension:Decreased     Lateral bending:Decreased    Rotation:Decreased    Neuro Exam:Abnormal- decreased sensation and 4/5 strength on the R, otherwise  normal.     Special Tests:      Straight leg raise:positive  Results for orders placed or performed during the hospital encounter of 03/25/17  CBC with Differential  Result Value Ref Range   WBC 6.2 3.8 - 10.6 K/uL   RBC 3.89 (L) 4.40 - 5.90 MIL/uL   Hemoglobin 12.6 (L) 13.0 - 18.0 g/dL   HCT 86.5 (L) 78.4 - 69.6 %   MCV 95.5 80.0 - 100.0 fL   MCH 32.5 26.0 - 34.0 pg   MCHC 34.0 32.0 - 36.0 g/dL   RDW 29.5 (H) 28.4 - 13.2 %   Platelets 126 (L) 150 - 440 K/uL   Neutrophils Relative % 67 %   Neutro Abs 4.2 1.4 - 6.5 K/uL   Lymphocytes Relative 18 %   Lymphs Abs 1.1 1.0 - 3.6 K/uL   Monocytes Relative 13 %   Monocytes Absolute 0.8 0.2 - 1.0 K/uL   Eosinophils Relative 1 %   Eosinophils Absolute 0.1 0 - 0.7 K/uL   Basophils Relative 1 %   Basophils Absolute 0.0 0 - 0.1 K/uL  Urinalysis, Complete w Microscopic  Result Value Ref Range   Color, Urine AMBER (A) YELLOW   APPearance CLEAR (A) CLEAR   Specific Gravity, Urine 1.019 1.005 - 1.030   pH 5.0 5.0 - 8.0   Glucose, UA NEGATIVE NEGATIVE mg/dL   Hgb urine dipstick NEGATIVE NEGATIVE   Bilirubin Urine NEGATIVE NEGATIVE   Ketones, ur 5 (A) NEGATIVE mg/dL   Protein, ur NEGATIVE NEGATIVE mg/dL   Nitrite NEGATIVE NEGATIVE   Leukocytes, UA NEGATIVE NEGATIVE   RBC / HPF 0-5 0 - 5 RBC/hpf   WBC, UA 0-5 0 - 5 WBC/hpf   Bacteria, UA NONE SEEN  NONE SEEN   Squamous Epithelial / LPF NONE SEEN NONE SEEN   Mucus PRESENT   Ethanol  Result Value Ref Range   Alcohol, Ethyl (B) 24 (H) <5 mg/dL  Urine Drug Screen, Qualitative ([facility] only)  Result Value Ref Range   Tricyclic, Ur Screen NONE DETECTED NONE DETECTED   Amphetamines, Ur Screen NONE DETECTED NONE DETECTED   MDMA (Ecstasy)Ur Screen NONE DETECTED NONE DETECTED   Cocaine Metabolite,Ur [place] NONE DETECTED NONE DETECTED   Opiate, Ur Screen NONE DETECTED NONE DETECTED   Phencyclidine (PCP) Ur S NONE DETECTED NONE DETECTED   Cannabinoid 50 Ng, Ur [place] NONE DETECTED NONE DETECTED   Barbiturates, Ur Screen NONE DETECTED NONE DETECTED   Benzodiazepine, Ur Scrn NONE DETECTED NONE DETECTED   Methadone Scn, Ur NONE DETECTED NONE DETECTED  Comprehensive metabolic panel  Result Value Ref Range   Sodium 136 135 - 145 mmol/L   Potassium 3.6 3.5 - 5.1 mmol/L   Chloride 103 101 - 111 mmol/L   CO2 25 22 - 32 mmol/L   Glucose, Bld 116 (H) 65 - 99 mg/dL   BUN 8 6 - 20 mg/dL   Creatinine, Ser 4.40 0.61 - 1.24 mg/dL   Calcium 8.2 (L) 8.9 - 10.3 mg/dL   Total Protein 6.9 6.5 - 8.1 g/dL   Albumin 3.1 (L) 3.5 - 5.0 g/dL   AST 85 (H) 15 - 41 U/L   ALT 53 17 - 63 U/L   Alkaline Phosphatase 60 38 - 126 U/L   Total Bilirubin 1.6 (H) 0.3 - 1.2 mg/dL   GFR calc non Af Amer >60 >60 mL/min   GFR calc Af Amer >60 >60 mL/min   Anion gap 8 5 - 15  Lipase, blood  Result Value Ref Range   Lipase 67 (H) 11 - 51 U/L      Assessment & Plan:   Problem List Items Addressed This Visit      Cardiovascular and Mediastinum   Esophageal varices without bleeding (HCC)    Had EGD done with Dr. Tobi Bastos. No concerns at this time. Checking CBC today.      Essential hypertension    Under good control. Possibly a bit low. Needs to be on beta blocker and lasix and spironalactone due to his liver function. Continue to monitor. Call with any concerns.       Relevant Orders   TSH   Microalbumin, Urine Waived      Digestive   Hepatitis C - Primary    Still drinking about 1 glass of wine a week. STRONGLY advised him to stop drinking all together so he can be treated. Did not get labs with GI in March. Will check those labs today- ordered. Will get him back in to see GI, appointment scheduled for 10/30. Continue current medication. Call with any concerns.       Relevant Orders   CBC with Differential/Platelet   Comprehensive metabolic panel   HCV RNA quant   Hepatitis B surface antibody   TSH   Iron and TIBC   HIV antibody   Hepatitis A Ab, Total   Hepatitis B Core Antibody, total   Hepatitis B Surface AntiGEN   Celiac Disease Panel   Immunoglobulins, QN, A/E/G/M   Ceruloplasmin   Alpha-1-Antitrypsin   Ferritin   Iron   ANA w/Reflex   Mitochondrial antibodies   Anti-smooth muscle antibody, IgG   INR/PT   AntiMicrosomal Ab-Liver / Kidney     Nervous and Auditory   Osteoarthritis of spine with radiculopathy, lumbar region    Chronic. 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 Has not had an MRI in 5 years, now with worsening numbness and tingling in his R leg, which had initially resolved after surgery. Concern for adjacent disc disease or failed back. Will obtain new MRI. Await results. Referral to neurosurgery made today.      Relevant Medications   gabapentin (NEURONTIN) 600 MG tablet   Other Relevant Orders   Ambulatory referral to Neurosurgery   MR Lumbar Spine Wo Contrast   Peripheral neuropathy    Checking labs today. Stable on gabapentin. Continue current regimen. Continue to monitor. Call with any concerns.       Relevant Medications   gabapentin (NEURONTIN) 600 MG tablet     Other   Ascites due to alcoholic cirrhosis (HCC)    Strongly encouraged patient to stop all drinking. Checking labs today. Will get him back into GI. Appointment scheduled today. Call with any concerns.       Relevant Orders   CBC with  Differential/Platelet   Comprehensive metabolic panel   HCV RNA quant   Hepatitis B surface antibody   TSH   Iron and TIBC   HIV antibody   Hepatitis A Ab, Total   Hepatitis B Core Antibody, total   Hepatitis B Surface AntiGEN   Celiac Disease Panel   Immunoglobulins, QN, A/E/G/M   Ceruloplasmin   Alpha-1-Antitrypsin   Ferritin   Iron   ANA w/Reflex   Mitochondrial antibodies   Anti-smooth muscle antibody, IgG   INR/PT   AntiMicrosomal Ab-Liver / Kidney    Other Visit Diagnoses    Myopia of both eyes  Referral to opthalmology made today.    Relevant Orders   Ambulatory referral to Ophthalmology   Bilateral hearing loss, unspecified hearing loss type       Would like to see ENT. Referral generated today. Has history of hearing aids.    Relevant Orders   Ambulatory referral to ENT   Weight loss       Likely fluid given pericentesises with significant fluid pulled off. Will check labs. Await results. Call with any concerns.    Relevant Orders   TSH   PSA   UA/M w/rflx Culture, Routine   Screening for cholesterol level       Labs drawn today. Await results.    Relevant Orders   Lipid Panel w/o Chol/HDL Ratio   Screening for prostate cancer       Labs drawn today. Await results.    Relevant Orders   PSA       Follow up plan: Return in about 4 weeks (around 08/27/2017) for Physical, records release PCP in [place] please.

## 2017-07-30 NOTE — Assessment & Plan Note (Signed)
Still drinking about 1 glass of wine a week. STRONGLY advised him to stop drinking all together so he can be treated. Did not get labs with GI in March. Will check those labs today- ordered. Will get him back in to see GI, appointment scheduled for 10/30. Continue current medication. Call with any concerns.

## 2017-07-30 NOTE — Patient Instructions (Signed)
Follow up with GI Dr. Tobi Bastos 08/20/17- 3:15PM  [facility]  [address]  [address]  [place], [place] [address]  [phone]

## 2017-07-30 NOTE — Assessment & Plan Note (Signed)
Checking labs today. Stable on gabapentin. Continue current regimen. Continue to monitor. Call with any concerns.

## 2017-08-02 LAB — COMPREHENSIVE METABOLIC PANEL
ALBUMIN: 4.1 g/dL (ref 3.6–4.8)
ALT: 90 IU/L — ABNORMAL HIGH (ref 0–44)
AST: 128 IU/L — ABNORMAL HIGH (ref 0–40)
Albumin/Globulin Ratio: 1.1 — ABNORMAL LOW (ref 1.2–2.2)
Alkaline Phosphatase: 65 IU/L (ref 39–117)
BILIRUBIN TOTAL: 1.3 mg/dL — AB (ref 0.0–1.2)
BUN / CREAT RATIO: 7 — AB (ref 10–24)
BUN: 7 mg/dL — AB (ref 8–27)
CO2: 20 mmol/L (ref 20–29)
Calcium: 9.4 mg/dL (ref 8.6–10.2)
Chloride: 98 mmol/L (ref 96–106)
Creatinine, Ser: 1.01 mg/dL (ref 0.76–1.27)
GFR, EST AFRICAN AMERICAN: 91 mL/min/{1.73_m2} (ref >59)
GFR, EST NON AFRICAN AMERICAN: 79 mL/min/{1.73_m2} (ref >59)
GLUCOSE: 101 mg/dL — AB (ref 65–99)
Globulin, Total: 3.7 g/dL (ref 1.5–4.5)
Potassium: 4.5 mmol/L (ref 3.5–5.2)
SODIUM: 133 mmol/L — AB (ref 134–144)
TOTAL PROTEIN: 7.8 g/dL (ref 6.0–8.5)

## 2017-08-02 LAB — CBC WITH DIFFERENTIAL/PLATELET
BASOS ABS: 0 10*3/uL (ref 0.0–0.2)
BASOS: 1 %
EOS (ABSOLUTE): 0.2 10*3/uL (ref 0.0–0.4)
Eos: 3 %
HEMOGLOBIN: 12.1 g/dL — AB (ref 13.0–17.7)
Hematocrit: 39.1 % (ref 37.5–51.0)
IMMATURE GRANS (ABS): 0 10*3/uL (ref 0.0–0.1)
Immature Granulocytes: 0 %
LYMPHS: 21 %
Lymphocytes Absolute: 1.1 10*3/uL (ref 0.7–3.1)
MCH: 26.4 pg — AB (ref 26.6–33.0)
MCHC: 30.9 g/dL — ABNORMAL LOW (ref 31.5–35.7)
MCV: 85 fL (ref 79–97)
Monocytes Absolute: 0.6 10*3/uL (ref 0.1–0.9)
Monocytes: 11 %
NEUTROS ABS: 3.4 10*3/uL (ref 1.4–7.0)
Neutrophils: 64 %
Platelets: 160 10*3/uL (ref 150–379)
RBC: 4.58 x10E6/uL (ref 4.14–5.80)
RDW: 17.5 % — ABNORMAL HIGH (ref 12.3–15.4)
WBC: 5.3 10*3/uL (ref 3.4–10.8)

## 2017-08-02 LAB — LIPID PANEL W/O CHOL/HDL RATIO
Cholesterol, Total: 154 mg/dL (ref 100–199)
HDL: 59 mg/dL (ref >39)
LDL Calculated: 82 mg/dL (ref 0–99)
Triglycerides: 66 mg/dL (ref 0–149)
VLDL Cholesterol Cal: 13 mg/dL (ref 5–40)

## 2017-08-02 LAB — CELIAC DISEASE PANEL
ENDOMYSIAL IGA: NEGATIVE
IgA/Immunoglobulin A, Serum: 703 mg/dL — ABNORMAL HIGH (ref 61–437)
Transglutaminase IgA: <2 U/mL (ref 0–3)

## 2017-08-02 LAB — IRON AND TIBC
IRON SATURATION: 8 % — AB (ref 15–55)
IRON: 42 ug/dL (ref 38–169)
Total Iron Binding Capacity: 510 ug/dL — ABNORMAL HIGH (ref 250–450)
UIBC: 468 ug/dL — AB (ref 111–343)

## 2017-08-02 LAB — ALPHA-1-ANTITRYPSIN: A1 ANTITRYPSIN: 167 mg/dL (ref 90–200)

## 2017-08-02 LAB — IMMUNOGLOBULINS A/E/G/M, SERUM
IGE (IMMUNOGLOBULIN E), SERUM: 31 [IU]/mL (ref 0–100)
IGG (IMMUNOGLOBIN G), SERUM: 1854 mg/dL — AB (ref 700–1600)
IGM (IMMUNOGLOBULIN M), SRM: 189 mg/dL — AB (ref 20–172)

## 2017-08-02 LAB — HEPATITIS B CORE ANTIBODY, TOTAL: Hep B Core Total Ab: POSITIVE — AB

## 2017-08-02 LAB — HEPATITIS B SURFACE ANTIBODY, QUANTITATIVE: HEPATITIS B SURF AB QUANT: 274.9 m[IU]/mL

## 2017-08-02 LAB — HCV RNA QUANT
HCV LOG10: 6.124 {Log_IU}/mL
Hepatitis C Quantitation: 1330000 IU/mL

## 2017-08-02 LAB — ANTI-MICROSOMAL ANTIBODY LIVER / KIDNEY: LKM1 Ab: 2.6 Units (ref 0.0–20.0)

## 2017-08-02 LAB — HEPATITIS B SURFACE ANTIGEN: Hepatitis B Surface Ag: NEGATIVE

## 2017-08-02 LAB — PSA: PROSTATE SPECIFIC AG, SERUM: 6 ng/mL — AB (ref 0.0–4.0)

## 2017-08-02 LAB — FERRITIN: FERRITIN: 23 ng/mL — AB (ref 30–400)

## 2017-08-02 LAB — TSH: TSH: 3.2 u[IU]/mL (ref 0.450–4.500)

## 2017-08-02 LAB — CERULOPLASMIN: Ceruloplasmin: 24.3 mg/dL (ref 16.0–31.0)

## 2017-08-02 LAB — ANTI-SMOOTH MUSCLE ANTIBODY, IGG: Smooth Muscle Ab: 32 Units — ABNORMAL HIGH (ref 0–19)

## 2017-08-02 LAB — HIV ANTIBODY (ROUTINE TESTING W REFLEX): HIV SCREEN 4TH GENERATION: NONREACTIVE

## 2017-08-02 LAB — PROTIME-INR
INR: 1.2 (ref 0.8–1.2)
Prothrombin Time: 12.3 s — ABNORMAL HIGH (ref 9.1–12.0)

## 2017-08-02 LAB — HEPATITIS A ANTIBODY, TOTAL: HEP A TOTAL AB: POSITIVE — AB

## 2017-08-02 LAB — MITOCHONDRIAL ANTIBODIES: Mitochondrial Ab: 4.5 Units (ref 0.0–20.0)

## 2017-08-02 LAB — ANA W/REFLEX: Anti Nuclear Antibody(ANA): NEGATIVE

## 2017-08-05 ENCOUNTER — Telehealth: Payer: Self-pay

## 2017-08-05 NOTE — Telephone Encounter (Signed)
Tried calling patient to notify him of his MRI appointment. No answer, LVM for patient to return call.

## 2017-08-09 ENCOUNTER — Telehealth: Payer: Self-pay | Admitting: Family Medicine

## 2017-08-09 NOTE — Telephone Encounter (Signed)
Patient called to check on status of referral due to it being denied due to medicaid not covering it. Informed pt. Will keep him updated.

## 2017-08-09 NOTE — Telephone Encounter (Signed)
Baird LyonsCasey [facility] called in regards to refferal not being accepted [facility]. In order for medicaid to approve provider needs to sign off saying that pt needs exam due to hypertention or hepatitis affecting his myopia in his eyes. Form also has pt. Previous doctor listed on it. Requesting to having pt.'s current provider listed on referral.   Phone: [phone] Fax: [phone]  Thank you

## 2017-08-09 NOTE — Telephone Encounter (Signed)
Patient must change PCP on Card before appointment can be scheduled for [facility]NT [facility]. Dx for Eye must also be more clinic such as: hypertension, CKD, etc

## 2017-08-12 ENCOUNTER — Ambulatory Visit: Payer: Medicaid Other

## 2017-08-12 ENCOUNTER — Telehealth: Payer: Self-pay | Admitting: Family Medicine

## 2017-08-12 NOTE — Telephone Encounter (Signed)
Copied from CRM #[id number]. Topic: Inquiry >> Aug 12, 2017  1:34 PM Eston Mouldavis, Cheri B wrote: Reason for CRM: pt was to have mri today, medicaid denied payment, he wants to know if he can reschedule that mri and if medicaid has agreed to pay for it. >> Aug 12, 2017  4:21 PM Kandice HamsSlade, Katina B wrote: Lorina RabonKeri,  Please see the message regarding pt's MRI.    Thanks >> Aug 12, 2017  4:30 PM Marshall CorkBullock, Keri L, [place] wrote: Dr. Laury DeepJohsnon,   Did you see patient's referral message is sent the morning regarding patient's denial for MRI?

## 2017-08-12 NOTE — Telephone Encounter (Signed)
Since they are refusing to pay for the MRI right now, let's have him see neurosurgery first and see what they recommend.

## 2017-08-12 NOTE — Telephone Encounter (Signed)
Tried calling patient. Left message for patient to return my call.   Patient has an appointment with Neurosurgery scheduled.

## 2017-08-13 NOTE — Telephone Encounter (Signed)
Copied from CRM [phone]. Topic: Quick Communication - Office Called Patient >> Aug 12, 2017  5:08 PM Everardo PacificMoton, Kelly, [place] wrote: Reason for CRM: Patient missed called from office returning the call >> Aug 13, 2017  8:19 AM Adela PortsWilliamson, Christan M wrote: Pt was returning a call.

## 2017-08-13 NOTE — Telephone Encounter (Signed)
There was already a previous CRM/Telephone encounter open regarding what I called patient for.   Dr. Laural BenesJohnson said to see Neurosurgery first and then see about an MRI.  Medicaid denied MRI. Spoke with patient and patient said his appointment was tomorrow, which is what was in our system.  Called Neurosurgery, patient canceled his appointment because of the denial. She asked to fax denial letter and last progress note again regarding patient's history of back and they'd take the next steps to rescheduling my appointment.   Lawerance BachGave Donna from Neurosurgery my direct phone line to call.   Routing to provider as an Financial plannerYI.

## 2017-08-14 NOTE — Telephone Encounter (Signed)
Patient was notified that the MRI was denied and Dr. Laural BenesJohnson said to not get an MRI and F/U with neurosurgery. Patient canceled his appointment with neurosurgery. I explained to patient to reschedule his neurosurgery appointment and I would contact Neurosurgery appointment. Patient understood. Neurosurgery updated with information.   The MRI was denied by medicaid, the facility doesn't matter.  Patient needs to see a Doctor with treatment for a prolonged period of time before an MRI, which is why it's so important to get him in to see Neurosurgery.   PEC or Triage Nurse ok to give information.

## 2017-08-14 NOTE — Telephone Encounter (Signed)
Keri- I don't know what to do with this. Can you please check to see if there is anything else needing to be done per previous TE/CRM. Thanks!

## 2017-08-14 NOTE — Telephone Encounter (Signed)
Copied from CRM [phone]. Topic: Referral - Question >> Aug 13, 2017  3:41 PM Antonio Ryan, [name] wrote: Patient called and said that the radiology department in [place] where he is having his MRI done need to talk to Dr. Laural BenesJohnson before approving it. Please call [facility] to # [phone]. Please call patient if any further questions, [phone].

## 2017-08-15 NOTE — Telephone Encounter (Signed)
KC reached out to patient to schedule.  Appointment with Neurosurgery 08/22/2017.

## 2017-08-20 ENCOUNTER — Ambulatory Visit: Payer: Self-pay | Admitting: Gastroenterology

## 2017-08-20 ENCOUNTER — Telehealth: Payer: Self-pay | Admitting: Family Medicine

## 2017-08-20 NOTE — Telephone Encounter (Signed)
Gabapentin is on the preferred fomulary for [place] medicaid. It is also on the [facility] $4 without insurance

## 2017-08-20 NOTE — Telephone Encounter (Signed)
Routing to provider  

## 2017-08-20 NOTE — Telephone Encounter (Signed)
Pt called said Medicaid wont pay for Gabapentin. Wants to know what different med that Medicaid will pay for

## 2017-08-20 NOTE — Telephone Encounter (Signed)
Copied from CRM #[id number]. Topic: Quick Communication - See Telephone Encounter >> Aug 20, 2017  2:10 PM Antonio Ryan, Antonio Ryan wrote: CRM for notification. See Telephone encounter for:  08/20/17. Patient called and said that medicaid did not want to pay for his gabapentin and he wants to know what can he have that his insurance will pay for? Please call patient back, thanks.

## 2017-08-20 NOTE — Telephone Encounter (Signed)
Patient notified

## 2017-08-28 ENCOUNTER — Other Ambulatory Visit: Payer: Self-pay | Admitting: Gastroenterology

## 2017-08-28 DIAGNOSIS — K7031 Alcoholic cirrhosis of liver with ascites: Secondary | ICD-10-CM

## 2017-08-29 ENCOUNTER — Encounter: Payer: Self-pay | Admitting: Family Medicine

## 2017-08-29 ENCOUNTER — Ambulatory Visit (INDEPENDENT_AMBULATORY_CARE_PROVIDER_SITE_OTHER): Payer: Medicaid Other | Admitting: Family Medicine

## 2017-08-29 DIAGNOSIS — G6289 Other specified polyneuropathies: Secondary | ICD-10-CM

## 2017-08-29 MED ORDER — GABAPENTIN 300 MG PO CAPS: 1800.0000 mg | ORAL_CAPSULE | Freq: Two times a day (BID) | ORAL | 6 refills | Status: DC

## 2017-08-29 MED ORDER — GABAPENTIN 600 MG PO TABS: 1800.0000 mg | ORAL_TABLET | Freq: Two times a day (BID) | ORAL | 6 refills | Status: DC

## 2017-08-29 MED ORDER — DULOXETINE HCL 20 MG PO CPEP: 20.0000 mg | ORAL_CAPSULE | Freq: Every day | ORAL | 3 refills | Status: DC

## 2017-08-29 NOTE — Progress Notes (Signed)
BP 114/69 (BP Location: Left Arm, Patient Position: Sitting, Cuff Size: Normal)   Pulse 64   Temp 98.5 F (36.9 C)   Wt 198 lb 5 oz (90 kg)   SpO2 100%   BMI 26.53 kg/m    Subjective:    Patient ID: Antonio BrownsDonald Ryan, male    DOB: 12/25/1955, 61 y.o.   MRN: [id number]  HPI: Antonio BrownsDonald Ryan is a 61 y.o. male  Chief Complaint  Patient presents with  . Peripheral Neuropathy   NEUROPATHY- not taking his gabapentin. Insurance didn't cover it, although it's on the preferred drug list Neuropathy status: uncontrolled  Satisfied with current treatment?: no Medication side effects: Not on anything currently Medication compliance:  Has been out for over a month Location: bilateral lower legs Pain: yes Severity: severe  Quality:  Sharp and aching Frequency: constant Bilateral: yes Symmetric: yes Numbness: yes Decreased sensation: yes Weakness: yes Context: worse  Relevant past medical, surgical, family and social history reviewed and updated as indicated. Interim medical history since our last visit reviewed. Allergies and medications reviewed and updated.  Review of Systems  Respiratory: Negative.   Cardiovascular: Negative.   Musculoskeletal: Positive for back pain and myalgias. Negative for arthralgias, gait problem, joint swelling, neck pain and neck stiffness.  Skin: Negative.   Neurological: Positive for weakness and numbness. Negative for dizziness, tremors, seizures, syncope, facial asymmetry, speech difficulty, light-headedness and headaches.  Psychiatric/Behavioral: Negative.     Per HPI unless specifically indicated above     Objective:    BP 114/69 (BP Location: Left Arm, Patient Position: Sitting, Cuff Size: Normal)   Pulse 64   Temp 98.5 F (36.9 C)   Wt 198 lb 5 oz (90 kg)   SpO2 100%   BMI 26.53 kg/m   Wt Readings from Last 3 Encounters:  08/29/17 198 lb 5 oz (90 kg)  07/30/17 188 lb 4 oz (85.4 kg)  03/25/17 206 lb (93.4 kg)    Physical Exam    Constitutional: He is oriented to person, place, and time. He appears well-developed and well-nourished. No distress.  HENT:  Head: Normocephalic and atraumatic.  Right Ear: Hearing normal.  Left Ear: Hearing normal.  Nose: Nose normal.  Eyes: Conjunctivae and lids are normal. Right eye exhibits no discharge. Left eye exhibits no discharge. No scleral icterus.  Cardiovascular: Normal rate, regular rhythm, normal heart sounds and intact distal pulses. Exam reveals no gallop and no friction rub.  No murmur heard. Pulmonary/Chest: Effort normal and breath sounds normal. No respiratory distress. He has no wheezes. He has no rales. He exhibits no tenderness.  Neurological: He is alert and oriented to person, place, and time.  Skin: Skin is warm, dry and intact. No rash noted. He is not diaphoretic. No erythema. No pallor.  Psychiatric: He has a normal mood and affect. His speech is normal and behavior is normal. Judgment and thought content normal. Cognition and memory are normal.  Nursing note and vitals reviewed.   Results for orders placed or performed in visit on 07/30/17  CBC with Differential/Platelet  Result Value Ref Range   WBC 5.3 3.4 - 10.8 x10E3/uL   RBC 4.58 4.14 - 5.80 x10E6/uL   Hemoglobin 12.1 (L) 13.0 - 17.7 g/dL   Hematocrit 40.939.1 81.137.5 - 51.0 %   MCV 85 79 - 97 fL   MCH 26.4 (L) 26.6 - 33.0 pg   MCHC 30.9 (L) 31.5 - 35.7 g/dL   RDW 91.417.5 (H) 78.212.3 - 95.615.4 %  Platelets 160 150 - 379 x10E3/uL   Neutrophils 64 Not Estab. %   Lymphs 21 Not Estab. %   Monocytes 11 Not Estab. %   Eos 3 Not Estab. %   Basos 1 Not Estab. %   Neutrophils Absolute 3.4 1.4 - 7.0 x10E3/uL   Lymphocytes Absolute 1.1 0.7 - 3.1 x10E3/uL   Monocytes Absolute 0.6 0.1 - 0.9 x10E3/uL   EOS (ABSOLUTE) 0.2 0.0 - 0.4 x10E3/uL   Basophils Absolute 0.0 0.0 - 0.2 x10E3/uL   Immature Granulocytes 0 Not Estab. %   Immature Grans (Abs) 0.0 0.0 - 0.1 x10E3/uL  Comprehensive metabolic panel  Result Value Ref  Range   Glucose 101 (H) 65 - 99 mg/dL   BUN 7 (L) 8 - 27 mg/dL   Creatinine, Ser 1.61 0.76 - 1.27 mg/dL   GFR calc non Af Amer 79 >59 mL/min/1.73   GFR calc Af Amer 91 >59 mL/min/1.73   BUN/Creatinine Ratio 7 (L) 10 - 24   Sodium 133 (L) 134 - 144 mmol/L   Potassium 4.5 3.5 - 5.2 mmol/L   Chloride 98 96 - 106 mmol/L   CO2 20 20 - 29 mmol/L   Calcium 9.4 8.6 - 10.2 mg/dL   Total Protein 7.8 6.0 - 8.5 g/dL   Albumin 4.1 3.6 - 4.8 g/dL   Globulin, Total 3.7 1.5 - 4.5 g/dL   Albumin/Globulin Ratio 1.1 (L) 1.2 - 2.2   Bilirubin Total 1.3 (H) 0.0 - 1.2 mg/dL   Alkaline Phosphatase 65 39 - 117 IU/L   AST 128 (H) 0 - 40 IU/L   ALT 90 (H) 0 - 44 IU/L  HCV RNA quant  Result Value Ref Range   Hepatitis C Quantitation 1,330,000 IU/mL   HCV log10 6.124 log10 IU/mL   Test Information Comment   Hepatitis B surface antibody  Result Value Ref Range   Hepatitis B Surf Ab Quant 274.9 Immunity>9.9 mIU/mL  TSH  Result Value Ref Range   TSH 3.200 0.450 - 4.500 uIU/mL  PSA  Result Value Ref Range   Prostate Specific Ag, Serum 6.0 (H) 0.0 - 4.0 ng/mL  Lipid Panel w/o Chol/HDL Ratio  Result Value Ref Range   Cholesterol, Total 154 100 - 199 mg/dL   Triglycerides 66 0 - 149 mg/dL   HDL 59 >09 mg/dL   VLDL Cholesterol Cal 13 5 - 40 mg/dL   LDL Calculated 82 0 - 99 mg/dL  Iron and TIBC  Result Value Ref Range   Total Iron Binding Capacity 510 (H) 250 - 450 ug/dL   UIBC 604 (H) 540 - 981 ug/dL   Iron 42 38 - 191 ug/dL   Iron Saturation 8 (LL) 15 - 55 %  HIV antibody  Result Value Ref Range   HIV Screen 4th Generation wRfx Non Reactive Non Reactive  Hepatitis A Ab, Total  Result Value Ref Range   Hep A Total Ab Positive (A) Negative  Hepatitis B Core Antibody, total  Result Value Ref Range   Hep B Core Total Ab Positive (A) Negative  Hepatitis B Surface AntiGEN  Result Value Ref Range   Hepatitis B Surface Ag Negative Negative  Celiac Disease Panel  Result Value Ref Range   Endomysial  IgA Negative Negative   Transglutaminase IgA <2 0 - 3 U/mL   IgA/Immunoglobulin A, Serum 703 (H) 61 - 437 mg/dL  Immunoglobulins, QN, A/E/G/M  Result Value Ref Range   IgG (Immunoglobin G), Serum 1,854 (H) 700 - 1,600 mg/dL  IgM (Immunoglobulin M), Srm 189 (H) 20 - 172 mg/dL   IgE (Immunoglobulin E), Serum 31 0 - 100 IU/mL  Ceruloplasmin  Result Value Ref Range   Ceruloplasmin 24.3 16.0 - 31.0 mg/dL  [id number]-1-Antitrypsin  Result Value Ref Range   A-1 Antitrypsin 167 90 - 200 mg/dL  Ferritin  Result Value Ref Range   Ferritin 23 (L) 30 - 400 ng/mL  ANA w/Reflex  Result Value Ref Range   Anit Nuclear Antibody(ANA) Negative Negative  Mitochondrial antibodies  Result Value Ref Range   Mitochondrial Ab 4.5 0.0 - 20.0 Units  Anti-smooth muscle antibody, IgG  Result Value Ref Range   Smooth Muscle Ab 32 (H) 0 - 19 Units  INR/PT  Result Value Ref Range   INR 1.2 0.8 - 1.2   Prothrombin Time 12.3 (H) 9.1 - 12.0 sec  AntiMicrosomal Ab-Liver / Kidney  Result Value Ref Range   LKM1 Ab 2.6 0.0 - 20.0 Units      Assessment & Plan:   Problem List Items Addressed This Visit      Nervous and Auditory   Peripheral neuropathy    Call made to his pharmacy and figured out.  Will restart gabapentin. Will also start cymbalta. Recheck 1 month. Call with any concerns.       Relevant Medications   DULoxetine (CYMBALTA) 20 MG capsule   gabapentin (NEURONTIN) 300 MG capsule       Follow up plan: Return in about 4 weeks (around 09/26/2017) for follow up neuropathy.

## 2017-08-29 NOTE — Assessment & Plan Note (Signed)
Call made to his pharmacy and figured out.  Will restart gabapentin. Will also start cymbalta. Recheck 1 month. Call with any concerns.

## 2017-09-04 ENCOUNTER — Ambulatory Visit: Payer: Self-pay | Admitting: Gastroenterology

## 2017-09-04 ENCOUNTER — Encounter: Payer: Self-pay | Admitting: Gastroenterology

## 2017-09-27 ENCOUNTER — Ambulatory Visit (INDEPENDENT_AMBULATORY_CARE_PROVIDER_SITE_OTHER): Payer: Medicaid Other | Admitting: Family Medicine

## 2017-09-27 ENCOUNTER — Encounter: Payer: Self-pay | Admitting: Family Medicine

## 2017-09-27 VITALS — BP 99/62 | HR 68 | Wt 208.0 lb

## 2017-09-27 DIAGNOSIS — B370 Candidal stomatitis: Secondary | ICD-10-CM | POA: Diagnosis not present

## 2017-09-27 DIAGNOSIS — G6289 Other specified polyneuropathies: Secondary | ICD-10-CM | POA: Diagnosis not present

## 2017-09-27 MED ORDER — FIRST-DUKES MOUTHWASH MT SUSP: 5.0000 mL | Freq: Four times a day (QID) | OROMUCOSAL | 0 refills | Status: DC

## 2017-09-27 MED ORDER — GABAPENTIN 300 MG PO CAPS: 1800.0000 mg | ORAL_CAPSULE | Freq: Two times a day (BID) | ORAL | 1 refills | Status: DC

## 2017-09-27 MED ORDER — DULOXETINE HCL 20 MG PO CPEP: 20.0000 mg | ORAL_CAPSULE | Freq: Every day | ORAL | 1 refills | Status: DC

## 2017-09-27 NOTE — Assessment & Plan Note (Signed)
Doing great on his gabapentin and cymbalta. Continue current regimen. Continue to monitor. Refills given. Call with any concerns.

## 2017-09-27 NOTE — Progress Notes (Signed)
BP 99/62 (BP Location: Left Arm, Patient Position: Sitting, Cuff Size: Normal)   Pulse 68   Wt 208 lb (94.3 kg)   SpO2 97%   BMI 27.82 kg/m    Subjective:    Patient ID: Antonio Ryan, male    DOB: 04/14/1956, 61 y.o.   MRN: [id number]  HPI: Antonio Ryan is a 61 y.o. male  Chief Complaint  Patient presents with  . Peripheral Neuropathy  . URI   NEUROPATHY- started on gabapentin and cymbalta, doing great! Feet feel wonderful! No side effects.   UPPER RESPIRATORY TRACT INFECTION Duration: 1 week Worst symptom: sore throat Fever: no Cough: yes Shortness of breath: no Wheezing: no Chest pain: no Chest tightness: no Chest congestion: no Nasal congestion: no Runny nose: yes Post nasal drip: no Sneezing: yes Sore throat: yes Swollen glands: no Sinus pressure: no Headache: no Face pain: no Toothache: no Ear pain: yes left Ear pressure: no  Eyes red/itching:no Eye drainage/crusting: no  Vomiting: no Rash: no Fatigue: no Sick contacts: no Strep contacts: no  Context: worse Recurrent sinusitis: no Relief with OTC cold/cough medications: no  Treatments attempted: none   Relevant past medical, surgical, family and social history reviewed and updated as indicated. Interim medical history since our last visit reviewed. Allergies and medications reviewed and updated.  Review of Systems  Constitutional: Negative.   HENT: Positive for ear pain and sore throat. Negative for congestion, dental problem, drooling, ear discharge, facial swelling, hearing loss, mouth sores, nosebleeds, postnasal drip, rhinorrhea, sinus pressure, sinus pain, sneezing, tinnitus, trouble swallowing and voice change.   Respiratory: Negative.   Cardiovascular: Negative.   Psychiatric/Behavioral: Negative.     Per HPI unless specifically indicated above     Objective:    BP 99/62 (BP Location: Left Arm, Patient Position: Sitting, Cuff Size: Normal)   Pulse 68   Wt 208 lb (94.3 kg)    SpO2 97%   BMI 27.82 kg/m   Wt Readings from Last 3 Encounters:  09/27/17 208 lb (94.3 kg)  08/29/17 198 lb 5 oz (90 kg)  07/30/17 188 lb 4 oz (85.4 kg)    Physical Exam  Constitutional: He is oriented to person, place, and time. He appears well-developed and well-nourished. No distress.  HENT:  Head: Normocephalic and atraumatic.  Right Ear: Hearing and external ear normal.  Left Ear: Hearing and external ear normal.  Nose: Nose normal.  Mouth/Throat: No oropharyngeal exudate.  Thrush on tongue and posterior pharynx  Eyes: Conjunctivae, EOM and lids are normal. Pupils are equal, round, and reactive to light. Right eye exhibits no discharge. Left eye exhibits no discharge. No scleral icterus.  Neck: Normal range of motion. Neck supple. No JVD present. No tracheal deviation present. No thyromegaly present.  Cardiovascular: Normal rate, regular rhythm, normal heart sounds and intact distal pulses. Exam reveals no gallop and no friction rub.  No murmur heard. Pulmonary/Chest: Effort normal and breath sounds normal. No stridor. No respiratory distress. He has no wheezes. He has no rales. He exhibits no tenderness.  Musculoskeletal: Normal range of motion.  Lymphadenopathy:    He has no cervical adenopathy.  Neurological: He is alert and oriented to person, place, and time.  Skin: Skin is warm, dry and intact. No rash noted. He is not diaphoretic. No erythema. No pallor.  Psychiatric: He has a normal mood and affect. His speech is normal and behavior is normal. Judgment and thought content normal. Cognition and memory are normal.  Nursing note and  vitals reviewed.   Results for orders placed or performed in visit on 07/30/17  CBC with Differential/Platelet  Result Value Ref Range   WBC 5.3 3.4 - 10.8 x10E3/uL   RBC 4.58 4.14 - 5.80 x10E6/uL   Hemoglobin 12.1 (L) 13.0 - 17.7 g/dL   Hematocrit 16.1 09.6 - 51.0 %   MCV 85 79 - 97 fL   MCH 26.4 (L) 26.6 - 33.0 pg   MCHC 30.9 (L) 31.5  - 35.7 g/dL   RDW 04.5 (H) 40.9 - 81.1 %   Platelets 160 150 - 379 x10E3/uL   Neutrophils 64 Not Estab. %   Lymphs 21 Not Estab. %   Monocytes 11 Not Estab. %   Eos 3 Not Estab. %   Basos 1 Not Estab. %   Neutrophils Absolute 3.4 1.4 - 7.0 x10E3/uL   Lymphocytes Absolute 1.1 0.7 - 3.1 x10E3/uL   Monocytes Absolute 0.6 0.1 - 0.9 x10E3/uL   EOS (ABSOLUTE) 0.2 0.0 - 0.4 x10E3/uL   Basophils Absolute 0.0 0.0 - 0.2 x10E3/uL   Immature Granulocytes 0 Not Estab. %   Immature Grans (Abs) 0.0 0.0 - 0.1 x10E3/uL  Comprehensive metabolic panel  Result Value Ref Range   Glucose 101 (H) 65 - 99 mg/dL   BUN 7 (L) 8 - 27 mg/dL   Creatinine, Ser 9.14 0.76 - 1.27 mg/dL   GFR calc non Af Amer 79 >59 mL/min/1.73   GFR calc Af Amer 91 >59 mL/min/1.73   BUN/Creatinine Ratio 7 (L) 10 - 24   Sodium 133 (L) 134 - 144 mmol/L   Potassium 4.5 3.5 - 5.2 mmol/L   Chloride 98 96 - 106 mmol/L   CO2 20 20 - 29 mmol/L   Calcium 9.4 8.6 - 10.2 mg/dL   Total Protein 7.8 6.0 - 8.5 g/dL   Albumin 4.1 3.6 - 4.8 g/dL   Globulin, Total 3.7 1.5 - 4.5 g/dL   Albumin/Globulin Ratio 1.1 (L) 1.2 - 2.2   Bilirubin Total 1.3 (H) 0.0 - 1.2 mg/dL   Alkaline Phosphatase 65 39 - 117 IU/L   AST 128 (H) 0 - 40 IU/L   ALT 90 (H) 0 - 44 IU/L  HCV RNA quant  Result Value Ref Range   Hepatitis C Quantitation 1,330,000 IU/mL   HCV log10 6.124 log10 IU/mL   Test Information Comment   Hepatitis B surface antibody  Result Value Ref Range   Hepatitis B Surf Ab Quant 274.9 Immunity>9.9 mIU/mL  TSH  Result Value Ref Range   TSH 3.200 0.450 - 4.500 uIU/mL  PSA  Result Value Ref Range   Prostate Specific Ag, Serum 6.0 (H) 0.0 - 4.0 ng/mL  Lipid Panel w/o Chol/HDL Ratio  Result Value Ref Range   Cholesterol, Total 154 100 - 199 mg/dL   Triglycerides 66 0 - 149 mg/dL   HDL 59 >78 mg/dL   VLDL Cholesterol Cal 13 5 - 40 mg/dL   LDL Calculated 82 0 - 99 mg/dL  Iron and TIBC  Result Value Ref Range   Total Iron Binding Capacity  510 (H) 250 - 450 ug/dL   UIBC 295 (H) 621 - 308 ug/dL   Iron 42 38 - 657 ug/dL   Iron Saturation 8 (LL) 15 - 55 %  HIV antibody  Result Value Ref Range   HIV Screen 4th Generation wRfx Non Reactive Non Reactive  Hepatitis A Ab, Total  Result Value Ref Range   Hep A Total Ab Positive (A) Negative  Hepatitis  B Core Antibody, total  Result Value Ref Range   Hep B Core Total Ab Positive (A) Negative  Hepatitis B Surface AntiGEN  Result Value Ref Range   Hepatitis B Surface Ag Negative Negative  Celiac Disease Panel  Result Value Ref Range   Endomysial IgA Negative Negative   Transglutaminase IgA <2 0 - 3 U/mL   IgA/Immunoglobulin A, Serum 703 (H) 61 - 437 mg/dL  Immunoglobulins, QN, A/E/G/M  Result Value Ref Range   IgG (Immunoglobin G), Serum 1,854 (H) 700 - 1,600 mg/dL   IgM (Immunoglobulin M), Srm 189 (H) 20 - 172 mg/dL   IgE (Immunoglobulin E), Serum 31 0 - 100 IU/mL  Ceruloplasmin  Result Value Ref Range   Ceruloplasmin 24.3 16.0 - 31.0 mg/dL  [id number]-1-Antitrypsin  Result Value Ref Range   A-1 Antitrypsin 167 90 - 200 mg/dL  Ferritin  Result Value Ref Range   Ferritin 23 (L) 30 - 400 ng/mL  ANA w/Reflex  Result Value Ref Range   Anit Nuclear Antibody(ANA) Negative Negative  Mitochondrial antibodies  Result Value Ref Range   Mitochondrial Ab 4.5 0.0 - 20.0 Units  Anti-smooth muscle antibody, IgG  Result Value Ref Range   Smooth Muscle Ab 32 (H) 0 - 19 Units  INR/PT  Result Value Ref Range   INR 1.2 0.8 - 1.2   Prothrombin Time 12.3 (H) 9.1 - 12.0 sec  AntiMicrosomal Ab-Liver / Kidney  Result Value Ref Range   LKM1 Ab 2.6 0.0 - 20.0 Units      Assessment & Plan:   Problem List Items Addressed This Visit      Nervous and Auditory   Peripheral neuropathy - Primary    Doing great on his gabapentin and cymbalta. Continue current regimen. Continue to monitor. Refills given. Call with any concerns.       Relevant Medications   DULoxetine (CYMBALTA) 20 MG  capsule   gabapentin (NEURONTIN) 300 MG capsule    Other Visit Diagnoses    Thrush       Will treat with magic mouth wash. Call with any concerns.    Relevant Medications   Diphenhyd-Hydrocort-Nystatin (FIRST-DUKES MOUTHWASH) SUSP       Follow up plan: Return in about 4 months (around 01/26/2018) for 6 month follow up.

## 2017-10-04 ENCOUNTER — Encounter: Payer: Self-pay | Admitting: *Deleted

## 2017-10-04 ENCOUNTER — Telehealth: Payer: Self-pay | Admitting: Family Medicine

## 2017-10-04 NOTE — Telephone Encounter (Signed)
They can make it however they usually make it as long as it has nystatin in it. If they need how I usually like it it's 1 part benadryl, 1 part nystatin, 1 part maalox, 1 part 1% viscous lidocaine swish and spit 5mL 3x a day . Thanks!

## 2017-10-04 NOTE — Telephone Encounter (Signed)
Routing to provider  

## 2017-10-04 NOTE — Telephone Encounter (Signed)
This encounter was created in error - please disregard.

## 2017-10-04 NOTE — Telephone Encounter (Signed)
Copied from CRM [phone]#[id number]. Topic: Quick Communication - See Telephone Encounter >> Oct 04, 2017 10:28 AM Cipriano BunkerLambe, Annette S wrote: CRM for notification. See Tele[facility] encounter [facility]  [facility] [phone] Magic Mouth Wash asking for recipe,  if doctor has specific things wants in it.   10/04/17.

## 2017-10-04 NOTE — Telephone Encounter (Signed)
Pharmacy notified.

## 2017-10-04 NOTE — Telephone Encounter (Signed)
Pt called, no answer left message for pt to call back regarding Magic Mouthwash.

## 2017-10-07 ENCOUNTER — Other Ambulatory Visit: Payer: Self-pay | Admitting: Family Medicine

## 2017-10-07 DIAGNOSIS — I8510 Secondary esophageal varices without bleeding: Secondary | ICD-10-CM

## 2017-10-07 DIAGNOSIS — K7031 Alcoholic cirrhosis of liver with ascites: Secondary | ICD-10-CM

## 2017-10-16 ENCOUNTER — Telehealth: Payer: Self-pay | Admitting: Family Medicine

## 2017-10-16 NOTE — Telephone Encounter (Signed)
Copied from CRM [phone]. Topic: Quick Communication - See Telephone Encounter >> Oct 16, 2017  4:07 PM Trula SladeWalter, Linda F wrote: CRM for notification. See Telephone encounter for:  10/16/17. Patient would like a refill on his DULoxetine (CYMBALTA) 20 MG capsule Medication.

## 2017-10-17 NOTE — Telephone Encounter (Signed)
RX was sent in 09/27/17 for #90 with 1 refill. Will call pharmacy when they open to confirm that they got this prescription.

## 2017-10-17 NOTE — Telephone Encounter (Signed)
Pharmacy had RX on file, they are getting it ready now for the patient. Patient notified that the RX would be ready shortly.

## 2017-10-25 ENCOUNTER — Other Ambulatory Visit: Payer: Self-pay

## 2017-10-25 DIAGNOSIS — R509 Fever, unspecified: Secondary | ICD-10-CM | POA: Insufficient documentation

## 2017-10-25 DIAGNOSIS — Z5321 Procedure and treatment not carried out due to patient leaving prior to being seen by health care provider: Secondary | ICD-10-CM | POA: Diagnosis not present

## 2017-10-25 DIAGNOSIS — R110 Nausea: Secondary | ICD-10-CM | POA: Insufficient documentation

## 2017-10-25 DIAGNOSIS — F1099 Alcohol use, unspecified with unspecified alcohol-induced disorder: Secondary | ICD-10-CM | POA: Insufficient documentation

## 2017-10-25 NOTE — ED Notes (Signed)
[facility], [facility] nausea [facility] 1 day, reports has been drinking ETOH today.  VS:  t 100.2; hr 82; bp 138/79; pulse oxi 98% on room air; cbg 119.

## 2017-10-25 NOTE — ED Triage Notes (Signed)
Reports having fever and chills today.  Hx of cirrhosis.

## 2017-10-26 ENCOUNTER — Emergency Department
Admission: EM | Admit: 2017-10-26 | Discharge: 2017-10-26 | Disposition: A | Discharge status: Home / Self Care | Payer: Medicaid Other | Attending: Emergency Medicine | Admitting: Emergency Medicine

## 2017-10-27 ENCOUNTER — Other Ambulatory Visit: Payer: Self-pay | Admitting: Gastroenterology

## 2017-11-18 ENCOUNTER — Other Ambulatory Visit: Payer: Self-pay | Admitting: Gastroenterology

## 2017-11-22 ENCOUNTER — Other Ambulatory Visit: Payer: Self-pay | Admitting: Gastroenterology

## 2017-11-22 ENCOUNTER — Encounter: Payer: Self-pay | Admitting: Family Medicine

## 2017-11-22 ENCOUNTER — Ambulatory Visit: Payer: Medicaid Other | Admitting: Family Medicine

## 2017-11-22 VITALS — BP 132/73 | HR 65 | Temp 97.9°F | Wt 210.0 lb

## 2017-11-22 DIAGNOSIS — Z77120 Contact with and (suspected) exposure to mold (toxic): Secondary | ICD-10-CM | POA: Diagnosis not present

## 2017-11-22 DIAGNOSIS — M7712 Lateral epicondylitis, left elbow: Secondary | ICD-10-CM

## 2017-11-22 MED ORDER — MONTELUKAST SODIUM 10 MG PO TABS: 10.0000 mg | ORAL_TABLET | Freq: Every day | ORAL | 3 refills | Status: AC

## 2017-11-22 MED ORDER — CETIRIZINE HCL 10 MG PO TABS: 10.0000 mg | ORAL_TABLET | Freq: Every day | ORAL | 11 refills | Status: DC

## 2017-11-22 NOTE — Progress Notes (Signed)
BP 132/73 (BP Location: Left Arm, Patient Position: Sitting, Cuff Size: Normal)   Pulse 65   Temp 97.9 F (36.6 C)   Wt 210 lb (95.3 kg) Comment: Patient reported  SpO2 100%   BMI 28.48 kg/m    Subjective:    Patient ID: Antonio Ryan, male    DOB: 1955/11/25, 62 y.o.   MRN: [id number]  HPI: Antonio Ryan is a 62 y.o. male  Chief Complaint  Patient presents with  . URI    X 3 weeks, patient states that he had to close off his bedroom and bathroom.    UPPER RESPIRATORY TRACT INFECTION- has a whole lot of mold in his bedroom and his bathroom, has been talking to his landlord and  Duration: 3 weeks Worst symptom: sore throat and ear pain Fever: no Cough: no Shortness of breath: no Wheezing: no Chest pain: yes, with cough Chest tightness: no Chest congestion: yes Nasal congestion: yes Runny nose: yes Post nasal drip: yes Sneezing: yes Sore throat: yes Swollen glands: no Sinus pressure: no Headache: yes Face pain: no Toothache: no Ear pain: yes bilateral Ear pressure: yes bilateral Eyes red/itching:no Eye drainage/crusting: no  Vomiting: yes Rash: no Fatigue: no Sick contacts: no Strep contacts: no  Context: stable Recurrent sinusitis: no Relief with OTC cold/cough medications: no  Treatments attempted: none   Has been having a lot of pain from tennis elbow. Has had it for years. Usually gets a shot and it goes away. Hasn't had a shot in a couple of years. Pain in L elbow shoots down into his arm. Worse with movement, better with ice and rest. No other concerns or complaints at this time.   Relevant past medical, surgical, family and social history reviewed and updated as indicated. Interim medical history since our last visit reviewed. Allergies and medications reviewed and updated.  Review of Systems  Constitutional: Negative.   HENT: Positive for congestion, postnasal drip, rhinorrhea, sinus pressure, sinus pain, sneezing and sore throat. Negative  for dental problem, drooling, ear discharge, ear pain, facial swelling, hearing loss, mouth sores, nosebleeds, tinnitus, trouble swallowing and voice change.   Eyes: Negative.   Respiratory: Negative.   Cardiovascular: Negative.   Musculoskeletal: Positive for arthralgias. Negative for back pain, gait problem, joint swelling, myalgias, neck pain and neck stiffness.  Skin: Negative.   Allergic/Immunologic: Positive for environmental allergies.  Neurological: Negative.   Psychiatric/Behavioral: Negative.     Per HPI unless specifically indicated above     Objective:    BP 132/73 (BP Location: Left Arm, Patient Position: Sitting, Cuff Size: Normal)   Pulse 65   Temp 97.9 F (36.6 C)   Wt 210 lb (95.3 kg) Comment: Patient reported  SpO2 100%   BMI 28.48 kg/m   Wt Readings from Last 3 Encounters:  11/22/17 210 lb (95.3 kg)  10/25/17 200 lb (90.7 kg)  09/27/17 208 lb (94.3 kg)    Physical Exam  Constitutional: He is oriented to person, place, and time. He appears well-developed and well-nourished. No distress.  HENT:  Head: Normocephalic and atraumatic.  Right Ear: Hearing and external ear normal.  Left Ear: Hearing and external ear normal.  Nose: Nose normal.  Mouth/Throat: Oropharynx is clear and moist. No oropharyngeal exudate.  Eyes: Conjunctivae, EOM and lids are normal. Pupils are equal, round, and reactive to light. Right eye exhibits no discharge. Left eye exhibits no discharge. No scleral icterus.  Neck: Normal range of motion. Neck supple. No JVD present. No tracheal  deviation present. No thyromegaly present.  Cardiovascular: Normal rate, regular rhythm, normal heart sounds and intact distal pulses. Exam reveals no gallop and no friction rub.  No murmur heard. Pulmonary/Chest: Effort normal and breath sounds normal. No stridor. No respiratory distress. He has no wheezes. He has no rales. He exhibits no tenderness.  Musculoskeletal: Edema: lateral epicondyle on the L.    Lymphadenopathy:    He has no cervical adenopathy.  Neurological: He is alert and oriented to person, place, and time.  Skin: Skin is warm, dry and intact. No rash noted. He is not diaphoretic. No erythema. No pallor.  Psychiatric: He has a normal mood and affect. His speech is normal and behavior is normal. Judgment and thought content normal. Cognition and memory are normal.    Results for orders placed or performed in visit on 07/30/17  CBC with Differential/Platelet  Result Value Ref Range   WBC 5.3 3.4 - 10.8 x10E3/uL   RBC 4.58 4.14 - 5.80 x10E6/uL   Hemoglobin 12.1 (L) 13.0 - 17.7 g/dL   Hematocrit 16.139.1 09.637.5 - 51.0 %   MCV 85 79 - 97 fL   MCH 26.4 (L) 26.6 - 33.0 pg   MCHC 30.9 (L) 31.5 - 35.7 g/dL   RDW 04.517.5 (H) 40.912.3 - 81.115.4 %   Platelets 160 150 - 379 x10E3/uL   Neutrophils 64 Not Estab. %   Lymphs 21 Not Estab. %   Monocytes 11 Not Estab. %   Eos 3 Not Estab. %   Basos 1 Not Estab. %   Neutrophils Absolute 3.4 1.4 - 7.0 x10E3/uL   Lymphocytes Absolute 1.1 0.7 - 3.1 x10E3/uL   Monocytes Absolute 0.6 0.1 - 0.9 x10E3/uL   EOS (ABSOLUTE) 0.2 0.0 - 0.4 x10E3/uL   Basophils Absolute 0.0 0.0 - 0.2 x10E3/uL   Immature Granulocytes 0 Not Estab. %   Immature Grans (Abs) 0.0 0.0 - 0.1 x10E3/uL  Comprehensive metabolic panel  Result Value Ref Range   Glucose 101 (H) 65 - 99 mg/dL   BUN 7 (L) 8 - 27 mg/dL   Creatinine, Ser 9.141.01 0.76 - 1.27 mg/dL   GFR calc non Af Amer 79 >59 mL/min/1.73   GFR calc Af Amer 91 >59 mL/min/1.73   BUN/Creatinine Ratio 7 (L) 10 - 24   Sodium 133 (L) 134 - 144 mmol/L   Potassium 4.5 3.5 - 5.2 mmol/L   Chloride 98 96 - 106 mmol/L   CO2 20 20 - 29 mmol/L   Calcium 9.4 8.6 - 10.2 mg/dL   Total Protein 7.8 6.0 - 8.5 g/dL   Albumin 4.1 3.6 - 4.8 g/dL   Globulin, Total 3.7 1.5 - 4.5 g/dL   Albumin/Globulin Ratio 1.1 (L) 1.2 - 2.2   Bilirubin Total 1.3 (H) 0.0 - 1.2 mg/dL   Alkaline Phosphatase 65 39 - 117 IU/L   AST 128 (H) 0 - 40 IU/L   ALT 90  (H) 0 - 44 IU/L  HCV RNA quant  Result Value Ref Range   Hepatitis C Quantitation 1,330,000 IU/mL   HCV log10 6.124 log10 IU/mL   Test Information Comment   Hepatitis B surface antibody  Result Value Ref Range   Hepatitis B Surf Ab Quant 274.9 Immunity>9.9 mIU/mL  TSH  Result Value Ref Range   TSH 3.200 0.450 - 4.500 uIU/mL  PSA  Result Value Ref Range   Prostate Specific Ag, Serum 6.0 (H) 0.0 - 4.0 ng/mL  Lipid Panel w/o Chol/HDL Ratio  Result Value Ref Range  Cholesterol, Total 154 100 - 199 mg/dL   Triglycerides 66 0 - 149 mg/dL   HDL 59 >16 mg/dL   VLDL Cholesterol Cal 13 5 - 40 mg/dL   LDL Calculated 82 0 - 99 mg/dL  Iron and TIBC  Result Value Ref Range   Total Iron Binding Capacity 510 (H) 250 - 450 ug/dL   UIBC 109 (H) 604 - 540 ug/dL   Iron 42 38 - 981 ug/dL   Iron Saturation 8 (LL) 15 - 55 %  HIV antibody  Result Value Ref Range   HIV Screen 4th Generation wRfx Non Reactive Non Reactive  Hepatitis A Ab, Total  Result Value Ref Range   Hep A Total Ab Positive (A) Negative  Hepatitis B Core Antibody, total  Result Value Ref Range   Hep B Core Total Ab Positive (A) Negative  Hepatitis B Surface AntiGEN  Result Value Ref Range   Hepatitis B Surface Ag Negative Negative  Celiac Disease Panel  Result Value Ref Range   Endomysial IgA Negative Negative   Transglutaminase IgA <2 0 - 3 U/mL   IgA/Immunoglobulin A, Serum 703 (H) 61 - 437 mg/dL  Immunoglobulins, QN, A/E/G/M  Result Value Ref Range   IgG (Immunoglobin G), Serum 1,854 (H) 700 - 1,600 mg/dL   IgM (Immunoglobulin M), Srm 189 (H) 20 - 172 mg/dL   IgE (Immunoglobulin E), Serum 31 0 - 100 IU/mL  Ceruloplasmin  Result Value Ref Range   Ceruloplasmin 24.3 16.0 - 31.0 mg/dL  [id number]  Result Value Ref Range   A-1 Antitrypsin 167 90 - 200 mg/dL  Ferritin  Result Value Ref Range   Ferritin 23 (L) 30 - 400 ng/mL  ANA w/Reflex  Result Value Ref Range   Anit Nuclear Antibody(ANA) Negative  Negative  Mitochondrial antibodies  Result Value Ref Range   Mitochondrial Ab 4.5 0.0 - 20.0 Units  Anti-smooth muscle antibody, IgG  Result Value Ref Range   Smooth Muscle Ab 32 (H) 0 - 19 Units  INR/PT  Result Value Ref Range   INR 1.2 0.8 - 1.2   Prothrombin Time 12.3 (H) 9.1 - 12.0 sec  AntiMicrosomal Ab-Liver / Kidney  Result Value Ref Range   LKM1 Ab 2.6 0.0 - 20.0 Units      Assessment & Plan:   Problem List Items Addressed This Visit    None    Visit Diagnoses    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 Will treat with zyrtec as singulair denied. Referral to [facility] made today. Call with any concerns.    Relevant Orders   Ambulatory referral to [facility]   Lateral epicondylitis of left elbow       Referral to ortho made today.   Relevant Orders   Ambulatory referral to Orthopedic Surgery       Follow up plan: Return if symptoms worsen or fail to improve.

## 2017-11-27 ENCOUNTER — Telehealth: Payer: Self-pay | Admitting: Family Medicine

## 2017-11-27 NOTE — Telephone Encounter (Signed)
Copied from CRM [phone]. Topic: Quick Communication - See Telephone Encounter >> Nov 27, 2017  1:42 PM Arlyss Gandyichardson, Timiko Offutt N, NT wrote: CRM for notification. See Telephone encounter for: Pt calling and states that he saw Dr. 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 He wanting an update to see if she has contacted them to come out? Please call pt  11/27/17.

## 2017-11-28 NOTE — Telephone Encounter (Signed)
Patient notified

## 2017-11-28 NOTE — Telephone Encounter (Signed)
I've put in a referral to [facility] for him, not sure when they'll be able to reach out to him

## 2017-12-22 ENCOUNTER — Other Ambulatory Visit: Payer: Self-pay | Admitting: Gastroenterology

## 2018-01-18 ENCOUNTER — Other Ambulatory Visit: Payer: Self-pay | Admitting: Gastroenterology

## 2018-01-20 ENCOUNTER — Telehealth: Payer: Self-pay | Admitting: Family Medicine

## 2018-01-20 MED ORDER — PANTOPRAZOLE SODIUM 40 MG PO TBEC: 40.0000 mg | DELAYED_RELEASE_TABLET | Freq: Two times a day (BID) | ORAL | 1 refills | Status: AC

## 2018-01-20 NOTE — Telephone Encounter (Signed)
Copied from CRM [phone]. Topic: Quick Communication - Rx Refill/Question >> Jan 20, 2018 12:48 PM Rudi CocoLathan, Lynden Carrithers M, NT wrote: Medication: pantoprazole (PROTONIX) 40 MG tablet [[id number]][[id number]]  Has the patient contacted their pharmacy?yes (Agent: If no, request that the patient contact the pharmacy for the refill.) Preferred Pharmacy (with phone number or street name): [phone][id number]- [place] - [address] [place] [id number] Phone: [phone] Fax: [phone]   Agent: Please be advised that RX refills may take up to 3 business days. We ask that you follow-up with your pharmacy.

## 2018-01-27 ENCOUNTER — Encounter: Payer: Self-pay | Admitting: Family Medicine

## 2018-01-27 ENCOUNTER — Ambulatory Visit: Payer: Medicaid Other | Admitting: Family Medicine

## 2018-01-27 VITALS — BP 107/65 | HR 65 | Temp 98.1°F | Ht 72.5 in | Wt 214.5 lb

## 2018-01-27 DIAGNOSIS — I10 Essential (primary) hypertension: Secondary | ICD-10-CM

## 2018-01-27 DIAGNOSIS — K7031 Alcoholic cirrhosis of liver with ascites: Secondary | ICD-10-CM | POA: Diagnosis not present

## 2018-01-27 DIAGNOSIS — B182 Chronic viral hepatitis C: Secondary | ICD-10-CM

## 2018-01-27 DIAGNOSIS — I8510 Secondary esophageal varices without bleeding: Secondary | ICD-10-CM | POA: Diagnosis not present

## 2018-01-27 DIAGNOSIS — G6289 Other specified polyneuropathies: Secondary | ICD-10-CM

## 2018-01-27 MED ORDER — DULOXETINE HCL 40 MG PO CPEP: 40.0000 mg | ORAL_CAPSULE | Freq: Every day | ORAL | 1 refills | Status: AC

## 2018-01-27 MED ORDER — PROPRANOLOL HCL 20 MG PO TABS: 20.0000 mg | ORAL_TABLET | Freq: Two times a day (BID) | ORAL | 1 refills | Status: AC

## 2018-01-27 MED ORDER — SPIRONOLACTONE 50 MG PO TABS: 50.0000 mg | ORAL_TABLET | Freq: Two times a day (BID) | ORAL | 1 refills | Status: AC

## 2018-01-27 MED ORDER — GABAPENTIN 300 MG PO CAPS: 1800.0000 mg | ORAL_CAPSULE | Freq: Two times a day (BID) | ORAL | 1 refills | Status: AC

## 2018-01-27 NOTE — Assessment & Plan Note (Signed)
Getting hypotension. Will decrease to 50mg  BID and recheck in 1 month. Call with any concerns.

## 2018-01-27 NOTE — Assessment & Plan Note (Signed)
Has not followed up with GI- will call to get rescheduled for his follow up. Stable. 

## 2018-01-27 NOTE — Patient Instructions (Signed)
Take 1/2 of your spironalactone 2x a day- new Rx for lower dose at the pharmacy when you use up what you have  Take 2 of your duloxetine daily-  new Rx for higher dose at the pharmacy when you use up what you have  Give Dr. Tobi BastosAnna a call

## 2018-01-27 NOTE — Assessment & Plan Note (Signed)
Has not followed up with GI- will call to get rescheduled for his follow up. Stable.

## 2018-01-27 NOTE — Progress Notes (Signed)
BP 107/65   Pulse 65   Temp 98.1 F (36.7 C) (Oral)   Ht 6' 0.5" (1.842 m)   Wt 214 lb 8 oz (97.3 kg)   SpO2 97%   BMI 28.69 kg/m    Subjective:    Patient ID: Antonio Ryan, male    DOB: 09-09-1956, 62 y.o.   MRN: [id number]  HPI: Antonio Ryan is a 62 y.o. male  Chief Complaint  Patient presents with  . Peripheral Neuropathy  . Hypertension   HYPERTENSION Hypertension status: controlled  Satisfied with current treatment? yes Duration of hypertension: chronic BP monitoring frequency:  not checking BP medication side effects:  yes- getting dizzy Medication compliance: excellent compliance Aspirin: no Recurrent headaches: no Visual changes: no Palpitations: no Dyspnea: no Chest pain: no Lower extremity edema: no Dizzy/lightheaded: yes  Has not followed up with GI, had issues with transportation.   NUMBNESS Duration: chronic Onset: gradual Location: bilateral feet Bilateral: yes Symmetric: yes Decreased sensation: yes  Weakness: no Pain: yes Quality:  Numbness and tingling Severity: moderate  Frequency: constant Trauma: no Recent illness: no Diabetes: no Thyroid disease: no  HIV: no  Alcoholism: yes  Spinal cord injury: no Alleviating factors:  Aggravating factors: Status: uncontrolled  Relevant past medical, surgical, family and social history reviewed and updated as indicated. Interim medical history since our last visit reviewed. Allergies and medications reviewed and updated.  Review of Systems  Constitutional: Negative.   Respiratory: Negative.   Cardiovascular: Negative.   Gastrointestinal: Negative.   Neurological: Positive for dizziness and numbness. Negative for tremors, seizures, syncope, facial asymmetry, speech difficulty, weakness, light-headedness and headaches.  Psychiatric/Behavioral: Negative.     Per HPI unless specifically indicated above     Objective:    BP 107/65   Pulse 65   Temp 98.1 F (36.7 C) (Oral)    Ht 6' 0.5" (1.842 m)   Wt 214 lb 8 oz (97.3 kg)   SpO2 97%   BMI 28.69 kg/m   Wt Readings from Last 3 Encounters:  01/27/18 214 lb 8 oz (97.3 kg)  11/22/17 210 lb (95.3 kg)  10/25/17 200 lb (90.7 kg)    Physical Exam  Constitutional: He is oriented to person, place, and time. He appears well-developed and well-nourished. No distress.  HENT:  Head: Normocephalic and atraumatic.  Right Ear: Hearing normal.  Left Ear: Hearing normal.  Nose: Nose normal.  Eyes: Conjunctivae and lids are normal. Right eye exhibits no discharge. Left eye exhibits no discharge. No scleral icterus.  Cardiovascular: Normal rate, regular rhythm, normal heart sounds and intact distal pulses. Exam reveals no gallop and no friction rub.  No murmur heard. Pulmonary/Chest: Effort normal and breath sounds normal. No respiratory distress. He has no wheezes. He has no rales.  Musculoskeletal: Normal range of motion.  Neurological: He is alert and oriented to person, place, and time.  Skin: Skin is warm, dry and intact. No rash noted. He is not diaphoretic. No erythema. No pallor.  Psychiatric: He has a normal mood and affect. His speech is normal and behavior is normal. Judgment and thought content normal. Cognition and memory are normal.    Results for orders placed or performed in visit on 07/30/17  CBC with Differential/Platelet  Result Value Ref Range   WBC 5.3 3.4 - 10.8 x10E3/uL   RBC 4.58 4.14 - 5.80 x10E6/uL   Hemoglobin 12.1 (L) 13.0 - 17.7 g/dL   Hematocrit 11.9 14.7 - 51.0 %   MCV 85 79 -  97 fL   MCH 26.4 (L) 26.6 - 33.0 pg   MCHC 30.9 (L) 31.5 - 35.7 g/dL   RDW 16.117.5 (H) 09.612.3 - 04.515.4 %   Platelets 160 150 - 379 x10E3/uL   Neutrophils 64 Not Estab. %   Lymphs 21 Not Estab. %   Monocytes 11 Not Estab. %   Eos 3 Not Estab. %   Basos 1 Not Estab. %   Neutrophils Absolute 3.4 1.4 - 7.0 x10E3/uL   Lymphocytes Absolute 1.1 0.7 - 3.1 x10E3/uL   Monocytes Absolute 0.6 0.1 - 0.9 x10E3/uL   EOS (ABSOLUTE)  0.2 0.0 - 0.4 x10E3/uL   Basophils Absolute 0.0 0.0 - 0.2 x10E3/uL   Immature Granulocytes 0 Not Estab. %   Immature Grans (Abs) 0.0 0.0 - 0.1 x10E3/uL  Comprehensive metabolic panel  Result Value Ref Range   Glucose 101 (H) 65 - 99 mg/dL   BUN 7 (L) 8 - 27 mg/dL   Creatinine, Ser 4.091.01 0.76 - 1.27 mg/dL   GFR calc non Af Amer 79 >59 mL/min/1.73   GFR calc Af Amer 91 >59 mL/min/1.73   BUN/Creatinine Ratio 7 (L) 10 - 24   Sodium 133 (L) 134 - 144 mmol/L   Potassium 4.5 3.5 - 5.2 mmol/L   Chloride 98 96 - 106 mmol/L   CO2 20 20 - 29 mmol/L   Calcium 9.4 8.6 - 10.2 mg/dL   Total Protein 7.8 6.0 - 8.5 g/dL   Albumin 4.1 3.6 - 4.8 g/dL   Globulin, Total 3.7 1.5 - 4.5 g/dL   Albumin/Globulin Ratio 1.1 (L) 1.2 - 2.2   Bilirubin Total 1.3 (H) 0.0 - 1.2 mg/dL   Alkaline Phosphatase 65 39 - 117 IU/L   AST 128 (H) 0 - 40 IU/L   ALT 90 (H) 0 - 44 IU/L  HCV RNA quant  Result Value Ref Range   Hepatitis C Quantitation 1,330,000 IU/mL   HCV log10 6.124 log10 IU/mL   Test Information Comment   Hepatitis B surface antibody  Result Value Ref Range   Hepatitis B Surf Ab Quant 274.9 Immunity>9.9 mIU/mL  TSH  Result Value Ref Range   TSH 3.200 0.450 - 4.500 uIU/mL  PSA  Result Value Ref Range   Prostate Specific Ag, Serum 6.0 (H) 0.0 - 4.0 ng/mL  Lipid Panel w/o Chol/HDL Ratio  Result Value Ref Range   Cholesterol, Total 154 100 - 199 mg/dL   Triglycerides 66 0 - 149 mg/dL   HDL 59 >81>39 mg/dL   VLDL Cholesterol Cal 13 5 - 40 mg/dL   LDL Calculated 82 0 - 99 mg/dL  Iron and TIBC  Result Value Ref Range   Total Iron Binding Capacity 510 (H) 250 - 450 ug/dL   UIBC 191468 (H) 478111 - 295343 ug/dL   Iron 42 38 - 621169 ug/dL   Iron Saturation 8 (LL) 15 - 55 %  HIV antibody  Result Value Ref Range   HIV Screen 4th Generation wRfx Non Reactive Non Reactive  Hepatitis A Ab, Total  Result Value Ref Range   Hep A Total Ab Positive (A) Negative  Hepatitis B Core Antibody, total  Result Value Ref Range     Hep B Core Total Ab Positive (A) Negative  Hepatitis B Surface AntiGEN  Result Value Ref Range   Hepatitis B Surface Ag Negative Negative  Celiac Disease Panel  Result Value Ref Range   Endomysial IgA Negative Negative   Transglutaminase IgA <2 0 - 3 U/mL  IgA/Immunoglobulin A, Serum 703 (H) 61 - 437 mg/dL  Immunoglobulins, QN, A/E/G/M  Result Value Ref Range   IgG (Immunoglobin G), Serum 1,854 (H) 700 - 1,600 mg/dL   IgM (Immunoglobulin M), Srm 189 (H) 20 - 172 mg/dL   IgE (Immunoglobulin E), Serum 31 0 - 100 IU/mL  Ceruloplasmin  Result Value Ref Range   Ceruloplasmin 24.3 16.0 - 31.0 mg/dL  [id number]  Result Value Ref Range   A-1 Antitrypsin 167 90 - 200 mg/dL  Ferritin  Result Value Ref Range   Ferritin 23 (L) 30 - 400 ng/mL  ANA w/Reflex  Result Value Ref Range   Anit Nuclear Antibody(ANA) Negative Negative  Mitochondrial antibodies  Result Value Ref Range   Mitochondrial Ab 4.5 0.0 - 20.0 Units  Anti-smooth muscle antibody, IgG  Result Value Ref Range   Smooth Muscle Ab 32 (H) 0 - 19 Units  INR/PT  Result Value Ref Range   INR 1.2 0.8 - 1.2   Prothrombin Time 12.3 (H) 9.1 - 12.0 sec  AntiMicrosomal Ab-Liver / Kidney  Result Value Ref Range   LKM1 Ab 2.6 0.0 - 20.0 Units      Assessment & Plan:   Problem List Items Addressed This Visit      Cardiovascular and Mediastinum   Esophageal varices without bleeding (HCC)    Has not followed up with GI- will call to get rescheduled for his follow up. Stable.      Relevant Medications   spironolactone (ALDACTONE) 50 MG tablet   propranolol (INDERAL) 20 MG tablet   Essential hypertension - Primary    Getting hypotension. Will decrease to 50mg  BID and recheck in 1 month. Call with any concerns.       Relevant Medications   spironolactone (ALDACTONE) 50 MG tablet   propranolol (INDERAL) 20 MG tablet   Other Relevant Orders   Comprehensive metabolic panel     Digestive   Hepatitis C    Has not  followed up with GI- will call to get rescheduled for his follow up. Stable.      Relevant Orders   Comprehensive metabolic panel     Nervous and Auditory   Peripheral neuropathy    Not under good control. Will increase cymbalta to 40mg  and recheck in 1 month. Call with any concerns.       Relevant Medications   DULoxetine 40 MG CPEP   gabapentin (NEURONTIN) 300 MG capsule   Other Relevant Orders   Comprehensive metabolic panel     Other   Ascites due to alcoholic cirrhosis (HCC)    Has not followed up with GI- will call to get rescheduled for his follow up. Stable.      Relevant Medications   spironolactone (ALDACTONE) 50 MG tablet    Other Visit Diagnoses    Alcoholic cirrhosis of liver with ascites (HCC)       Relevant Medications   propranolol (INDERAL) 20 MG tablet       Follow up plan: Return in about 1 month (around 02/24/2018) for follow up BP and peripheral neuropathy.

## 2018-01-27 NOTE — Assessment & Plan Note (Signed)
Not under good control. Will increase cymbalta to 40mg  and recheck in 1 month. Call with any concerns.

## 2018-01-28 ENCOUNTER — Encounter: Payer: Self-pay | Admitting: Family Medicine

## 2018-01-28 LAB — COMPREHENSIVE METABOLIC PANEL
ALBUMIN: 3.6 g/dL (ref 3.6–4.8)
ALT: 58 IU/L — ABNORMAL HIGH (ref 0–44)
AST: 103 IU/L — ABNORMAL HIGH (ref 0–40)
Albumin/Globulin Ratio: 1.2 (ref 1.2–2.2)
Alkaline Phosphatase: 70 IU/L (ref 39–117)
BUN / CREAT RATIO: 8 — AB (ref 10–24)
BUN: 7 mg/dL — ABNORMAL LOW (ref 8–27)
Bilirubin Total: 0.8 mg/dL (ref 0.0–1.2)
CALCIUM: 8.9 mg/dL (ref 8.6–10.2)
CO2: 25 mmol/L (ref 20–29)
CREATININE: 0.93 mg/dL (ref 0.76–1.27)
Chloride: 99 mmol/L (ref 96–106)
GFR calc Af Amer: 101 mL/min/{1.73_m2} (ref >59)
GFR, EST NON AFRICAN AMERICAN: 87 mL/min/{1.73_m2} (ref >59)
GLOBULIN, TOTAL: 2.9 g/dL (ref 1.5–4.5)
Glucose: 109 mg/dL — ABNORMAL HIGH (ref 65–99)
Potassium: 5.2 mmol/L (ref 3.5–5.2)
Sodium: 138 mmol/L (ref 134–144)
Total Protein: 6.5 g/dL (ref 6.0–8.5)

## 2018-01-28 NOTE — Progress Notes (Signed)
Letter generated

## 2018-02-24 ENCOUNTER — Ambulatory Visit: Payer: Medicaid Other | Admitting: Family Medicine

## 2018-03-04 ENCOUNTER — Ambulatory Visit: Payer: Medicaid Other | Admitting: Family Medicine

## 2018-03-04 NOTE — Progress Notes (Deleted)
   There were no vitals taken for this visit.   Subjective:    Patient ID: Antonio Ryan, male    DOB: 1956/05/15, 62 y.o.   MRN: [id number]  HPI: Antonio Ryan is a 62 y.o. male  No chief complaint on file.   Relevant past medical, surgical, family and social history reviewed and updated as indicated. Interim medical history since our last visit reviewed. Allergies and medications reviewed and updated.  Review of Systems  Per HPI unless specifically indicated above     Objective:    There were no vitals taken for this visit.  Wt Readings from Last 3 Encounters:  01/27/18 214 lb 8 oz (97.3 kg)  11/22/17 210 lb (95.3 kg)  10/25/17 200 lb (90.7 kg)    Physical Exam  Results for orders placed or performed in visit on 01/27/18  Comprehensive metabolic panel  Result Value Ref Range   Glucose 109 (H) 65 - 99 mg/dL   BUN 7 (L) 8 - 27 mg/dL   Creatinine, Ser 4.09 0.76 - 1.27 mg/dL   GFR calc non Af Amer 87 >59 mL/min/1.73   GFR calc Af Amer 101 >59 mL/min/1.73   BUN/Creatinine Ratio 8 (L) 10 - 24   Sodium 138 134 - 144 mmol/L   Potassium 5.2 3.5 - 5.2 mmol/L   Chloride 99 96 - 106 mmol/L   CO2 25 20 - 29 mmol/L   Calcium 8.9 8.6 - 10.2 mg/dL   Total Protein 6.5 6.0 - 8.5 g/dL   Albumin 3.6 3.6 - 4.8 g/dL   Globulin, Total 2.9 1.5 - 4.5 g/dL   Albumin/Globulin Ratio 1.2 1.2 - 2.2   Bilirubin Total 0.8 0.0 - 1.2 mg/dL   Alkaline Phosphatase 70 39 - 117 IU/L   AST 103 (H) 0 - 40 IU/L   ALT 58 (H) 0 - 44 IU/L      Assessment & Plan:   Problem List Items Addressed This Visit    None       Follow up plan: No follow-ups on file.

## 2018-03-20 IMAGING — CT CT ABD-PELV W/ CM
2 of 5 series · 16 of 46 positions shown, 18 images · IV contrast (APPLIED)
Comparison: CT abdomen and pelvis 08/18/2016.

CLINICAL DATA: Nausea, vomiting and diarrhea for 1 month. History
of hepatitis C and cirrhosis.

EXAM:
CT ABDOMEN AND PELVIS WITH CONTRAST
TECHNIQUE: Multidetector CT imaging of the abdomen and pelvis was performed
using the standard protocol following bolus administration of
intravenous contrast.
CONTRAST:  100 ml [id number]-[id number] IOPAMIDOL (9UY24W-5FF) INJECTION 61%

[Series 2: axial st · axial · 0.84mm/px · z∈[-528,-44]mm · 13 of 111 slices shown, 15 images]
[im 7/111  soft-tissue]
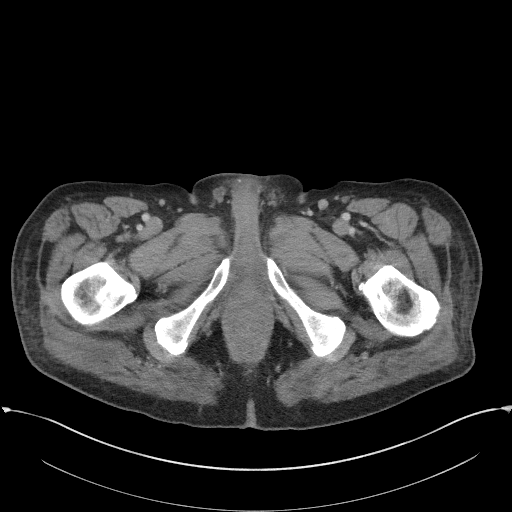
[im 7/111  bone]
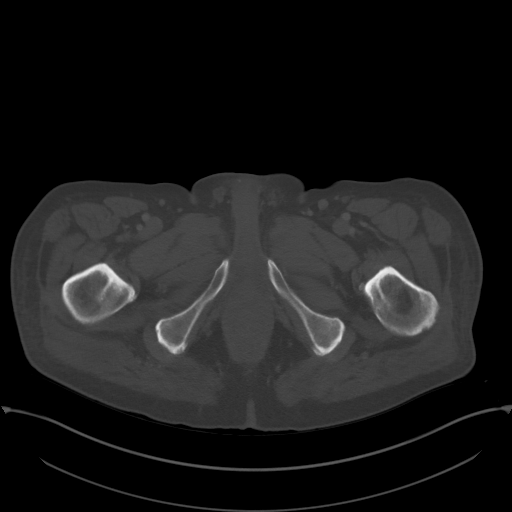
[im 14/111  soft-tissue]
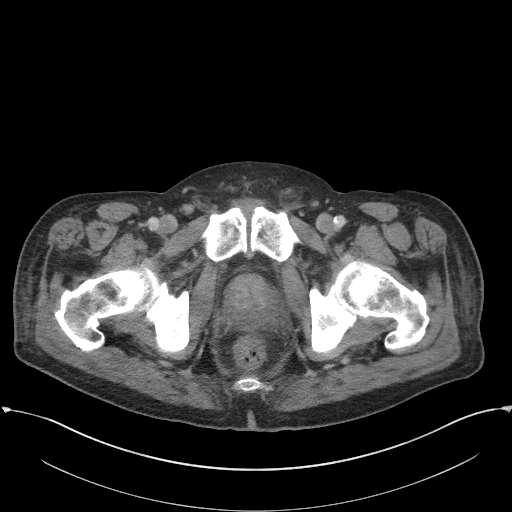
[im 21/111  soft-tissue]
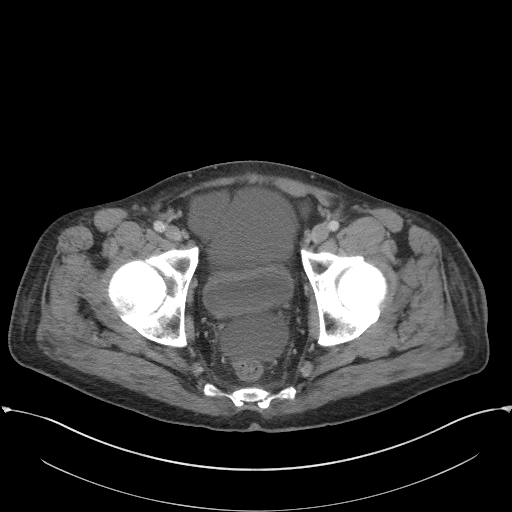
[im 35/111  soft-tissue]
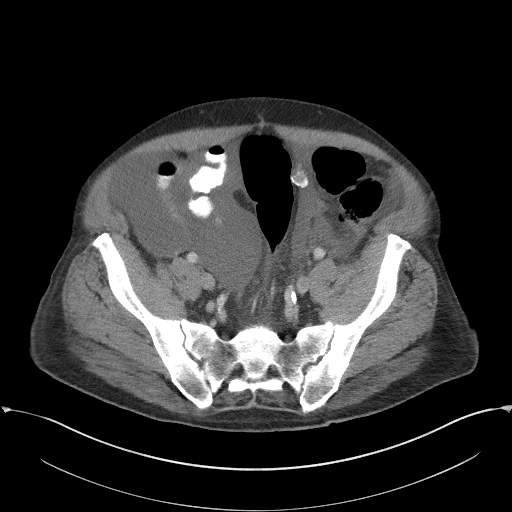
[im 42/111  soft-tissue]
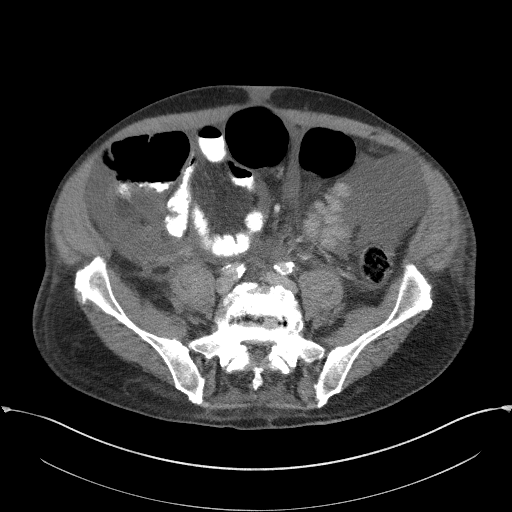
[im 49/111  soft-tissue]
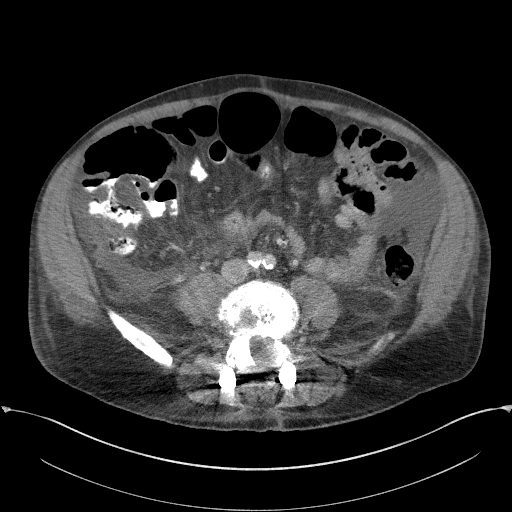
[im 56/111  soft-tissue]
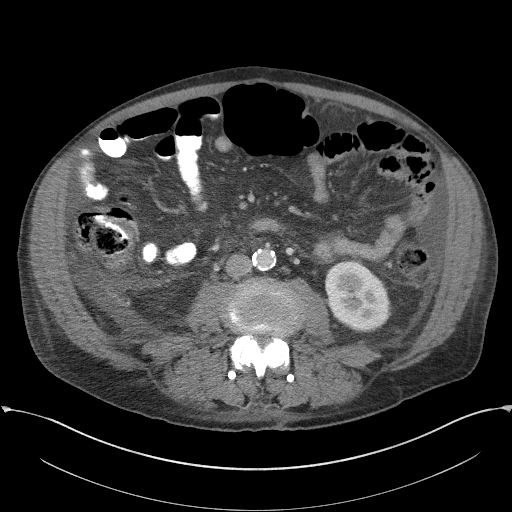
[im 62/111  soft-tissue]
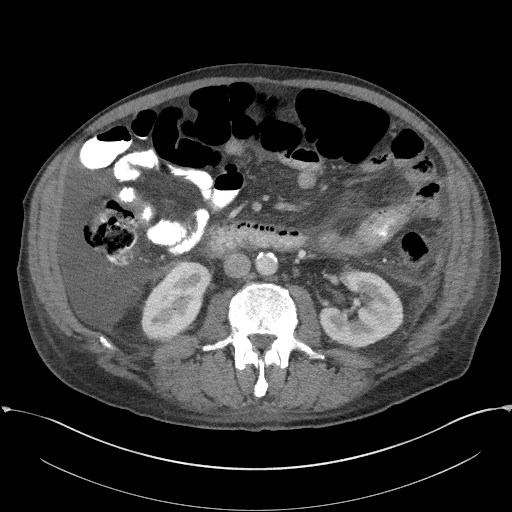
[im 69/111  soft-tissue]
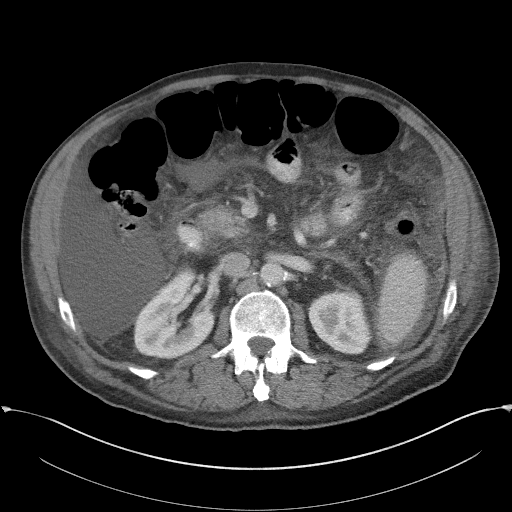
[im 69/111  bone]
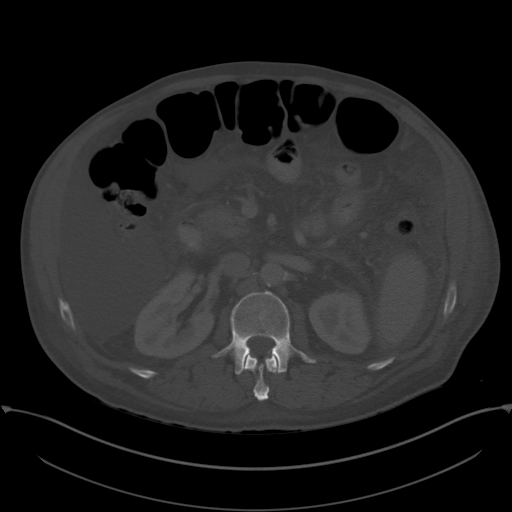
[im 76/111  soft-tissue]
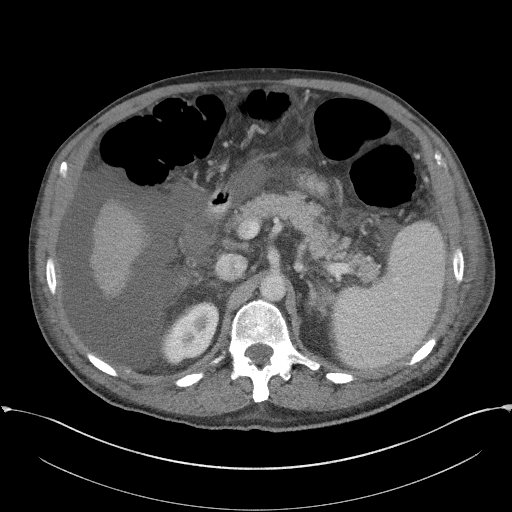
[im 90/111  soft-tissue]
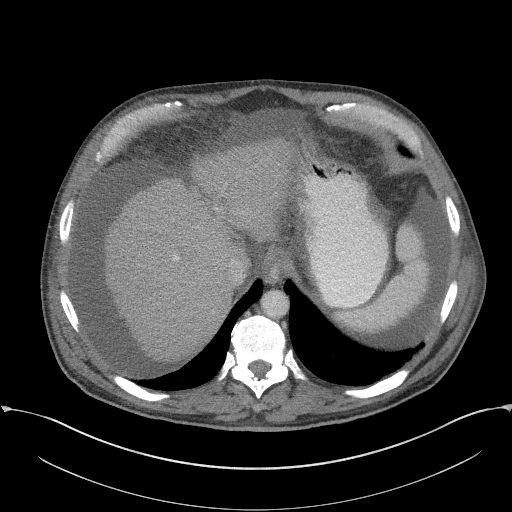
[im 97/111  soft-tissue]
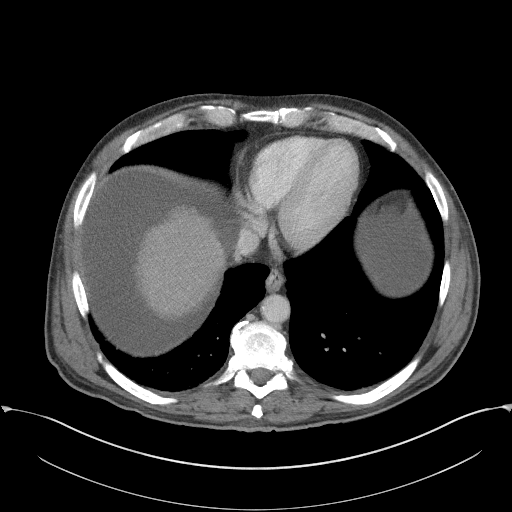
[im 104/111  soft-tissue]
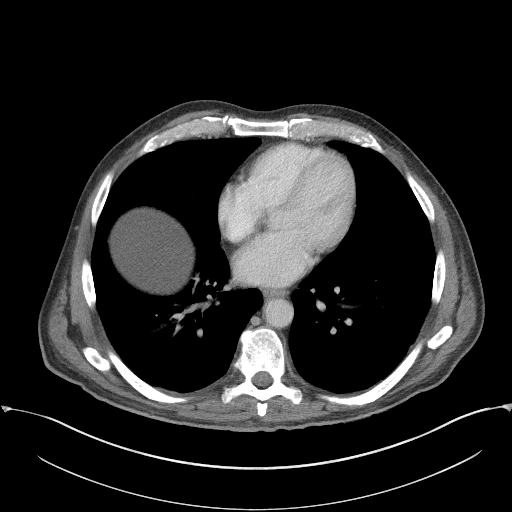

[Series 5: coronal st · coronal · 0.87mm/px · 3 of 101 slices shown]
[im 34/101  soft-tissue]
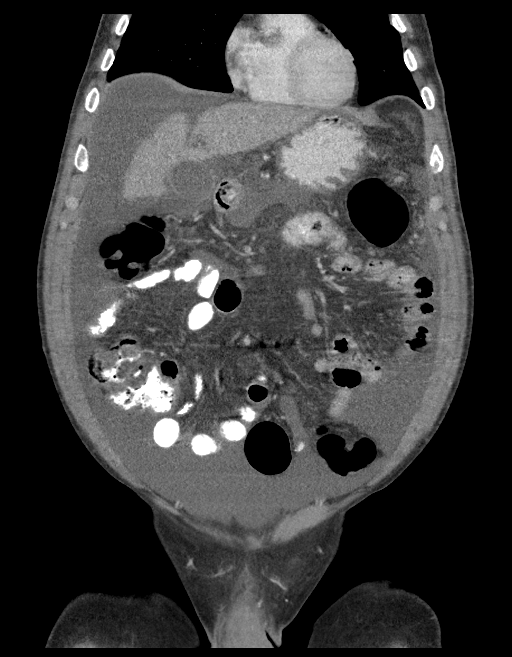
[im 45/101  soft-tissue]
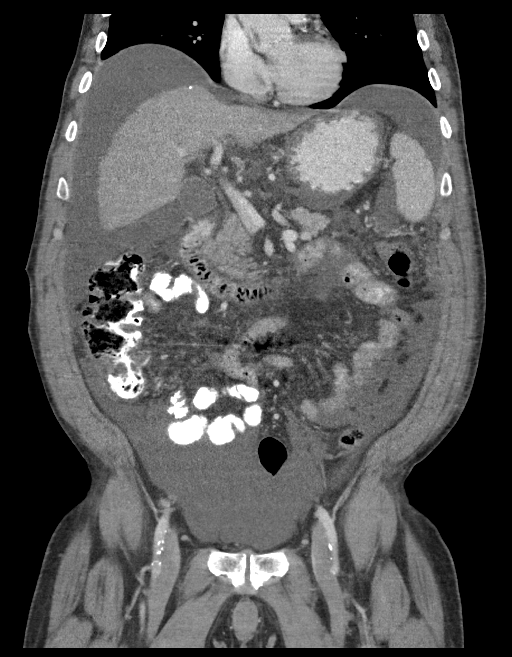
[im 56/101  soft-tissue]
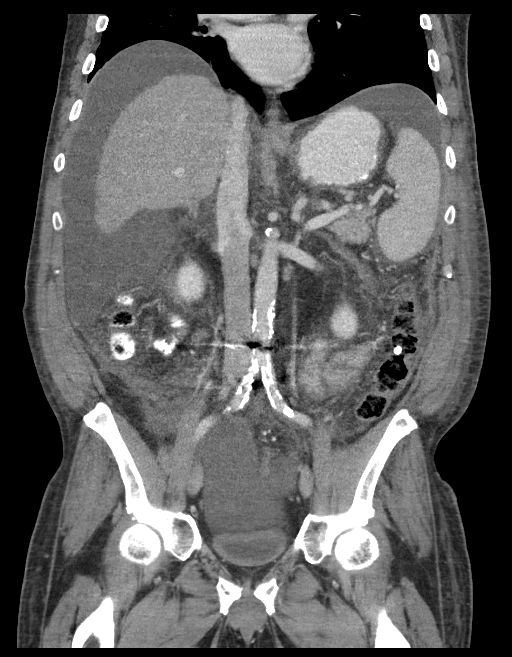

[16 of 46 positions shown; findings below may reference images not displayed]

FINDINGS: Lower chest: Mild basilar atelectasis. Calcific coronary artery
disease is seen. No pleural or pericardial effusion.

Hepatobiliary: Markedly shrunken and nodular liver consistent with
cirrhosis is again identified. Associated moderate to moderately
large volume of ascites is decreased since the prior study. The
gallbladder and biliary tree are unremarkable. No focal liver
lesion.

Pancreas: Appears normal.

Spleen: Normal in size.  No focal lesion.

Adrenals/Urinary Tract: Adrenal glands are unremarkable. Kidneys are
normal, without renal calculi, focal lesion, or hydronephrosis.
Bladder is unremarkable.

Stomach/Bowel: Mild thickening of the walls of the ascending colon
is likely related to cirrhosis. The colon is otherwise unremarkable.
The stomach and small bowel appear normal.

Vascular/Lymphatic: Aortoiliac atherosclerosis without aneurysm is
identified. A few small gastrohepatic lymph nodes are likely
reactive.

Reproductive: Prostate gland is slightly prominent.

Other: No hernia.

Musculoskeletal: No acute bony abnormality. Status post L4-5 fusion.
IMPRESSION: No acute abnormality.

Cirrhotic liver with associated moderate to moderately large volume
of ascites. Ascites is decreased since the prior CT.

Aortoiliac atherosclerosis.

## 2018-04-08 ENCOUNTER — Telehealth: Payer: Self-pay | Admitting: Family Medicine

## 2018-04-08 NOTE — Telephone Encounter (Signed)
Patient is requesting a list of his medication be faxed to [facility] Fax # [phone]   If any question he can be reached at the following [phone]  Thank you

## 2018-04-09 NOTE — Telephone Encounter (Signed)
Medication list faxed.

## 2018-04-09 NOTE — Telephone Encounter (Signed)
Let pt. Know that fax was sent

## 2018-05-19 ENCOUNTER — Telehealth: Payer: Self-pay

## 2018-05-19 NOTE — Telephone Encounter (Signed)
Tried to do a prior authorization on duloxetine 40 mg through [employer], AT[employer]patient's insurance is not active at this time.

## 2018-06-08 IMAGING — US US PARACENTESIS
1 series · 6 of 6 positions shown · non-contrast
Comparison: none

INDICATION: Cirrhosis and ascites.

[Series 1: us paracentesis · 0.23mm/px · 6 of 6 slices shown]
[im 1/6]
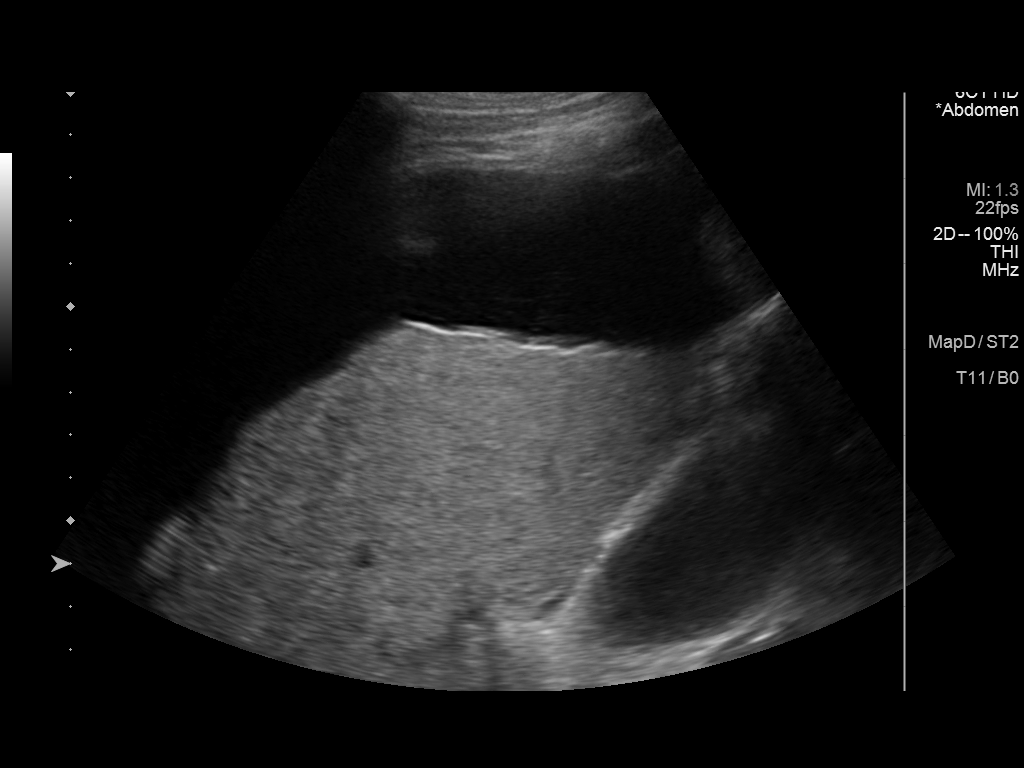
[im 2/6]
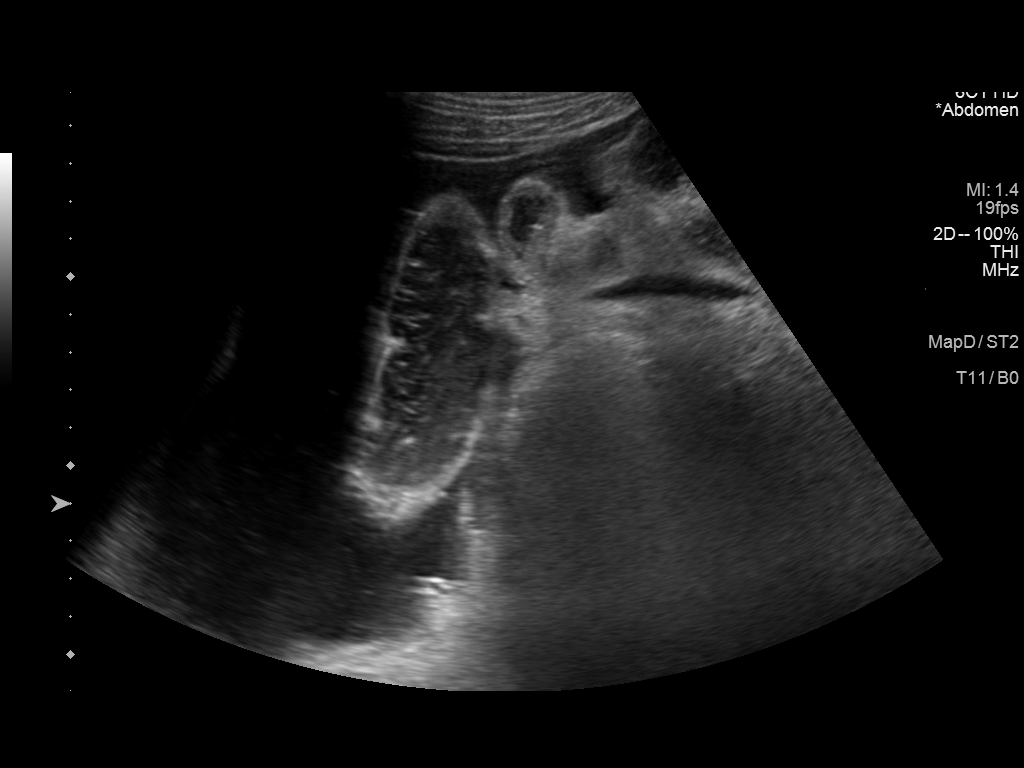
[im 3/6]
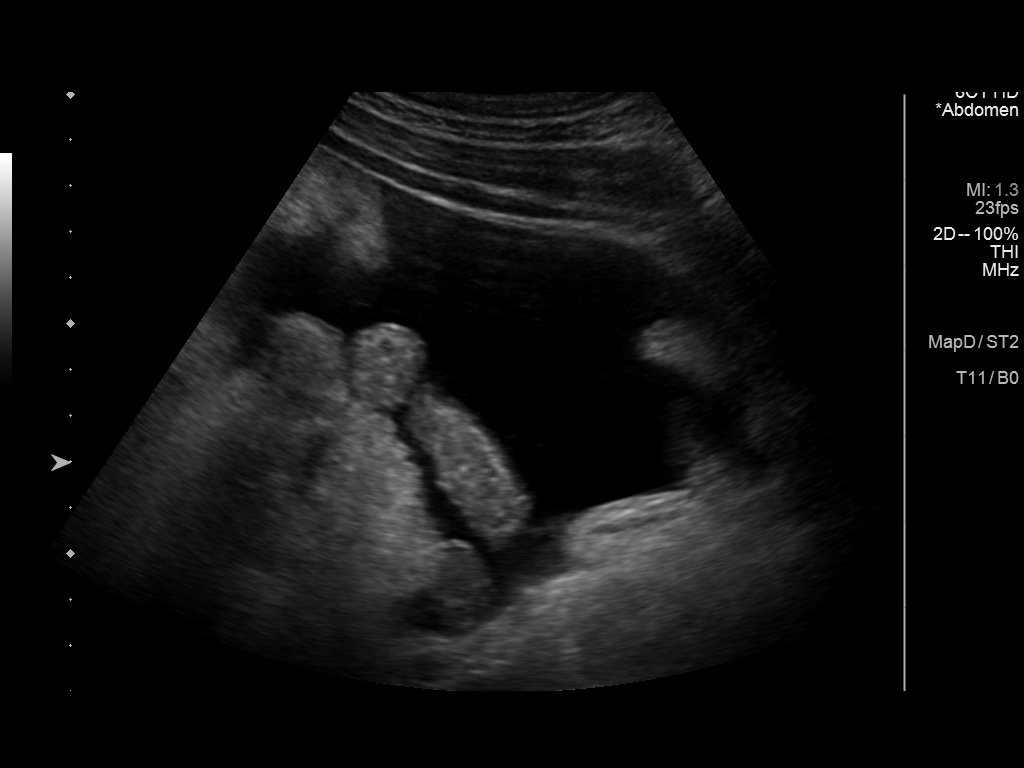
[im 4/6]
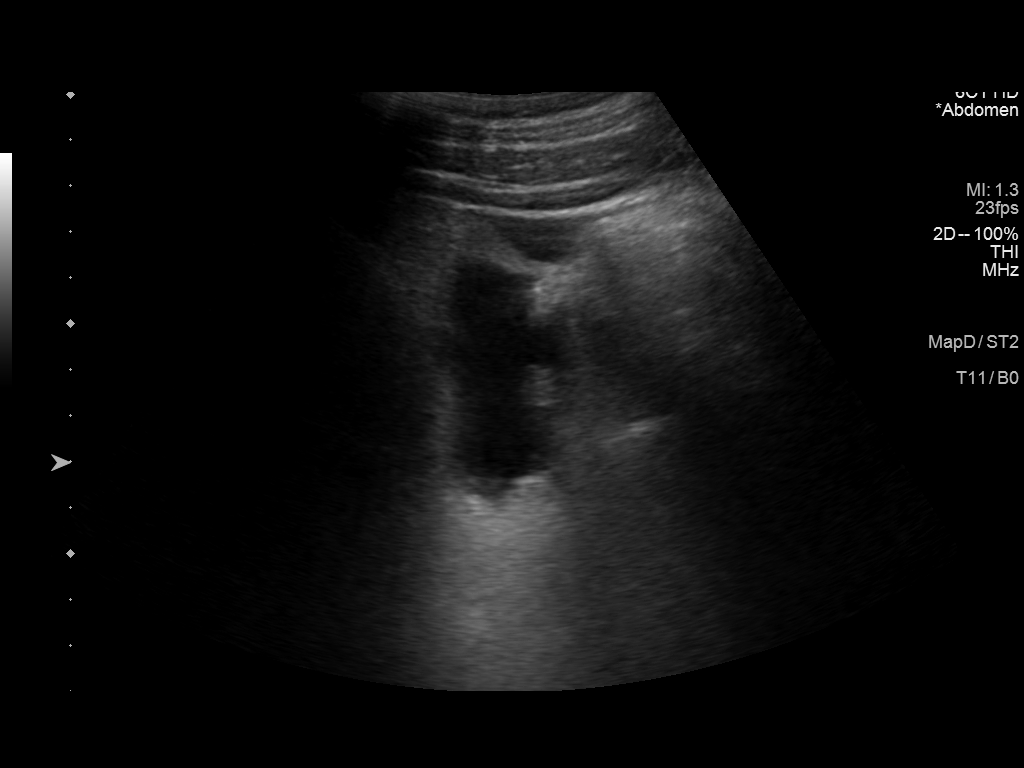
[im 5/6]
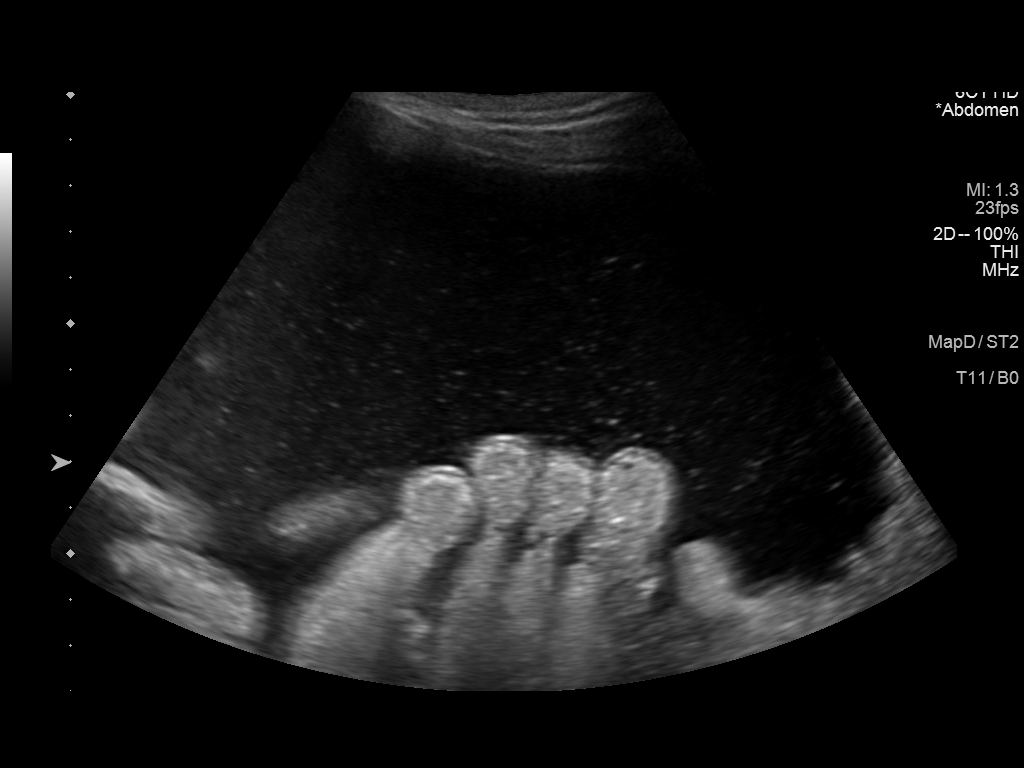
[im 6/6]
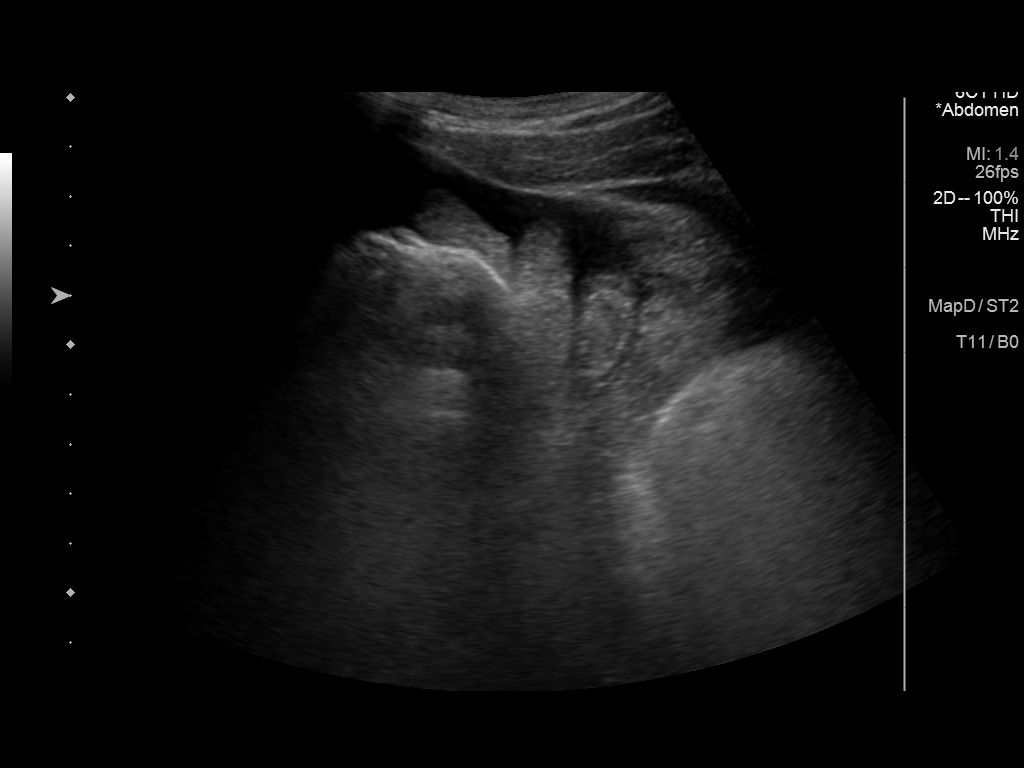

[6 of 6 positions shown; findings below may reference images not displayed]

EXAM:
ULTRASOUND GUIDED PARACENTESIS

MEDICATIONS:
None.

COMPLICATIONS:
None immediate.

PROCEDURE:
Informed written consent was obtained from the patient after a
discussion of the risks, benefits and alternatives to treatment. A
timeout was performed prior to the initiation of the procedure.

Initial ultrasound was performed to localize ascites. The left lower
abdomen was prepped and draped in the usual sterile fashion. 1%
lidocaine was used for local anesthesia.

Following this, a 6 Fr Safe-T-Centesis catheter was introduced. An
ultrasound image was saved for documentation purposes. The
paracentesis was performed. The catheter was removed and a dressing
was applied. The patient tolerated the procedure well without
immediate post procedural complication.
FINDINGS: A total of approximately 4.5 L of yellow fluid was removed.
IMPRESSION: Successful ultrasound-guided paracentesis yielding 4.5 liters of
peritoneal fluid.

## 2019-07-31 ENCOUNTER — Ambulatory Visit: Admitting: Cardiovascular Disease

## 2019-08-13 ENCOUNTER — Ambulatory Visit

## 2019-09-08 ENCOUNTER — Ambulatory Visit

## 2019-10-12 ENCOUNTER — Ambulatory Visit

## 2019-11-04 ENCOUNTER — Ambulatory Visit

## 2019-11-27 ENCOUNTER — Ambulatory Visit

## 2020-01-06 ENCOUNTER — Ambulatory Visit

## 2020-01-08 ENCOUNTER — Ambulatory Visit

## 2020-01-29 ENCOUNTER — Ambulatory Visit

## 2020-01-29 ENCOUNTER — Ambulatory Visit: Admitting: Orthopaedic Surgery

## 2020-01-29 ENCOUNTER — Ambulatory Visit: Admit: 2020-01-29 | Payer: 59

## 2020-01-29 DIAGNOSIS — M5416 Radiculopathy, lumbar region: Secondary | ICD-10-CM

## 2020-01-29 DIAGNOSIS — M48061 Spinal stenosis, lumbar region without neurogenic claudication: Principal | ICD-10-CM

## 2020-01-29 NOTE — Progress Notes (Signed)
 .  Progress Notes  .  Patient: Garrett Allison, Garrett Allison  Provider: Rolinda Roan  DOB: 1956/05/08 Age: 64 Y Sex: Male  Supervising Provider:: Evelena Asa, MD  Date: 01/29/2020  .  PCP: Beau Fanny  MD  Date: 01/29/2020  .  --------------------------------------------------------------------------------  .  REASON FOR APPOINTMENT  .  1. Low back pain  .  HISTORY OF PRESENT ILLNESS  .  GENERAL:   [name]") is a 65 year old male who presents today  for evaluation of low back pain. He had a previous L4-5 interbody  fusion in 2012 performed in [place]. He states he has had back  pain for over 10 years. He recently saw Dr. Ricarda Frame for  evaluation of back pain and he was referred to our office. He  states he has back pain daily. He also has bilateral radiating  leg pain into the ankles. 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 He states he is very  debilitated from the neuropathy and without gabapentin he is  unable to sleep. He takes ibuprofen for pain control. No previous  steriod injections. No physical therapy. He states he cannot  stand for a long period of time due to pain in his back and he  will lean forward to help alleviate the pain. He uses a walker to  ambulate due to poor balance. 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 He states he has had bilateral  carpal tunnel release but still has small finger numbness  bilaterally. He states he no longer drinks alcohol. He smokes 1/2  ppd. He has a GI doctor for his Hepatitis C with cirrhosis, he  was recently prescribed a medication for Hep C, but has not yet  started it.  .  CURRENT MEDICATIONS  .  None  .  PAST MEDICAL HISTORY  .  EtOH abuse (past)  .  ALLERGIES  .  N.K.D.A.  .  SURGICAL HISTORY  .  L4-5 TLIF [facility]) 2012  .  FAMILY  HISTORY  .  Non-Contributory  .  SOCIAL HISTORY  .  .  Tobaccohistory:Currently smoking 1/2 PPD.  Garrett Allison  Lives independenty, uses a walker all the time, insurance  provides rides.  Garrett Allison  REVIEW OF SYSTEMS  .  PEDI ORT:  .  Fever    No . Gait changes    No . Night pain    No . Heart  problems    No . Lung/Breathing problems    No . Chills/ Night  sweats    No . Frequent Headaches    No . Weakness    No .  Stomach problems    No . Other bone or joint problems    No .  Weight change    No . Skin Sores/Rashes    No . Numbness/Tingling     No . Urinary/Bladder Problems    No .  .  VITAL SIGNS  .  Pain scale 5.  .  EXAMINATION  .  Head: MRI of the lumbar spine (outside study)  obtained on 07/09/2019 was reviewed today as well as the  radiologist report. Imaging uploaded to [facility]. MRI reveals overall  neutral alignment with previous interbody fusion at L4-5. Slight  retrolisthesis at L5-S1. There is moderate central stenosis at  L3-4 with preserved disc height. At L4-5, there is no significant  stenosis at L4-5, hardware without obvious complication, but  difficult to appreciate on MRI. At L5-S1 there is severe  bilateral foraminial stenosis without significant central  stenosis. There is preserved disc height. Lumbar spine XR from  04/2019 reviewed today. AP and lateral XR reviewed. Overall  neutral alignment with loss of lordosis. There is intact hardware  at L4-5 with interbody in place. No evidence of hardware  complication.  Garrett Allison  PHYSICAL EXAMINATION  .  Patient is well-nourished, well-appearing and in no distress.  Ambulates with a walker in the office. Non-antalgic gait, leans  forward on the walker while ambulating. No tenderness to  palpation in the midline or cervical paraspinal muscle groups.  5/5 strength in the deltoid, biceps, triceps, wrist extensor,  finger flexor and interosseous muscle groups bilaterally. Altered  sensation in the right hand due to a previous nerve injury at  work. Otherwise LUE sensation intact to  light touch in the C5-T1  nerve distributions except the small finger, which has  paresthesias.. Negative Hoffman's, symmetric 2+ biceps,  brachioradialis reflexes and triceps reflex bilaterally. Hands  are warm and well perfused with no peripheral edema. On  examination of lower extremities, 5/5 strength in bilateral  iliopsoas, quads, tibialis anterior, and gastroc muscle groups.  3/5 strength with EHL. Paresthesias from the knee down to the  feet bilaterally. Positive straight leg raise on the right. 1+  patellar reflex on the right and 2+ on the left. Symmetric  Achilles reflexes. Extremities are warm and well-perfused without  evidence of peripheral edema. Palpable pedal pulses.  .  ASSESSMENTS  .  Spinal stenosis of lumbar region, unspecified whether neurogenic  claudication present - M48.061 (Primary)  .  Lumbar radiculopathy, chronic - M54.16  .  TREATMENT  .  Spinal stenosis of lumbar region, unspecified  whether neurogenic claudication present  Notes: Patient seen and examined today by Dr. Charlton Amor. We  reviewed the MRI with Mr. Shackett and discussed that he has  adjacent disease both at L3-4 and L5-S1 with central stenosis at  L3-4 and foraminal stenosis at L5-S1. Given that he has had  impaired balance for a few years, although this can be from  neuropathy/alcohol use, we would like to obtain a MRI of the  cervical spine to evaluate for cord compression. We would also  like to obtain a CT scan of the lumbar spine to assess the fusion  mass. We would like him over the next few weeks while he is  obtaining the imaging studies to assess his symptoms and  determine which symptoms are most debilitating. We reviewed that  his neuropathy likely would not get better from surgery nor would  the back pain, however, his leg back would improve. At the next  visit, we would like him to get a scoliosis series as well. All  of his questions were answered, he was in agreement with the  plan.  Gerri Lins, MD  saw and examined the patient. I have  discussed and agree with the findings and plan as described above  by Burna Sis, MD.  .  FOLLOW UP  .  after CT, MRI c spine, will need full length scoli series prior  to next visit  .  Garrett Allison  Appointment Provider: Rolinda Roan, MD  .  Electronically signed by Evelena Asa , MD on  02/01/2020 at 10:15 AM EDT  .  CONFIRMATORY SIGN OFF  .  Garrett Allison  Document electronically signed by Rolinda Roan

## 2020-01-29 NOTE — Progress Notes (Signed)
 Garrett Allison, Garrett Allison **DOB:** 08-29-1956 [id number] yo M) **Acc No.** [id number] **DOS:**  01/29/2020    ---       Garrett Allison, Garrett Allison**    ------    74 Y old Male, DOB: 09-08-1956, External MRN: [id number]    Account Number: [id number]    [address], [place]    Home: [phone]    Insurance: Cleatrice Burke    PCP: Garrett Fanny, MD Referring: Garrett Fanny, MD    [facility]        * * *    01/29/2020  **Appointment Provider:** Garrett Roan, MD **CHN#:** [id number]    ------     **Supervising Provider:** Garrett Asa, MD    ---       **Reason for Appointment**    ---      1\. Low back pain    ---      **History of Present Illness**    ---     _GENERAL_ :    [name]") is a 64 year old male who presents today for  evaluation of low back pain. He had a previous L4-5 interbody fusion in 2012  performed in [place]. He states he has had back pain for over 10 years. He  recently saw Dr. Ricarda Allison for evaluation of back pain and he was referred to  our office. He states he has back pain daily. He also has bilateral radiating  leg pain into the ankles. He has neuropathy from the knee to his feet and he  takes gabapentin for neuropathy. He states he is very debilitated from the  neuropathy and without gabapentin he is unable to sleep. He takes ibuprofen  for pain control. No previous steriod injections. No physical therapy. He  states he cannot stand for a long period of time due to pain in his back and  he will lean forward to help alleviate the pain. He uses a walker to ambulate  due to poor balance. He had a previous injury to the right hand at work and he  has trouble buttoning his shirt and some fine motor tasks due to the numbness  in the right hand from the nerve injury from glass. He states he has had  bilateral carpal tunnel release but still has small finger numbness  bilaterally. He states he no longer drinks alcohol. He smokes 1/2 ppd.  He has  a GI doctor for his Hepatitis C with cirrhosis, he was recently prescribed a  medication for Hep C, but has not yet started it.      **Current Medications**    ---      None    ---      **Past Medical History**    ---      EtOH abuse (past).        ---      **Surgical History**    ---      L4-5 TLIF ([place]) 2012    ---      **Family History**    ---      Non-Contributory    ---      **Social History**    ---    Tobacco  history: Currently smoking 1/2 PPD.  Lives independenty, uses a  walker all the time, insurance provides rides.    ---      **Allergies**    ---      N.K.D.A.    ---      **Review  of Systems**    ---     _PEDI ORT_ :    Fever No. Gait changes No. Night pain No. Heart problems No. Lung/Breathing  problems No. Chills/ Night sweats No. Frequent Headaches No. Weakness No.  Stomach problems No. Other bone or joint problems No. Weight change No. Skin  Sores/Rashes No. Numbness/Tingling No. Urinary/Bladder Problems No.         **Vital Signs**    ---    Pain scale 5.      **Examination**    ---     _Head:_    MRI of the lumbar spine (outside study) obtained on 07/09/2019 was reviewed  today as well as the radiologist report. Imaging uploaded to [facility]. MRI reveals  overall neutral alignment with previous interbody fusion at L4-5. Slight  retrolisthesis at L5-S1. There is moderate central stenosis at L3-4 with  preserved disc height. At L4-5, there is no significant stenosis at L4-5,  hardware without obvious complication, but difficult to appreciate on MRI. At  L5-S1 there is severe bilateral foraminial stenosis without significant  central stenosis. There is preserved disc height.    Lumbar spine XR from 04/2019 reviewed today. AP and lateral XR reviewed.  Overall neutral alignment with loss of lordosis. There is intact hardware at  L4-5 with interbody in place. No evidence of hardware complication.          **Physical Examination**    ---    Patient is well-nourished, well-appearing  and in no distress. Ambulates with a  walker in the office. Non-antalgic gait, leans forward on the walker while  ambulating. No tenderness to palpation in the midline or cervical paraspinal  muscle groups. 5/5 strength in the deltoid, biceps, triceps, wrist extensor,  finger flexor and interosseous muscle groups bilaterally. Altered sensation in  the right hand due to a previous nerve injury at work. Otherwise LUE sensation  intact to light touch in the C5-T1 nerve distributions except the small  finger, which has paresthesias.. Negative Hoffman's, symmetric 2+ biceps,  brachioradialis reflexes and triceps reflex bilaterally. Hands are warm and  well perfused with no peripheral edema.    On examination of lower extremities, 5/5 strength in bilateral iliopsoas,  quads, tibialis anterior, and gastroc muscle groups. 3/5 strength with EHL.  Paresthesias from the knee down to the feet bilaterally. Positive straight leg  raise on the right. 1+ patellar reflex on the right and 2+ on the left.  Symmetric Achilles reflexes. Extremities are warm and well-perfused without  evidence of peripheral edema. Palpable pedal pulses.      **Assessments**    ---    1\. Spinal stenosis of lumbar region, unspecified whether neurogenic  claudication present - M48.061 (Primary)    ---    2\. Lumbar radiculopathy, chronic - M54.16    ---      **Treatment**    ---      **1\. Spinal stenosis of lumbar region, unspecified whether neurogenic  claudication present**    Notes: Patient seen and examined today by Garrett Allison. We reviewed the MRI  with Garrett Allison and discussed that he has adjacent disease both at L3-4 and  L5-S1 with central stenosis at L3-4 and foraminal stenosis at L5-S1. Given  that he has had impaired balance for a few years, although this can be from  neuropathy/alcohol use, we would like to obtain a MRI of the cervical spine to  evaluate for cord compression. We would also like to obtain a CT scan of the  lumbar spine  to assess the fusion mass. We would like him over the next few  weeks while he is obtaining the imaging studies to assess his symptoms and  determine which symptoms are most debilitating. We reviewed that his  neuropathy likely would not get better from surgery nor would the back pain,  however, his leg back would improve. At the next visit, we would like him to  get a scoliosis series as well. All of his questions were answered, he was in  agreement with the plan.        I, Garrett Asa, MD saw and examined the patient. I have discussed and  agree with the findings and plan as described above by Burna Sis, MD.    ---     **Follow Up**    ---    after CT, MRI c spine, will need full length scoli series prior to next visit    **Appointment Provider:** Garrett Roan, MD    Electronically signed by Garrett Allison , MD on 02/01/2020 at 10:15 AM EDT    Sign off status: Completed        * * *        [facility]    [address]    [place], [place] [address]    Tel: [phone]    Fax: [phone]              * * *          Progress Note: Garrett Roan, MD 01/29/2020    ---    Note generated by eClinicalWorks EMR/PM Software ([email or web address])

## 2020-01-30 ENCOUNTER — Ambulatory Visit (HOSPITAL_BASED_OUTPATIENT_CLINIC_OR_DEPARTMENT_OTHER): Admitting: Psychiatry

## 2020-02-03 ENCOUNTER — Ambulatory Visit: Admitting: Dermatology

## 2020-03-23 ENCOUNTER — Ambulatory Visit: Admitting: Orthopaedic Surgery

## 2020-03-23 ENCOUNTER — Ambulatory Visit: Admit: 2020-03-23 | Payer: 59

## 2020-03-23 DIAGNOSIS — M48061 Spinal stenosis, lumbar region without neurogenic claudication: Principal | ICD-10-CM

## 2020-05-31 ENCOUNTER — Ambulatory Visit

## 2020-06-27 ENCOUNTER — Ambulatory Visit: Admitting: Internal Medicine

## 2020-07-11 ENCOUNTER — Ambulatory Visit

## 2020-12-03 ENCOUNTER — Ambulatory Visit: Admitting: Cardiovascular Disease

## 2021-04-22 ENCOUNTER — Encounter (EMERGENCY_DEPARTMENT_HOSPITAL): Admitting: Cardiovascular Disease

## 2021-06-20 ENCOUNTER — Ambulatory Visit: Admitting: Internal Medicine

## 2021-06-20 ENCOUNTER — Encounter (HOSPITAL_COMMUNITY): Admitting: Internal Medicine

## 2021-06-21 ENCOUNTER — Encounter (HOSPITAL_COMMUNITY): Admitting: Internal Medicine

## 2021-06-21 ENCOUNTER — Ambulatory Visit: Admitting: Internal Medicine

## 2021-06-21 NOTE — Consults (Signed)
 [facility]  [facility] Consultation Note  [address]   [place], [place]  [address]  [phone]        Patient: Garrett Allison, Garrett Allison Admission Date: 06/20/21  DOB: 08/29/2056 Medical Record Number: [id number]  Patient Status: ADM IN Account Number: [id number]  Attending of Record: Chun,Eric H MD (PO)          Service Date: 06/21/21        Subjective  Chief complaint:  Abd pain  Subjective:  Feeling better. Still has diarrhea. No abd pain. No SOB, pleurisy, cough, phlegm.     Objective  Vitals   I/O - Last 24hr:   Vital Signs         08/30 08/31      2320 0710     Temp 97.9 98.3     Pulse 65 70     Resp 20 18     B/P 131/77 115/67     B/P Mean       Pulse Ox 97 98     O2 Delivery ROOM AIR ROOM AIR     O2 Flow Rate       FiO2           Intake   Output         08/31 0700 08/31 1500 08/31 2300     Intake Total  425      Output Total        Balance  425               Intake, IV  250      Intake, Oral  175               Physical Exam  General: Alert, Oriented X3, Cooperative, No Acute Distress  Neurological: Cranial nerves 2-12, Strength  HEENT: Mucous membranes moist  Neck: JVP normal  Lungs: Breathing comfortably, decreased on right, no w/r/r  Cardiovascular: Regular rhythm  Abdomen: Soft, Mild tenderness, improved  Extremities: No edema     Results  Laboratory:   Laboratory Tests        Test Result Date Time     Chemistry         Sodium (135 - 145 MMOL/L) 132 L 08/31 0815       Potassium (3.5 - 5.1 MMOL/L) 4.3 08/31 0815       Chloride (96 - 104 MMOL/L) 100 08/31 0815       Carbon Dioxide (22 - 30 MMOL/L) 23 08/31 0815       Anion Gap (9 - 18 MMOL/L) 9 08/31 0815       BUN (8 - 21 MG/DL) 8 34/37 3578       Creatinine (0.67 - 1.17 MG/DL) 9.78 L 47/84 1282       Est GFR (African Amer) (>60 ML/MIN) > 60 08/31 0815       Estimated GFR (MDRD) (>60 ML/MIN) > 60 08/31 0815       Glucose (70 - 105 MG/DL) 98 08/13 8871       Lactic Acid (0.50 - 2.00 MMOL/L) 1.34 08/30 0905       Calcium (8.6 -  10.3 MG/DL) 8.1 L 95/97 4718       Phosphorus (2.7 - 4.5 MG/DL) 3.2 55/01 5868       Magnesium (1.4 - 2.4 MG/DL) 1.4 25/74 9355       Total Bilirubin (0.2 - 1.0 MG/DL) 1.2 H 21/74 7159  Direct Bilirubin (0.0 - 0.3 MG/DL) 0.7 H 94/58 5929       AST (10 - 44 U/L) 25 08/31 0815       ALT (10 - 44 U/L) 14 08/31 0815       Total Alk Phosphatase (25 - 165 U/L) 147 08/31 0815       Total Protein (6.2 - 8.4 G/DL) 6.3 24/46 2863       Albumin (3.4 - 4.8 G/DL) 2.8 L 81/77 1165       Lipase (0 - 60 U/L) 50 08/29 2250     Hematology         WBC (4.0 - 11.0 10*3/uL) 4.6 08/31 0815       RBC (4.50 - 5.90 10*6/uL) 3.12 L 08/31 0815       Hgb (14.0 - 18.0 G/DL) 79.0 L 38/33 3832       Hct (42.0 - 54.0 %) 33.7 L 08/31 0815       MCV (80.0 - 94.0 FL) 108.0 H 08/31 0815       MCH (27.0 - 32.0 PG) 35.3 H 08/31 0815       MCHC (32.0 - 36.0 G/DL) 91.9 16/60 6004       RDW (11.5 - 14.5 %) 15.5 H 08/31 0815       Plt Count (150 - 400 10*3/uL) 103 L 08/31 0815       Nucleat RBC Rel Count (/100WBC) 0.0 08/30 0905       Seg Neutrophils % (%) 72.7 08/30 0905       Lymphocytes % (%) 17.5 08/30 0905       Monocytes % (%) 9.0 08/30 0905       Eosinophils % (%) 0.0 08/30 0905       Basophils % (%) 0.4 08/30 0905       Seg Neutrophils # (2.4 - 8.1 10*3/uL) 3.3 08/30 0905       Lymphocytes # (1.0 - 4.0 10*3/uL) 0.8 L 08/30 0905       Monocytes # (0.0 - 1.5 10*3/uL) 0.4 08/30 0905       Eosinophils # (0.0 - 0.5 10*3/uL) 0.0 08/30 0905       Basophils # (0.0 - 0.1 10*3/uL) 0.0 08/30 0905     Other Body Source         Fluid Source PLEURAL FLD 08/31 0900       Fluid Volume (ML) 5.0 08/31 0900       Fluid Color BLOODY 08/31 0900       Fluid Turbidity 4+ 08/31 0900       Fluid WBC (CELLS/uL) 485 08/31 0900       Fluid RBC (CELLS/uL) 115000.0 08/31 0900       Fluid Neutrophils % (%) 50.0 08/31 0900       Fluid Neutrophils # (CELLS/uL) 242.5 08/31 0900       Fluid Lymphocytes # (CELLS/uL) 203.7 08/31 0900       Fluid Lymphocytes % (%) 42.0 08/31 0900        Fluid Monocytes # (CELLS/uL) 38.8 08/31 0900       Fluid Monocytes % (%) 8.0 08/31 0900       Fluid Glucose (MG/DL) 42 59/97 7414       Fluid Total Protein (G/DL) 4.3 23/95 3202       Fluid LDH (U/L) 515 08/31 0900       Fluid Comment . 08/31 0900  Serology         Influenza Type A RNA (NEGATIVE) NEGATIVE 08/30 0152       Influenza Type B RNA (NEGATIVE) NEGATIVE 08/30 0152       SARS Source NASOPHARYNGEAL 08/30 0152       SARS-CoV-2 (PCR) (NEGATIVE) NEGATIVE 08/30 0152           Medications:             Sig/Sch Start time  Last     Medication Dose Route/Reason Stop Time Status Admin     Acetaminophen 650 MG Q4HPRN PRN 08/30 0300 AC 08/30       PO   2012       FEVER        Folic Acid 1 MG DAILY 08/31 0900 AC 08/31       PO   0852               Furosemide 40 MG DAILY 08/31 0900 AC 08/31       PO   0851               Gabapentin 300 MG TID 08/30 2000 CKD 08/31       PO   1322               Multivitamins/ 1 TAB DAILY 08/31 0900 AC 08/31     Minerals Therapeutic  PO   0851               Nicotine 21 MG DAILY 08/30 0900 CKD 08/31       TD   0852               Propranolol HCl 20 MG TID 08/30 2000 AC 08/31       PO   1323               Spironolactone 50 MG BID 08/30 2100 AC 08/31       PO   0852               Thiamine HCl 100 MG DAILY 08/31 0900 AC 08/31       PO   0852               Vancomycin HCl 125 MG QID 08/31 1400 AC 08/31       PO 09/10 1659  1450                           Assessment and Plan  Attending Note:  Pleural effusion:  -Now appearing more bloody. With history of fall from bed, rib fractures, etc., may've been  a PleurX for parapneumonic effusion and hemothorax, not hepatic hydrothorax which would be   a little unusual. Does not seem to have infected pleural space or active pleural bleed now.  -Ongoing drainage by thoracic here and by [facility] as outpatient.  -Pleural cx pending but infection there seems unlikely.  -Outpt hepatology f/u to manage liver disease and related fluid status.  -Hopefully PleurX can  be pulled as outpt once no longer draining.     Electronically Signed by Allena Katz MD (PO) on 06/21/21 at 1632     <Electronically signed by Allena Katz, MD, (PO)>        Date/Time:06/21/21 [phone]  Date/Time:  -------------------------------------------------------------------------------  Electronically Signed by       Transcribed by: Vernetta Honey D T: 06/21/21 1628            Report #: [id number]       Signed D T: 06/21/21 1632     Copies to:      Patient account number:  [id number]  Ordering physician:  ,    Other providers:  Kathrene Alu,  , Phillips Odor,  ,  ,

## 2021-06-22 ENCOUNTER — Encounter (HOSPITAL_COMMUNITY): Admitting: Internal Medicine

## 2021-06-22 ENCOUNTER — Ambulatory Visit: Admitting: Internal Medicine

## 2021-06-22 NOTE — Consults (Signed)
 [facility]  [facility] Consultation Note  [address]   [place], [place]  [address]  [phone]        Patient: Garrett Allison, Garrett Allison Admission Date: 06/20/21  DOB: 26-Dec-2055 Medical Record Number: [id number]  Patient Status: ADM IN Account Number: [id number]  Attending of Record: Chun,Eric H MD (PO)          Service Date: 06/22/21        Subjective  Chief complaint:  Abd pain  Subjective:  Abd pain better. No SOB, pleurisy, cough.     Objective  Vitals   I/O - Last 24hr:   Vital Signs         09/01 09/01 09/01  09/01 09/01      0005 0715 1109 1425 1500     Temp 97.4 97.6  98.3 97.9     Pulse 67 58  70 65     Resp 18 18  18 16      B/P 115/65 125/72  120/68 117/70     B/P Mean          Pulse Ox 96 99 98 98 98     O2 Delivery ROOM AIR ROOM AIR ROOM AIR ROOM AIR ROOM AIR     O2 Flow Rate          FiO2              Intake   Output         09/01 0700 09/01 1500 09/01 2300     Intake Total  740      Output Total  550      Balance  190               Intake, Oral  740      Output, Urine  550               Physical Exam  General: Alert, Oriented X3, Cooperative, No Acute Distress  Neurological: Cranial nerves 2-12, Strength  HEENT: Mucous membranes moist  Neck: JVP normal  Lungs: Breathing comfortably, decreased on right but aeration better, no w/r/r  Cardiovascular: Regular rhythm  Abdomen: Soft, Nontender  Extremities: No edema     Results  Laboratory:   Laboratory Tests        Test Result Date Time     Chemistry         Sodium (135 - 145 MMOL/L) 132 L 09/01 0742       Potassium (3.5 - 5.1 MMOL/L) 4.9 09/01 0742       Chloride (96 - 104 MMOL/L) 98 09/01 0742       Carbon Dioxide (22 - 30 MMOL/L) 24 09/01 0742       Anion Gap (9 - 18 MMOL/L) 10 09/01 0742       BUN (8 - 21 MG/DL) 5 L 61/16 4353       Creatinine (0.67 - 1.17 MG/DL) 9.12 L 25/83 4621       Est GFR (African Amer) (>60 ML/MIN) > 60 09/01 0742       Estimated GFR (MDRD) (>60 ML/MIN) > 60 09/01 0742       Glucose (70 - 105 MG/DL)  947 H 12/52 7129       Lactic Acid (0.50 - 2.00 MMOL/L) 1.34 08/30 0905       Calcium (8.6 - 10.3 MG/DL) 7.9 L 29/09 0301       Phosphorus (2.7 - 4.5 MG/DL) 3.2 49/96 9249  Magnesium (1.4 - 2.4 MG/DL) 1.4 37/00 5259       Total Bilirubin (0.2 - 1.0 MG/DL) 1.0 10/28 9022       Direct Bilirubin (0.0 - 0.3 MG/DL) 0.5 H 84/06 9861       AST (10 - 44 U/L) 30 09/01 0742       ALT (10 - 44 U/L) 14 09/01 0742       Total Alk Phosphatase (25 - 165 U/L) 149 09/01 0742       Total Protein (6.2 - 8.4 G/DL) 6.4 48/30 7354       Albumin (3.4 - 4.8 G/DL) 3.0 L 30/14 8403       Lipase (0 - 60 U/L) 50 08/29 2250     Hematology         WBC (4.0 - 11.0 10*3/uL) 5.0 09/01 0742       RBC (4.50 - 5.90 10*6/uL) 3.32 L 09/01 0742       Hgb (14.0 - 18.0 G/DL) 97.9 L 53/69 2230       Hct (42.0 - 54.0 %) 36.2 L 09/01 0742       MCV (80.0 - 94.0 FL) 109.0 H 09/01 0742       MCH (27.0 - 32.0 PG) 35.2 H 09/01 0742       MCHC (32.0 - 36.0 G/DL) 09.7 94/99 7182       RDW (11.5 - 14.5 %) 14.8 H 09/01 0742       Plt Count (150 - 400 10*3/uL) 148 L 09/01 0742       Nucleat RBC Rel Count (/100WBC) 0.0 08/30 0905       Seg Neutrophils % (%) 72.7 08/30 0905       Lymphocytes % (%) 17.5 08/30 0905       Monocytes % (%) 9.0 08/30 0905       Eosinophils % (%) 0.0 08/30 0905       Basophils % (%) 0.4 08/30 0905       Seg Neutrophils # (2.4 - 8.1 10*3/uL) 3.3 08/30 0905       Lymphocytes # (1.0 - 4.0 10*3/uL) 0.8 L 08/30 0905       Monocytes # (0.0 - 1.5 10*3/uL) 0.4 08/30 0905       Eosinophils # (0.0 - 0.5 10*3/uL) 0.0 08/30 0905       Basophils # (0.0 - 0.1 10*3/uL) 0.0 08/30 0905     Other Body Source         Fluid Source PLEURAL FLD 08/31 0900       Fluid Volume (ML) 5.0 08/31 0900       Fluid Color BLOODY 08/31 0900       Fluid Turbidity 4+ 08/31 0900       Fluid WBC (CELLS/uL) 485 08/31 0900       Fluid RBC (CELLS/uL) 115000.0 08/31 0900       Fluid Neutrophils % (%) 50.0 08/31 0900       Fluid Neutrophils # (CELLS/uL) 242.5 08/31 0900        Fluid Lymphocytes # (CELLS/uL) 203.7 08/31 0900       Fluid Lymphocytes % (%) 42.0 08/31 0900       Fluid Monocytes # (CELLS/uL) 38.8 08/31 0900       Fluid Monocytes % (%) 8.0 08/31 0900       Fluid Glucose (MG/DL) 42 09/90 6893       Fluid Total Protein (G/DL)  4.3 08/31 0900       Fluid LDH (U/L) 515 08/31 0900       Fluid Comment . 08/31 0900     Serology         Influenza Type A RNA (NEGATIVE) NEGATIVE 08/30 0152       Influenza Type B RNA (NEGATIVE) NEGATIVE 08/30 0152       SARS Source NASOPHARYNGEAL 08/30 0152       SARS-CoV-2 (PCR) (NEGATIVE) NEGATIVE 08/30 0152           Medications:             Sig/Sch Start time  Last     Medication Dose Route/Reason Stop Time Status Admin     Acetaminophen 650 MG Q4HPRN PRN 08/30 0300 AC 09/01       PO   1903       FEVER        Folic Acid 1 MG DAILY 08/31 0900 AC 09/01       PO   0845               Furosemide 40 MG DAILY 08/31 0900 AC 09/01       PO   0845               Gabapentin 300 MG TID 08/30 2000 CKD 09/01       PO   1903               Multivitamins/ 1 TAB DAILY 08/31 0900 AC 09/01     Minerals Therapeutic  PO   0845               Nicotine 21 MG DAILY 08/30 0900 CKD 09/01       TD   0845               Propranolol HCl 20 MG TID 08/30 2000 AC 09/01       PO   1904               Spironolactone 50 MG BID 08/30 2100 AC 09/01       PO   1903               Thiamine HCl 100 MG DAILY 08/31 0900 AC 09/01       PO   0845               Vancomycin HCl 125 MG QID 08/31 1400 AC 09/01       PO 09/10 1659  1903                           Assessment and Plan  Attending Note:  Pleural effusion:  -Bloody, likely hemothorax from rib fractures. Now seems less likely to be from hepatic   hydrothorax.  -Ongoing drainage, outpt fu with Dr. Dareen Piano, VNA to drain at home.  -Outpt hepatology follow-up.  -No evidence active pleural infection.     Electronically Signed by Allena Katz MD (PO) on 06/22/21 at 2154     <Electronically signed by Allena Katz, MD, (PO)>        Date/Time:06/22/21  [phone]  Date/Time:  -------------------------------------------------------------------------------  Electronically Signed by       Transcribed by: Vernetta Honey D T: 06/22/21 2150            Report #: [id number]       Signed D T: 06/22/21 2155     Copies to:      Patient account number:  [id number]  Ordering physician:  ,    Other providers:  Kathrene Alu,  , Phillips Odor,  ,  ,

## 2021-06-23 ENCOUNTER — Ambulatory Visit: Admitting: Internal Medicine

## 2021-07-12 ENCOUNTER — Encounter

## 2021-07-12 ENCOUNTER — Encounter (HOSPITAL_BASED_OUTPATIENT_CLINIC_OR_DEPARTMENT_OTHER): Admitting: Cardiovascular Disease

## 2021-07-12 LAB — EKG NO CHARGE

## 2022-07-13 NOTE — H&P (Addendum)
Admission H&P EMR  DATE/TIME NOTE CREATED: 07/13/2022 15:05:18  ATTENDING PHYSICIAN:   Dr. Orvan July  CHIEF COMPLAINT:    Per EMS pt found on the ground in a very small apartment that was extremly  unkept w/ feces and trash everywhere. Pt states he fell out of bed yesterday  and has not been able to move  HISTORY OF PRESENT ILLNESS:      This is a 66 year old male with a past medical history of untreated  hepatitis C, cirrhosis, HTN, spinal stenosis (with associated neuropathy)and  recurrent ascites who presents to the emergency department via EMS after  falling out of bed 2 days ago.  He was on the ground for approximately two  days and was unable to call for help  as he was feeling weak and was unable to  find his phone until this morning when he called EMS for help. Apparently, he  fell off his bed but he mentioned that he did not have a head strike and he  did not loose consciousness. During this time he had 3 episodes of  hematemesis, accompanied by heartburn 2/2 acid reflux that was still present  on the time I saw the patient.      He also complains of diffuse abdominal pain and reports 1 episode of loose  BM (non bloody) this morning when he was still on the ground.  No hematochezia  or melena. Of note, the patient has a h/o prior similar presentations with the  last one being in August 2023, when a paracentesis took place and 13.3L of  clear liquid was removed. He denied having fever or chills, he denied sick  contacts or any recent travel.      ED course:      Vitals summary: BP 130/80, tachycardic with a HR at 123, RR at 20,  saturating well on RA (97%), and he was afebrile      Lab work up today was remarkable for slight anemia with a RBC at 3.99, H/  at 13.9/40.5 and thrombocytopenia with PLT count at 70 (all of the above seem  chronic, stable from last admission). Chemistry panel was remarkable for  hyponatremia with a Na at 133 (chronic hyponatremia), hypoalbunemia at 3, with  normal kidney (BUN/Cr.  14/0.59) and slightly impaired liver function with  normal ALT at 24 and AST at 78, that could be explained by the pt's alcohol  use. Chronic elevated bili at 3.4 this time and ALP at 128. CPK slighlty  elevated at 316, rhabdomyolysis is less likely at this point. Troponin  slightly elevated at 26 but downtrending with a second one at 24., pt denied  chest pain most likely demand ischemia.      - In the ED the pt was started on IVF with NS      - EKG in the ED showed: Sinus tachycardia , with abnormal R progression,  but no ST-T changes. HR 116, PR at 131,and QTc 456      -CXR findings: Lungs are hypoinflated. No focal airspace consolidation.  Cardiomediastinal silhouette, hilar contours and pulmonary vasculature are  within normal limits. There is blunting of the right costophrenic sulcus. No  pneumothorax. No acute bony abnormality.  Multiple bilateral chronic rib  fractures noted.      - US guided paracentesis yielded 13L of clear straw-colored ascites. After  that, the patient was started on 50g of albumin      Social history:      - The patient  currently smoking a couple cigarettes daily, but used to  smoke much more over the past years (unclear)      - The patient quit alcohol drinking 2 weeks ago (couldn't afford it), but  used to drink 6 packs of beer twice weekly.      - The patient denied the use of any substances (based on the records  though he used to snort cocaine)  REVIEW OF SYSTEMS:      Pt mentioned heartburn. Denied having chest or abdominal pain. Denied  fever, chills, SOB, dyspnea, urinary symptoms or bowel irregularities.  PHYSICAL EXAM:   VITAL SIGNS:        Vital Signs:        Last Charted:        24 Hr Minimum:        24 Hr Maximum:        Temperature Oral        36.8        (09/22 06:51)        36.8        (09/22 06:51)        36.8        (09/22 06:51)        Heart Rate        111 (H)        (09/22 14:48)        111 (H)        (09/22 14:48)        123 (H)        (09/22 06:51)         Respiratory Rate        20        (09/22 06:51)        20        (09/22 06:51)        20        (09/22 06:51)        Systolic BP        139        (09/22 14:48)        130        (09/22 06:51)        139        (09/22 14:48)        Diastolic BP        81        (16/10 14:48)        80        (09/22 06:51)        81        (09/22 14:48)        Mean Arterial Pressure        97        (09/22 06:51)        97        (09/22 06:51)        97        (09/22 06:51)        SpO2/Pulse Oximetry        95        (09/22 14:48)        95        (09/22 14:48)        97        (09/22 06:51)      GENERAL: No acute distress, non-toxic appearing.  He has feces on both of  his hands and arms.      HEAD: Normal  with no signs of head trauma.  No cephalohematoma.      EYES: PERRLA, EOMI, conjunctiva normal, no discharge.  No raccoon eyes.      ENT: Hearing grossly intact, normal oropharynx.  No battle signs.      NECK: Supple, no tenderness, no lymphadenopathy, no masses, no  thyromegaly, no bruits, no JVD.      LUNGS: Clear breath sounds bilaterally.  No wheezes, rales, or rhonchi.      HEART: Regular rate and rhythm. Normal S1 and S2, without murmurs, rub or  gallop.      VASC: No edema. Peripheral pulses normal and equal in all extremities.      ABD: Moderate distention and a palpable fluid wave.  Diffusely tender  abdomen.  No rebound or guarding.  Bowel sounds are diminished.      EXT: Normal range of motion, no joint swelling, no clubbing, no cyanosis.  3x pitting edema      SKIN: No rashes or lesions.      NEURO: Alert and oriented x 3. Normal affect. Cranial nerves intact.  Neuropathy in bilateral lower extremities  ASSESSMENT/PLAN:       Ascites (R18.8)       Elevated troponin (R77.8)       Orders:         Bedrest         Complete Blood Count With Auto Differential         Comprehensive Metabolic Panel         Magnesium Level         NPO         Partial Thromboplastin Time         Prothrombin Time         Resuscitation Status          Routine Vital Signs         Telemetry Class I (72 hrs)         Up Ad Lib      Briefly, this is a 66 year old male with a pmh of untreated hepatitis C,  cirrhosis, and recurrent ascites who presents to the emergency department via  EMS after falling out of bed 2 days ago; while having 3 episodes of  hematemesis during this time      #Ascites      #Bloody vomiting      #Cirrhosis/Liver failure      #Chronic hepatitis C      -This is a 66 year old male with a past medical history of liver failure  and recurrent ascites who presents to the emergency department via EMS after  falling out of bed 2 days ago      - He was on the ground for approximately two days since he was feeling too  weak to get up and could not find his cell phone to call for help. During this  time he states 3 episodes of bloody vomiting along with acid refly symptoms      - He says that this morning he was able to find his phone underneath him  and was able to call for EMS.  He says that he did not strike his head and  didnt loose consciousness.      - He also complains of generalized abdominal pain and reports 1 episode of  loose BM (non bloody) this morning when he was still on the ground. But no  leukocytosis, and the patient is afebrile so  infection is less likely at this  point      - EKG in the ED showed: Sinus tachycardia , with abnormal R progression,  but no ST-T changes. HR 116, PR at 131,and QTc 456. Even though, troponin  levels were slightly elevated (26>24) this is most likely demand ischemia      -CXR findings: Lungs are hypoinflated. No focal airspace consolidation.  Cardiomediastinal silhouette, hilar contours and pulmonary vasculature are  within normal limits. There is blunting of the right costophrenic sulcus. No  pneumothorax. No acute bony abnormality.  Multiple bilateral chronic rib  fractures noted.      - US guided paracentesis yielded 13L of clear straw-colored ascites. After  that, the patient was started on 50g of  albumin. -Serum ascites albumin  gradient at 2.2, so portal HTN is the most likely cause of ascites.      - Ascitic fluid labs showed low WBC at 59, so SBP is less likely at this  point. In addition, pt doesnt have any signs of infection (no leucocytosis,  afebrile). His tachycardia, was most likely attributed to pt's dehydration  status      - Regarding, the three episodes of bloody vomiting (per patient); since  admission the patient is hemodynamically stable with BP   130's/80's, with no  signs of active bleeding and his H/H is stable at 13.9/40.5--> GI consulted,  non urgent endoscopy at this point. Of note, the patient didn't have another  episode of vomiting since his admission      Plan      - NPO      - Hydration with IVF  on very slow rate at 40cc/hr      - IV PPI      - GI consult for possible endoscopy      - Start octreotide drip to prevent esophageal bleeding      - If hepatic encephalopathy symptoms start to develop--> consider  measuring NH3 and start lactulose      - Trend H/H with CBC twice daily and monitor for active bleeding per GI.      #Tachycardia      - The pt presented to the ED with a HR at 123, most likely he was  dehydrated      Plan      -C/w gentle hydration with NS at 40cc/hr      -Monitor vitals      Chronic issues      #Low extremities neuropathy i/s/o spinal stenosis      #Chronic Hepatitis C      #HTN      #Alcohol use disorder      Plan      - Given the risk of hypotension we will hold off home furosemide,  propranolol and spironolactone      - C/w home folic acid, rosuvastatin, thiamine and gabapentin      - Monitor for withdrawal symptoms      - f/u EtOH levels      CODE STATUS: Full code      Contact: [HCP/NOK] N/A      DVT PROPHYLAXIS: Non indicated      GI PROPHYLAXIS: IV PPI      Foley/Lines/Restrains: PIV      Disposition: Med/Surg      House officer team: Chilton Si team]      PCP: N/A      Specialist: Dr.Aziz      Pharmacy: [facility]        Above plan has been  discussed  with attending, Dr. Orvan July  ATTESTATION:   I attest that I evaluated and examined the patient.  I reviewed and discussed  the case with the resident and agree with the resident's findings and plan of  care as documented above.   Problem List:   Hematemesis   Found Down   Decompensated Liver Cirrhosis C/B ascites   Thrombocytopenia   Dehydration   In brief, this is a 66 year old male with past medical history of untreated  hep C, liver failure with recurrent ascites, hypertension who presented after  being found down after 2 days.  Patient reportedly fell off his bed and was  unable to call anyone however he eventually found his phone after 2 days and  called for help.  He reports having hematemesis x3.  His hemoglobin appears  stable however he may be hemoconcentrated.  He underwent paracentesis with 13  L removed, he is getting albumin 50 g for this.  His ascites fluid does not  appear to have any active infection.  We will initiate him on ceftriaxone for  SBP prophylaxis, octreotide for possible variceal bleed and PPI twice daily.  We will keep him n.p.o. pending possible EGD and start a very low rate of IV  fluids in order to hydrate him as he was down for 2 days and is tachycardic  likely from dehydration.  We will continue to monitor his clinical course  closely.  Follow-up GI recommendations.   I confirmed that the patient's advance care plan is present, code status is  documented, or surrogate decision maker or surrogate decision maker is listed  in the patient medical record. I have utilized all available immediate  resources to obtain, update, or review the patient's current medications.   This note was generated with a voice recognition program. Please excuse any  errors which may have been overlooked during review. Sometimes, these errors  may affect the content or meaning of a given sentence.  PROBLEM LIST/PAST MEDICAL HISTORY:   Ongoing      Ascites (Medical)   Historical      No  qualifying data    SOCIAL HISTORY:     Home/Environment      Human Trafficking Red Flags None., 04/30/2022     Smoking/Tobacco Use      Smoking tobacco use: Former smoker., 04/30/2022     Substance Abuse      Use: Denies use., 04/30/2022  MEDICATIONS:   Home Medications (0)   Medication by Hx Documentation is pending.     Medications (4) Active   Scheduled: (2)   cefTRIAXone  1 g, IV Piggyback, Q24H-int   pantoprazole  40 mg = 10 mL, IV Push, Q12hr     Continuous: (2)   octreotide IV additive 500 mcg [25 mcg/hr] + NS 0.9% 500 mL  500 mL, IV, 25  mL/hr   NS 0.9% 1,000 mL  1,000 mL, IV, 40 mL/hr     PRN: (0)    ALLERGIES:       Allergies (1) Active       Reaction       No Known Medication Allergies       None Documented  LAB RESULTS:       Hematology Basic - Last 36 hours (21)       Result       Date/Time       WBC       6.9        09/22 07:55  RBC       3.99 (L)        09/22 07:55       Hgb       13.9 (L)        09/22 07:55       Hct       40.5 (L)        09/22 07:55       MCV       101.5 (H)        09/22 07:55       MCH       34.8 (H)        09/22 07:55       MCHC       34.3        09/22 07:55       RDW       13.8        09/22 07:55       Platelet Count       70 (L)        09/22 07:55       NRBC Auto Abs       0.00        09/22 07:55       NRBC Auto Rel       0.0        09/22 07:55       Neutrophil Rel       81.2        09/22 07:55       Lymphocyte Rel       7.6        09/22 07:55       Monocyte Rel       10.5        09/22 07:55       Eosinophil Rel       0.0        09/22 07:55       Basophil Rel       0.1        09/22 07:55       Neutrophil Abs       5.58        09/22 07:55       Lymphocyte Abs       0.52 (L)        09/22 07:55       Monocyte Abs       0.72        09/22 07:55       Eosinophil Abs       0.00        09/22 07:55       Basophil Abs       0.01        09/22 07:55         Chemistry Comprehensive - Last 36 hours (16)       Result       Date/time       Sodium Lvl       133 (L)        09/22 07:55        Potassium Lvl       4.4        09/22 07:55       Chloride Lvl       97        09/22 07:55       CO2       22  09/22 07:55       Glucose Level       86        09/22 07:55       BUN       14        09/22 07:55       Creatinine Lvl       0.59 (L)        09/22 07:55       AGAP       14        09/22 07:55       Total Protein       6.5        09/22 07:55       Albumin Lvl       3.0 (L)        09/22 07:55       Calcium Lvl       8.2 (L)        09/22 07:55       Alk Phos       128        09/22 07:55       AST       78 (H)        09/22 07:55       ALT       24        09/22 07:55       Lipase Lvl       29        09/22 07:55       Bili Total       3.4 (H)        09/22 07:55    RADIOLOGY/DIAGNOSTIC RESULTS:   Radiology - Last 36 hours (2)   [place] Abd Paracentesis W Image Guidance (09/22 11:48)   IMPRESSION:   Uncomplicated ultrasound guided paracentesis yielding 13 L of clear  straw-colored ascites.   40 cc of fluid was collected for diagnostic purposes and sent with the  patient back to the emergency room as discussed the hospitalist. It was  understood that the hospitalist will administer 50 g of albumin to the patient     XR Chest 1 View Portable (09/22 07:42)   FINDINGS: Lungs are hypoinflated. No focal airspace consolidation.  Cardiomediastinal silhouette, hilar contours and pulmonary vasculature are  within normal limits. There is blunting of the right costophrenic sulcus. No  pneumothorax. No acute bony abnormality.  Multiple bilateral chronic rib  fractures noted.   IMPRESSION:   Hypoinflation. Trace right pleural effusion.     Diagnostics - Non-Radiology (1)   ED EKG-CernerCV (09/22 06:40)   Please review the final report in the patient's chart.  Electronically Signed On 07/13/22 15:14 EDT  _____________________________________________   Ellison Hughs MD, [facility]    Electronically Signed On 07/13/22 17:37 EDT  _____________________________________________   Orvan July MD, [facility]    Patient account number:   [id number]  Ordering physician:  ,    Other providers:  Meda Klinefelter, CATHERINE HORWITZ, KIMBERLY NKEMAKOLAM, Saree Krogh  Wylene Men,  ,  ,

## 2022-07-18 NOTE — Progress Notes (Addendum)
Progress Note EMR  DATE/TIME NOTE CREATED: 07/18/2022 18:41:37  SUBJECTIVE:      24hs event:      -NAE overnight      -Today the patient alerted and NAD.   REVIEW OF SYSTEMS:        Denies palpitation, SOB, cough, diarrhea, dysuria, HA or confusion.  OBJECTIVE:   VITAL SIGNS:        Vital Signs:        Last Charted:        24 Hr Minimum:        24 Hr Maximum:        Temperature Oral        38.2 (H)        (09/27 15:44)        37.0        (09/27 00:21)        38.2 (H)        (09/27 15:44)        Heart Rate        72        (09/27 15:44)        72        (09/27 15:44)        98        (09/27 00:21)        Respiratory Rate        22 (H)        (09/27 15:44)        20        (09/27 00:21)        22 (H)        (09/26 19:26)        Systolic BP        96        (96/29 15:44)        96        (09/27 15:44)        107        (09/26 19:26)        Diastolic BP        92 (H)        (09/27 15:44)        65        (09/27 00:21)        92 (H)        (09/27 15:44)        Mean Arterial Pressure        93        (09/27 15:44)        76        (09/27 00:21)        93        (09/27 15:44)        Blood Pressure Location        Left, [place]        (09/27 15:44)          Activity Supine Clin Doc        Bedrest        (09/27 15:44)          SpO2/Pulse Oximetry        98        (09/27 15:44)        97        (09/27 08:00)        100        (09/27 00:21)        Oxygen Percent  98        (09/27 15:44)        98        (09/27 15:44)        99        (09/26 19:26)   PHYSICAL EXAM:       GENERAL:  No acute distress, non-toxic appearing.       HEAD: Normal with no signs of head trauma.       EYES:  PERRLA, EOMI, conjunctiva normal, no discharge       ENT:  Hearing grossly intact, normal oropharynx.       NECK:  Supple, no tenderness, no lymphadenopathy, no masses, no  thyromegaly, no bruits. Mild JVD       LUNGS:  Clear breath sounds bilaterally.  No wheezes, rales, or rhonchi.       HEART:  Regular rate and rhythm.  Normal S1 and S2, without  murmurs, rub,  or gallop.       VASC:  No edema. Peripheral pulses normal and equal in all extremities.       ABD:  Moderately distended abdomen with tenderness. Fluid wave positive.  Decreased bowel sound in all quadrant.       EXT:  Normal range of motion, no joint swelling, no clubbing, no  cyanosis. Decreased strength 3/5, BLLE.       SKIN:  No rashes or lesions.       NEURO:  Alert and oriented x 3. Normal affect.  Cranial nerves intact.  No focal sensory or strength deficits.  Reflexes symmetric.   LAB RESULTS:        Hematology Basic - Last 36 hours (21)        Result        Date/Time        WBC        3.5 (L)         09/27 05:53        RBC        3.48 (L)         09/27 05:53        Hgb        12.4 (L)         09/27 05:53        Hct        35.0 (L)         09/27 05:53        MCV        100.6 (H)         09/27 05:53        MCH        35.6 (H)         09/27 05:53        MCHC        35.4         09/27 05:53        RDW        13.4         09/27 05:53        Platelet Count        50 (L)         09/27 05:53        NRBC Auto Abs        0.00         09/27 05:53        NRBC Auto Rel        0.0  09/27 05:53        Neutrophil Rel        69.0         09/27 05:53        Lymphocyte Rel        11.9         09/27 05:53        Monocyte Rel        16.5         09/27 05:53        Eosinophil Rel        1.4         09/27 05:53        Basophil Rel        0.6         09/27 05:53        Neutrophil Abs        2.43         09/27 05:53        Lymphocyte Abs        0.42 (L)         09/27 05:53        Monocyte Abs        0.58         09/27 05:53        Eosinophil Abs        0.05         09/27 05:53        Basophil Abs        0.02         09/27 05:53          Chemistry Comprehensive - Last 36 hours (19)        Result        Date/time        Sodium Lvl        130 (L)         09/27 05:53        Potassium Lvl        3.4 (L)         09/27 05:53        Chloride Lvl        98         09/27 05:53        CO2        26         09/27 05:53         Glucose Level        108 (H)         09/27 05:53        BUN        10         09/27 05:53        Creatinine Lvl        0.57 (L)         09/27 05:53        AGAP        6 (L)         09/27 05:53        Total Protein        4.6 (L)         09/26 06:16        Albumin Lvl        2.9 (L)         09/26 06:16        Calcium Lvl        7.4 (L)  09/27 05:53        Alk Phos        70         09/26 06:16        AST        48 (H)         09/26 06:16        ALT        20         09/26 06:16        Magnesium Lvl        1.4         09/27 05:53        Phosphate        1.7 (L)         09/27 05:53        Bili Total        1.4 (H)         09/26 06:16        Bili Direct        0.7 (H)         09/26 06:16        Bili Indirect        0.7         09/26 06:16     RADIOLOGY/DIAGNOSTIC RESULTS:    Radiology - Last 36 hours (0)    No results in past 36 hours      Diagnostics - Non-Radiology (1)    ED EKG-CernerCV (09/22 06:40)    Please review the final report in the patient's chart.    ASSESSMENT/PLAN:       Ascites (R18.8)       Elevated troponin (R77.8)       Esophagitis (K20.90)       Gastritis (K29.70)       Mucosal abnormality of duodenum (K31.9)       Varices of esophagus determined by endoscopy (I85.00)       Orders:         Telemetry Class III (24 hrs)      This is a 66 year old male with a PMHx of untreated hepatitis C,  cirrhosis, and recurrent ascites who presents to the emergency department via  EMS after falling out of bed 2 days ago associated with 3 episode of  hematemesis 2/2 severe esophagitis and ulcer with distal esophageal varices.  Will be admitted for further management.      #Acute UGIB 2/2 severe esophagitis (grade D) and ulceration with small  distal esophageal varices s/p EGD 07/14/22.      #Gastritis      #Cirrhosis/Liver failure complicated by portal hypertension with recurrent  abdominal ascites and esophageal varices likely 2/2 untreated Hep C and  chronic alcohol use disorder.      - He was on the  ground for approximately two days, 3 episodes of bloody  vomiting along with acid reflux symptoms      - EKG in the ED showed: Sinus tachycardia , with abnormal R progression,  but no ST-T changes. HR 116, PR at 131,and QTc 456. Even though, troponin  levels were slightly elevated (26>24) this is most likely demand ischemia      - US guided paracentesis yielded 13L of clear straw-colored ascites. Pt  was given 50g of albumin. Serum ascites albumin gradient at 2.2, so portal HTN  is the most likely cause of ascites.      - Since admission, the patient is hemodynamically stable with BP  130's/80's, with no signs of active bleeding and his H/H is stable at  13.9/40.5, no further vomiting.      - Platelet count is stable 42k, no need for transfusion at this time.      - Dr. Teena Dunk performed an endoscopy on 07/14/22 which showed severe  esophagitis and ulceration. Varicies in distal esophagus. Sm intestine  biopsies taken. Patient will need to continue with high dose PPI indefinitely,  take carafate for 14d, resume CTX for SBP ppx x 7 days and transition to PO  abx upon discharge. No PVT on US doppler. If no further bleeding in 5 days  could consider low dose nadolol 20mg  daily for esophageal varices. Pt will f/u  with Dr. Marnette Burgess as outpatient in 2 weeks.      -We DC the octreotide 07/13/22-07/16/22, currently she is on lasix 20 mg  and spironolactone 50 mg.      - On 07/16/22, the patient had paracentesis with 12 L of drain.      - PTOT recommended STR.      -AFP 3.2, negative ANA, reactive Hep B ab, ASCB negative. IgG WNL.      Plan      - advanced to low sodium soft diet, and ordered albumin 20 mg daily for  three days (07/18/22 was last dose)      - Continue with soft GI diet until the next outpatient follow up in 2  weeks.      - continue high dose IV PPI indefinitely      - continue with Carafate to 2g TID x 14 days (9/23- )      - continue CTX 1g daily x 7 days for SBP ppx (9/22-9/28), then transition  to PO,  consider cipro 500 mg daily as outpatient. We started nadolol 20 mg  daily on 07/18/22.      - monitor HH daily, and for signs of active bleeding      - Hydration with IVF on very slow rate at 40cc/hr      #Tachycardia (resolved)      - The pt presented to the ED with a HR at 123, most likely he was  dehydrated      -Currently The HR is 70-80s. We will continue with the fluids.      Plan      -C/w gentle hydration with NS at 40cc/hr      -Monitor vitals      Chronic issues      #Low extremities neuropathy i/s/o spinal stenosis      #Chronic Hepatitis C      #HTN      #Alcohol use disorder      Plan      - continue with spironolactone and lasix 07/15/22, per GI rec. We will  discuss to adjust the medication.      - C/w home folic acid, rosuvastatin, thiamine and gabapentin      - Monitor for withdrawal symptoms      CODE STATUS: Full code      Contact: [HCP/NOK] N/A      DVT PROPHYLAXIS: Non indicated      GI PROPHYLAXIS: IV PPI      Foley/Lines/Restrains: PIV      Disposition: Med/Surg      House officer team: Chilton Si team]      PCP: N/A      Specialist: Dr.Aziz      Pharmacy: [facility]      Dicussed with Dr.  Absalom.  ATTESTATION:   I was present with the resident during the entire interview and examination  of the patient. I personally interviewed the patient and repeated the exam. I  reviewed/revised the exam and discussed with the resident in regards to  assessment and plan as documented.   See resident's section for details.   Pt examined and chart reviewed.   All lab data and radiology reports reviewed.   Case discussed at length with the resident.   Final management plans were formulated under my supervision.   I confirmed that the patient's advance care plan is present, code status is  documented, or surrogate decision maker is listed in the patient's medical  record.   I have utilized all available immediate resources to obtain, update, or  reviewed the patient's current medications.   Time spent  >>50 mins  Electronically Signed On 07/18/22 18:41 EDT  _____________________________________________   Selena Batten MD, MOON SOO    Electronically Signed On 08/04/22 15:23 EDT  _____________________________________________   Suzie Portela MD, DENESE    Patient account number:  [id number]  Ordering physician:  ,    Other providers:  Meda Klinefelter, CATHERINE HORWITZ, KIMBERLY NKEMAKOLAM, Tomasa Dobransky  Wylene Men,  ,  ,

## 2022-07-19 NOTE — Progress Notes (Signed)
Progress Note EMR  DATE/TIME NOTE CREATED: 07/19/2022 06:31:21  DATE/TIME PATIENT SEEN:   06/18/2022 07.00  SUBJECTIVE:      Hospital Day: 7      ICU Day: 1      MV Day: NA      POD: NA      Hospital Course:      This is a 66 year old male with a PMHx of untreated hepatitis C,  decompensated liver cirrhosis (recurrent ascites, hepatic encephalopathy and  esophageal varices) who presents to the emergency department via EMS after  falling out of bed associated with 3 episodes of hematemesis secondary to  severe esophagitis and ulcer with distal esophageal varices.      The patient has a history of similar situations, the last being in August  2023 when 13.3L of clear liquid was removed through paracentesis.      On 9/22 had uncomplicated ultrasound guided paracentesis yielding 13 L of  clear straw-colored ascites.      On 9/23 had endoscopy: EGD with severe grade D esophagitis near to his  proximal esophagus with diffuse ulceration and no evidence of active bleeding.  Appears to have small esophageal varices as well. Diffuse gastritis, and  abnormal mucosa in his duodenum that was biopsied. Suspect bleeding is  secondary to his esophagitis more so than his varices. He was treated with  high dose PPI, octreotide infusion, sucralfate and ceftriaxone for SBP  prophylaxis. However since esophageal variceal bleeding, octreotide has been  discontinued since 9/24. It seems that he's been stable H/H since then and no  reported bleeding events.      On 9/25 had another paracentesis yielding 12 liters of straw-colored  ascitic fluid.      Nadolol started on 9/27      In summary he has required two large volume paracentesis both requiring  albumin administration; last one on 9/25.      Today patient has become hypotensive overnight and had fever in the  afternoon.      Symptom-wise he complains of abdominal pain and persistent loose stools.  He was administered a total 1250 cc NS overnight which showed improvement in  BP. CXR  did not show any consolidation; lactate levels 1.7      Shock's etiology seems to be septic but no clear source yet, probably SBP      24 Hour Events:      The patient was transferred to ICU in view of hypotension with possible  needed for vasopressors and suspected spontaneous bacterial peritonitis.      Subjective:      The patient was seen in his room today in the ICU. He is complaining of  mild diffuse pain abdomen and marked distension of abdomen. He has had 4  episodes of loose stools today. He is also complaining of irregular sleep.  Denies any fever, vomiting, yellowish discoloration of eyes, hemetemesis,  malena, hematochezia.   REVIEW OF SYSTEMS:       As per HPI, otherwise negative  OBJECTIVE:   VITAL SIGNS:        Vital Signs:        Last Charted:        24 Hr Minimum:        24 Hr Maximum:        Temperature Oral        36.7        (09/28 04:00)        36.7        (  09/28 04:00)        38.2 (H)        (09/27 15:44)        Heart Rate        61        (09/28 06:00)        61        (09/28 03:00)        107 (H)        (09/28 04:00)        Cardiac Rhythm        Normal sin        (09/28 06:00)          Respiratory Rate        13 (L)        (09/28 06:00)        13 (L)        (09/28 05:30)        22 (H)        (09/27 08:00)        Systolic BP        97        (16/10 06:00)        82 (L)        (09/28 05:30)        115        (09/28 04:00)        Diastolic BP        49 (L)        (09/28 06:00)        43 (L)        (09/27 23:53)        92 (H)        (09/27 15:44)        Mean Arterial Pressure        65        (09/28 06:00)        57        (09/28 05:30)        93        (09/27 15:44)        Blood Pressure Location        Left, Uppe        (09/27 19:58)          Activity Supine Clin Doc        Bedrest        (09/27 19:58)          SpO2/Pulse Oximetry        100        (09/28 06:00)        96        (09/27 19:58)        100        (09/28 03:00)        Oxygen Percent        96        (09/27 19:58)        96         (09/27 19:58)        98        (09/27 15:44)   PHYSICAL EXAM:       Vitals: Temperature: 36.7C,HR:61 bpm, RR: 16 bpm, BP: 100/52 mm Hg, Sp02:  100%RA       Lines: Midline in right arm, PIV in left arm, Purewick Catheter       Drips: 250 cc/hr 0.9% Normal Saline       MV Settings:  NA       Intake/Output:       Intake: 2100       Output: 0       24 Hour Balance: +2100       Whole Hospitalization Balance: +1650       Physical Exam:       -General appearance: alert and oriented x3, in no apparent acute  distress.       -Neurology: Normal tone, normal speech.       -HEAD: Head is normocephalic, no visible signs of head trauma, pupils  equally round and reactive to light.       -Neck: Supple, no tenderness, no lymphadenopathy, no masses, no  thyromegaly, no bruits. Mild JVD       -Lungs: Clear breath sounds without crackles or wheezes heard.       -Heart: Normal S1/S2 with regular rhythm and rate without any murmurs,  rubs or gallops.       -Abdomen: Distended abdomen with tenderness to palpation. Fluid wave  positive.       -Skin & Extremities: Full range of motion in all extremities, no lower  limb edema bilaterally, no clubbing or cyanosis.       -Signs of Liver Cell Failure: No scleral icterus, no gynecomastia, no  testicular atrophy, no palmar erythema, no Dupuytren contracture, no clubbing,  no spider angiomas, no asterixis.       Micro:       Blood Cultures: pending       Imaging:       9/28: CXR: final report pending       9/24 Abd [place]: Patent main portal vein and left and right portal veins.  Sludge within the gallbladder. Echogenic cirrhotic liver. No significant  splenomegaly. Large amount of ascites in the right lower quadrant and moderate  amount of ascites around the liver       9/22 CXR: Hypoinflation. Trace right pleural effusion.   LAB RESULTS:        Hematology Basic - Last 36 hours (21)        Result        Date/Time        WBC        3.0 (L)         09/28 05:58        RBC        3.25 (L)         09/28  05:58        Hgb        11.4 (L)         09/28 05:58        Hct        34.5 (L)         09/28 05:58        MCV        106.2 (H)         09/28 05:58        MCH        35.1 (H)         09/28 05:58        MCHC        33.0         09/28 05:58        RDW        14.1         09/28 05:58        Platelet Count  49 (L)         09/28 05:58        NRBC Auto Abs        0.00         09/28 05:58        NRBC Auto Rel        0.0         09/28 05:58        Neutrophil Rel        57.5         09/28 05:58        Lymphocyte Rel        17.2         09/28 05:58        Monocyte Rel        22.3         09/28 05:58        Eosinophil Rel        2.7         09/28 05:58        Basophil Rel        0.3         09/28 05:58        Neutrophil Abs        1.70 (L)         09/28 05:58        Lymphocyte Abs        0.51 (L)         09/28 05:58        Monocyte Abs        0.66         09/28 05:58        Eosinophil Abs        0.08         09/28 05:58        Basophil Abs        0.01         09/28 05:58          Chemistry Comprehensive - Last 36 hours (19)        Result        Date/time        Sodium Lvl        132 (L)         09/28 05:58        Potassium Lvl        3.4 (L)         09/28 05:58        Chloride Lvl        100         09/28 05:58        CO2        26         09/28 05:58        Glucose Level        100         09/28 05:58        BUN        12         09/28 05:58        Creatinine Lvl        0.73         09/28 05:58        AGAP        6 (L)         09/28 05:58        Total Protein  4.8 (L)         09/28 05:58        Albumin Lvl        2.9 (L)         09/28 05:58        Calcium Lvl        7.1 (L)         09/28 05:58        Alk Phos        68         09/28 05:58        AST        53 (H)         09/28 05:58        ALT        26         09/28 05:58        Lactic Acid        1.0         09/28 05:58        Magnesium Lvl        Not Performed         09/28 05:23        Phosphate        Not Performed         09/28 05:23        Bili Total         1.4 (H)         09/28 05:58        Bili Direct        TNP         09/28 05:23     RADIOLOGY/DIAGNOSTIC RESULTS:    Radiology - Last 36 hours (0)    No results in past 36 hours      Diagnostics - Non-Radiology (1)    ED EKG-CernerCV (09/22 06:40)    Please review the final report in the patient's chart.    ASSESSMENT/PLAN:       Ascites (R18.8)       Elevated troponin (R77.8)       Esophagitis (K20.90)       Gastritis (K29.70)       Mucosal abnormality of duodenum (K31.9)       Varices of esophagus determined by endoscopy (I85.00)        This is a 66 year old male with a PMHx of untreated hepatitis C,  cirrhosis, and recurrent ascites who presents to the emergency department via  EMS after falling out of bed associated with 3 episode of hematemesis  secondary to severe esophagitis and ulcer with distal esophageal varices.  Overnight with fever and hypotension, responding to IVF. ICU was consulted for  concern of shock. Shock's etiology seems to be septic but no clear source yet,  probably SBP. Patient is in ICU in view of hypotension which may require  pressor support and also a suspected SBP.        Neuro/Psych        - Patient is alert and oriented, no suspicion of encephalopathy.        - No focal neurologic signs.        - Hx of low extremities neuropathy i/s/o spinal stenosis        - Patient does complain of disturbed sleep. No flapping tremors,  confusion.        Plan:        - Continue monitoring, patient is not in hepatic encephalopathy  Pulmonary        - Patient is not in respiratory distress, no cough.        - Adequate oxygen saturation.        - CXR 9/27 preliminary interpretation does not show consolidations.        PLAN:        NAI        Cardiovascular        #Shock        - Patient was hypotensive overnight, 1250 cc NS were administered; BP  improved to 115/65.        - For now not requiring vasopressor but will consider Levophed if  needed.        - SBP is the most likely etiology.         Plan:        - Monitor BP        - Hold nadolol, spironolactone, furosemide        - Discontinue continuous 250cc/hr NS        - 250 cc NS bolus PRN if the patient is hypotensive        - Consider Levophed if needed today morning.        Renal/Fluids/Electrolytes        #Hyponatremia        - Most recent Na 132        - Renal function is adequate Cr 0.74 BUN 13        On 07/19/2022: K: 3.4, Calcium: 7.1, Albumin: 2.9, corrected Calcium: 8        On 09/27/203: Mg: 1.5, P: 1.4        Plan:        - Hold spironolactone and lasix        - 40 meq KCL IV        - 2 g Magnesium Sulfate IV        - 15mmol KPhosphate IV        - Renal Consult to rule out hepato-renal syndrome        - Start Midodrine 5 mg TID        GI/Nutrition        #Acute upper GI bleeding        - Secondary to severe esophagitis (grade D) and ulceration with small  distal esophageal varices s/p EGD 07/14/22.        - No new episodes of hematemesis since admission. Resolved        #Gastritis & esophagitis        - Esophagitis is most likely cause of hematemesis.        #Cirrhosis/Liver failure        - Complicated by portal hypertension with recurrent abdominal ascites  and esophageal varices likely 2/2 untreated Hep C and chronic alcohol use  disorder.        - AFP 3.2, negative ANA, reactive Hep B ab, ASCB negative. IgG WNL.        - Today patient is symptomatic, tenderness        #Suspicion of SBP        - Fever, abdominal pain yesterday. Currently afebrile        On 07/19/2022: WBC: 3, H/H: 11.4/34.5/Platelets: 49, Lactate: 1        - Ceftriaxone discontinued overnight and Zosyn started        Plan        - Regular diet        -  Continue high dose IV PPI        - Continue with Carafate to 2g TID x 14 days (9/23- )        - Continue Zosyn. Will follow ID recommendations as well.        - Repeat PT/INR prior to paracentesis        - Diagnostic and Therapeutic Paracentesis to be done today with goal to  remove around 4-5 Liters of fluid        - Albumin  25 g IV prior to paracentesis and then 50 g IV after  paracentesis        - C Difficile Ag and Toxin to be tested for        Hem/Onc        #Pancytopenia        WBC: 3        Platelets: 49        RBC: 3.25        H/H: 11.4/34.5        Plan:        - Trend CBC        ID        #SBP?        - Patient was hypotensive and had fever.        - Meets shock criteria, shock's etiology seems to be septic but no clear  source yet, probably SBP.        - Blood cultures obtained, pending results        - UA pending        - CXR 9/27 preliminary interpretation does not show consolidations.        Plan:        - ID consult for suspicion of SBP        - Start Zosyn for suspicion of SBP        ICU Checklist        -DVT prophylaxis: not indicated        -GI prophylaxis: high dose PPI        -Foley: no        -Lines: Midline Left, Right PIV        -Glucose controlled (goal < 180): yes        -Nutrition: GI soft diet        -Restraints renewal: N/A        -Antibiotics de-escalation: no        -IV meds transitioned to PO: N/A        -MV ventilation: N/A        -Laboratory for next AM: yes        -Code status: full code        -HCP: N/A        -HCP Updated: N/A        -HCP Invoked: N/A        -Disposition: ICU        Case discussed with Dr.Herald Vallin        Written by Neil Crouch MD PGY 1        ICU Intern        [phone]  ATTESTATION:   I reviewed the medical record and hospital course. I saw and examined the  patient. I reviewed the laboratory results and imaging studies. I confirmed  the subjective and objective elements of the note above. I formulated the  assessment and plan with the ICU team. I coordinated care with ID and GI.  Key  features: 62m, alcohol use disorder, untreated HCV, cirrhosis, portal  hypertension, esophageal varices, ascites, hepatic encephalopathy. Admitted  9/22 because of hematemesis. EGD revealed severe esophagitis and gastritis,  small esophageal varices. Rx pantoprazole and sucralfate. Paracentesis 9/22 13  L  drained. Ceftriaxone for SBP prophylaxis. Transferred to ICU because of low  BP, fever. Abdomen tender, diarrhea. IV NS, pip/tazo. Paracentesis today -  limit to 4-5 L. Use albumin pre and post procedure. Stop IV NS. May require  norepinephrine. AKI, will sort out hepatorenal syndrome Rx.     Critical care time 45 minutes  Electronically Signed On 07/19/22 12:06 EDT  _____________________________________________   Hennie Duos MD, ADITYA    Electronically Signed On 07/19/22 13:42 EDT  _____________________________________________   Jonell Cluck MD, Wiktoria Hemrick    Patient account number:  [id number]  Ordering physician:  ,    Other providers:  Brien Mates, CATHERINE HORWITZ, KIMBERLY NKEMAKOLAM, ANNIE CHEMMANUR  No PCP,  ,  ,

## 2022-07-19 NOTE — Progress Notes (Signed)
Progress Note EMR  DATE/TIME NOTE CREATED: 07/19/2022 03:49:31  SUBJECTIVE:      Date of Admission: 07/13/2022      Date of transfer to ICU: 07/19/2022 4:00 am      Primary Transfer Diagnosis:      - Shock      - Acute upper GI bleeding (resolved)      - Cirrhosis      - Severe esophagitis      Secondary Diagnosis:      - Gastritis      - Chronic Hepatitis C      - Alcohol use disorder      - HTN      - Low extremities neuropathy i/s/o spinal stenosis      Consultants involved in Care:      Dr. Darrel Hoover (GI)      Dr. Teena Dunk (GI)      Admission History:      For the details of the history of present illness, past medical history,  family history, social history, please refer to History & Physical written by  Dr. Ellison Hughs on behalf of Dr. Orvan July.      Hospital Course:      This is a 66 year old male with a PMHx of untreated hepatitis C,  decompensated liver cirrhosis (recurrent ascites, hepatic encephalopathy and  esophageal varices) who presents to the emergency department via EMS after  falling out of bed associated with 3 episodes of hematemesis secondary to  severe esophagitis and ulcer with distal esophageal varices.      The patient has a history of similar situations, the last being in August  2023 when 13.3L of clear liquid was removed through paracentesis.      On 9/22 had uncomplicated ultrasound guided paracentesis yielding 13 L of  clear straw-colored ascites.      On 9/23 had endoscopy: EGD with severe grade D esophagitis nearly to his  proximal esophagus with diffuse ulceration and no evidence of active bleeding.  Appears to have small esophageal varices as well. Diffuse gastritis, and  abnormal mucosa in his duodenum that was biopsied. Suspect bleeding is  secondary to his esophagitis more so than his varices. He was treated with  high dose PPI, octreotide infusion, sucralfate and ceftriaxone for SBP  prophylaxis. However since esophageal variceal bleeding, octreotide has been  discontinued since  9/24. It seems that he's been stable H/H since then and no  reported bleeding events.      On 9/25 had another paracentesis yielding 12 liters of straw-colored  ascitic fluid.      Nadolol started on 9/27      In summary he has required two large volume paracentesis both requiring  albumin administration; last one on 9/25.      Today patient has become hypotensive overnight and had fever in the  afternoon.      Symptom-wise he complains of abdominal pain and persistent loose stools.  He was administered a total 1250 cc NS overnight which showed improvement in  BP. CXR did not show any consolidation; lactate levels 1.7      Shock's etiology seems to be septic but no clear source yet, probably SBP.      Subjective      Upon interview, patient complains of weakness and abdominal pain. He  denies shortness of breath, chest pain, pain in her calves or arms, headache,  dizziness.  OBJECTIVE:   VITAL SIGNS:        Vital Signs:  Last Charted:        24 Hr Minimum:        24 Hr Maximum:        Temperature Oral        37.3        (09/27 19:58)        37.0        (09/27 06:07)        38.2 (H)        (09/27 15:44)        Heart Rate        61        (09/28 04:30)        61        (09/28 04:30)        107 (H)        (09/28 04:00)        Cardiac Rhythm        Normal sin        (09/28 04:30)          Respiratory Rate        14        (09/28 04:30)        14        (09/28 04:00)        22 (H)        (09/27 08:00)        Systolic BP        104        (09/28 04:30)        87 (L)        (09/27 19:58)        115        (09/28 04:00)        Diastolic BP        61        (16/10 04:30)        47 (L)        (09/27 19:58)        92 (H)        (09/27 15:44)        Mean Arterial Pressure        75        (09/28 04:30)        60        (09/27 19:58)        93        (09/27 15:44)        Blood Pressure Location        Left, Uppe        (09/27 19:58)          Activity Supine Clin Doc        Bedrest        (09/27 19:58)          SpO2/Pulse  Oximetry        100        (09/28 04:30)        96        (09/27 19:58)        100        (09/28 04:30)        Oxygen Percent        96        (09/27 19:58)        96        (09/27 19:58)        98        (09/27 15:44)  PHYSICAL EXAM:       -Most recent vital signs: Temperature 37.3 ,Heart rate 61, Respiratory  rate 14, Blood Pressure 104/61, O2 saturation 96% on room air .       -General appearance: alert and oriented x3, in no apparent acute  distress.       -Neurology: Normal tone, normal speech.       -HEAD: Head is normocephalic, no visible signs of head trauma, pupils  equally round and reactive to light.       -Neck: Supple, no tenderness, no lymphadenopathy, no masses, no  thyromegaly, no bruits. Mild JVD       -Lungs: Clear breath sounds without crackles or wheezes heard.       -Heart: Normal S1/S2 with regular rhythm and rate without any murmurs,  rubs or gallops.       -Abdomen: Distended abdomen with tenderness to palpation. Fluid wave  positive.       -Skin & Extremities: Full range of motion in all extremities, no lower  limb edema bilaterally, no clubbing or cyanosis.       Micro       9/28 Blood culture pending.       Imaging       9/24 Abd [place]: Patent main portal vein and left and right portal veins.  Sludge within the gallbladder. Echogenic cirrhotic liver. No significant  splenomegaly. Large amount of ascites in the right lower quadrant and moderate  amount of ascites around the liver       9/22 CXR: Hypoinflation. Trace right pleural effusion.     LAB RESULTS:        Hematology Basic - Last 36 hours (21)        Result        Date/Time        WBC        2.9 (L)         09/27 22:32        RBC        3.14 (L)         09/27 22:32        Hgb        11.0 (L)         09/27 22:32        Hct        32.1 (L)         09/27 22:32        MCV        102.2 (H)         09/27 22:32        MCH        35.0 (H)         09/27 22:32        MCHC        34.3         09/27 22:32        RDW        13.8         09/27  22:32        Platelet Count        49 (L)         09/27 22:32        NRBC Auto Abs        0.00         09/27 05:53        NRBC Auto Rel        0.0  09/27 05:53        Neutrophil Rel        69.0         09/27 05:53        Lymphocyte Rel        11.9         09/27 05:53        Monocyte Rel        16.5         09/27 05:53        Eosinophil Rel        1.4         09/27 05:53        Basophil Rel        0.6         09/27 05:53        Neutrophil Abs        2.43         09/27 05:53        Lymphocyte Abs        0.42 (L)         09/27 05:53        Monocyte Abs        0.58         09/27 05:53        Eosinophil Abs        0.05         09/27 05:53        Basophil Abs        0.02         09/27 05:53          Chemistry Comprehensive - Last 36 hours (20)        Result        Date/time        Sodium Lvl        131 (L)         09/28 01:40        Potassium Lvl        3.4 (L)         09/28 01:40        Chloride Lvl        100         09/28 01:40        CO2        24         09/28 01:40        Glucose Level        135 (H)         09/28 01:40        BUN        13         09/28 01:40        Creatinine Lvl        0.74         09/28 01:40        AGAP        7 (L)         09/28 01:40        Total Protein        4.7 (L)         09/28 01:40        Albumin Lvl        2.8 (L)         09/28 01:40        Calcium Lvl        7.2 (L)  09/28 01:40        Alk Phos        62         09/28 01:40        AST        51 (H)         09/28 01:40        ALT        24         09/28 01:40        Lactic Acid        1.7         09/28 01:40        Magnesium Lvl        1.5         09/27 22:32        Phosphate        1.4 (L)         09/27 22:32        Bili Total        1.1 (H)         09/28 01:40        Bili Direct        0.7 (H)         09/26 06:16        Bili Indirect        0.7         09/26 06:16     RADIOLOGY/DIAGNOSTIC RESULTS:    Radiology - Last 36 hours (0)    No results in past 36 hours      Diagnostics - Non-Radiology (1)    ED EKG-CernerCV  (09/22 06:40)    Please review the final report in the patient's chart.    ASSESSMENT/PLAN:       Ascites (R18.8)       Elevated troponin (R77.8)       Esophagitis (K20.90)       Gastritis (K29.70)       Mucosal abnormality of duodenum (K31.9)       Varices of esophagus determined by endoscopy (I85.00)        This is a 66 year old male with a PMHx of untreated hepatitis C,  cirrhosis, and recurrent ascites who presents to the emergency department via  EMS after falling out of bed associated with 3 episode of hematemesis  secondary to severe esophagitis and ulcer with distal esophageal varices.  Overnight with fever and hypotension, responding to IVF. ICU was consulted for  concerning of shock. Shock's etiology seems to be septic but no clear source  yet, probably SBP.        Neuro/Psych        - Patient is alert and oriented, no suspicion of encephalopathy.        - No focal neurologic signs.        - Hx of low extremities neuropathy i/s/o spinal stenosis        Plan:        - Continue monitoring, patient at risk of encephalopathy        Pulmonary        - Patient is not in respiratory distress, no cough.        - Adequate oxygen saturation.        - CXR 9/27 preliminary interpretation does not show consolidations.        Cardiovascular        #Shock        - Patient was hypotensive  overnight, 1250 cc NS were administered; BP  improved to 115/65.        - For now not requiring vasopressor but will consider Levophed if  needed.        - SBP is the most likely etiology.        Plan:        - Monitor BP        - Hold nadolol, spironolactone, furosemide        - Continue NS 250 cc/h        - Consider Levophed if needed today morning.        Renal/Fluids/Electrolytes        #Hyponatremia        - Most recent Na 132        - Renal function is adequate Cr 0.74 BUN 13        Plan:        - Hold antihypertensives and diuretics.        GI/Nutrition        #Acute upper GI bleeding        - Secondary to severe esophagitis  (grade D) and ulceration with small  distal esophageal varices s/p EGD 07/14/22.        - No new episodes of hematemesis since admission. Resolved        #Gastritis & esophagitis        - Esophagitis is most likely cause of hematemesis.        #Cirrhosis/Liver failure        - Complicated by portal hypertension with recurrent abdominal ascites  and esophageal varices likely 2/2 untreated Hep C and chronic alcohol use  disorder.        - AFP 3.2, negative ANA, reactive Hep B ab, ASCB negative. IgG WNL.        - Today patient is symptomatic, tenderness        #Suspicion of SBP        - Fever, abdominal pain yesterday.        Plan        - Continue high dose IV PPI        - Continue with Carafate to 2g TID x 14 days (9/23- )        - Start Zosyn for suspicion of SBP        - Discontinue ceftriaxone.        - Consider dx/therapeutic paracentesis today        - F/u lactic acid, CMP, LFT, CBC.        Hem/Onc        #Leukopenia        Plan:        - f/u CBC        ID        #SBP?        - Patient was hypotensive and had fever.        - Meets shock criteria, shock's etiology seems to be septic but no clear  source yet, probably SBP.        - Blood cultures obtained, pending results        - UA pending        - CXR 9/27 preliminary interpretation does not show consolidations.        Plan:        - ID consult for suspicion of SBP        - Start Zosyn for suspicion of SBP  ICU Checklist        -DVT prophylaxis: not indicated        -GI prophylaxis: high dose PPI        -Foley: no        -Lines: midline to be inserted today        -Glucose controlled (goal < 180): yes        -Nutrition: GI soft diet        -Restraints renewal: N/A        -Antibiotics de-escalation: no        -IV meds transitioned to PO: N/A        -MV ventilation: N/A        -Laboratory for next AM: yes        -Code status: full code        -HCP: N/A        -HCP Updated: N/A        -HCP Invoked: N/A        -Disposition: ICU        The details of the  transfer and follow-up plans have been discussed  thoroughly with the patient, nursing staff, Dr. Regino Schultze (attending) and Dr. Trisha Mangle  (PGY-3).        Luis O. Ferne Reus, MD PGY-1  ATTESTATION:   ATTENDING NOTE   I have reviewed medical records.   I have reviewed current and prior labs, images and tests.   HOME MEDICATIONS:   I have utilized all available immediate resources to obtain, update, or  review the patients current medications (including all prescriptions,  over-the-counter products, herbals, cannabis/cannabidiol products, and  vitamin/mineral/dietary (nutritional) supplements).   Code Status: FULL   (I have confirmed that the patient's ACP (Advance Care Plan) is present, CODE  STATUS is documented or surrogate decision maker is listed in the patient's  medical record.)     I have personally seen and evaluated the patient.     My findings are as follows {fever & low BP; regular rhythm;     FEVER, HYPOTENSION, GIB, CIRRHOSIS WITH ASCITES, ? SBP OR BACTERIAL  PERITONITIS (iv zosyn, iv levophed as indicated, ID&GI)}     I discussed and agreed on plan of care with (Resident).     critical care time spent, excluding teaching time, 35 min (inpatient).  Electronically Signed On 07/19/22 06:01 EDT  _____________________________________________   Odette Fraction MD, LUIS    Electronically Signed On 07/19/22 14:44 EDT  _____________________________________________   Regino Schultze MD, Nahir.Roan W    Patient account number:  [id number]  Ordering physician:  ,    Other providers:  Brien Mates, CATHERINE HORWITZ, KIMBERLY NKEMAKOLAM, ANNIE CHEMMANUR  No PCP,  ,  ,

## 2022-07-19 NOTE — Procedures (Signed)
Procedure Note EMR  PARACENTESIS NOTE  Risks and benefits discussed with patient and consent obtained  Anesthesia: 5 cc of Lidocaine  Patient positioned supine. Abdomen examined with ultrasound for appropriate  site placement in right lower quadrant. Site marked and prepared with swabs of  betadine. Site draped with sterile dressing. Wheel of lidocaine placed.  Lidocaine then introduced deep to the peritoneum. Skin punctured with a blade  scalpel. Paracentesis needle was placed through the skin into the abdominal  cavity.   Yellow colored fluid removed. The needle was withdrawn and the site  cleaned and bandaged with gauze and tape. Samples were sent to the lab for  analysis.  Amount of fluid removed: 4.2 L  Estimated Blood Loss: minimal  Complications:  The patient tolerated the procedure well without  complications.  Heriberto AntiguaAlexandra Lema Morocho, Allison PGY-3, under Dr. Charlesetta Allison supervision  I was present during the whole procedure.  Obtain my own consent with the  patient.  Some correction recon to do a paracentesis note.  Patient was supine  but in a more upright sitting position.  Slightly right decubitus.  Appropriate site in the right lower quadrant was marked under nonsterile  ultrasound guidance, sterile ultrasound guidance was not needed.  Skin was  cleaned with chlorhexidine which was included in the kit.  Patient was draped  in standard condition with drape circular at the site of marking and  additional sterile draping which extended the sterile field.  Skin and  paracentesis track was anesthetized with 1% lidocaine.  Small incision was  made with a scalpel before and 8 French paracentesis catheter was inserted  under negative pressure until ascitic fluid was withdraw with no blood noted.  Catheter was advanced without further advancing of the internal needle, and  the needle was then removed leaving the catheter only.  Fluid was withdrawn  with the provided syringe indicated.  1 way catheter tubing was attached  and  then hooked up to wall suctioning.  As mentioned above approximately 4.2 L of  ascitic fluid was removed.  Which appears slightly cloudy and yellow in  appearance.  There were no complications and minimal blood loss.  Patient  tolerated procedure well.  Dictated by:  Garrett Paganinihihheng Hsieh, Allison  PGY 3  Pager: [phone]  Available by AMS connect messenger.  Voice recognition software was used to generate this note.  Please excuse any  typographical errors or word substitution.  Electronically Signed On 07/19/22 12:26 EDT  _____________________________________________   Garrett Allison, Garrett    Electronically Signed On 07/20/22 11:08 EDT  _____________________________________________   Garrett GaribaldiHSIEH Allison, Garrett Allison    Patient account number:  [id number]  Ordering physician:  ,    Other providers:  Brien MatesENESE ABSALOM, CATHERINE HORWITZ, KIMBERLY NKEMAKOLAM, ANNIE CHEMMANUR  No PCP,  ,  ,

## 2022-07-20 NOTE — Progress Notes (Signed)
Progress Note EMR  DATE/TIME NOTE CREATED: 07/20/2022 06:48:17  DATE/TIME PATIENT SEEN:   07/20/2022 07.00  SUBJECTIVE:      SUBJECTIVE:      Hospital Day: 8      ICU Day: 2      MV Day: NA      POD: NA      Hospital Course:      This is a 66 year old male with a PMHx of untreated hepatitis C,  decompensated liver cirrhosis (recurrent ascites, hepatic encephalopathy and  esophageal varices), recurrent C-Difficile ( x2 in 2022 ) who presents to the  emergency department via EMS after falling out of bed associated with 3  episodes of hematemesis secondary to severe esophagitis and ulcer with distal  esophageal varices.      The patient has a history of similar situations, the last being in August  2023 when 13.3L of clear liquid was removed through paracentesis.      On 9/22 had uncomplicated ultrasound guided paracentesis yielding 13 L of  clear straw-colored ascites.      On 9/23 had endoscopy: EGD with severe grade D esophagitis near to his  proximal esophagus with diffuse ulceration and no evidence of active bleeding.  Appears to have small esophageal varices as well. Diffuse gastritis, and  abnormal mucosa in his duodenum that was biopsied. Suspect bleeding is  secondary to his esophagitis more so than his varices. He was treated with  high dose PPI, octreotide infusion, sucralfate and ceftriaxone for SBP  prophylaxis. However since esophageal variceal bleeding, octreotide has been  discontinued since 9/24. It seems that he's been stable H/H since then and no  reported bleeding events.      On 9/25 had another paracentesis yielding 12 liters of straw-colored  ascitic fluid.      Nadolol started on 9/27      In summary he has required two large volume paracentesis both requiring  albumin administration; last one on 9/25.      Today patient has become hypotensive overnight and had fever in the  afternoon.      Symptom-wise he complains of abdominal pain and persistent loose stools.  He was administered a total 1250  cc NS overnight which showed improvement in  BP. CXR did not show any consolidation; lactate levels 1.7      Shock's etiology seems to be septic but no clear source yet, probably SBP.  C. difficile is also a possibility. C-Difficile results were positive. It  appears that the low blood pressure was caused by C-Difficile Diarrhea leading  to hypovolemia.      24 Hour Events:      Day:      40 KCL IV      15 mmol K Phosphate  given      2 Mg sulfate IV given      Repeat PT/INR ordered      Albumin 25 g given before paracentesis      Paracentesis performed      Albumin 50g given after paracentesis      C difficile test      Discontinued NPO, resumed diet      Discontinued 250 cc/hr NS      To give 250 cc NS PRN if hypotensive, may require levophed if MAP <65      Renal Consult placed in view of possible hepato-renal syndrome      GI consult      ID consult placed  Midodrine 5 mg started in view of possible hepato-renal syndrome      Tylenol 325mg  once scheduled given for headache (CTP B adjusted)      C-Diff GDH Ag and Toxin A and B were positive. Zosyn discontinued and PO  Vancomycin 125 mg Q6Hrs started      Night      *20:26 ordered ondansetron 4 mg for nausea      *2:50 ordered ondansetron 4 mg for nausea      Subjective:      The patient was seen in his room today in the ICU. He had one episode of  fever overnight (37.6 C).Currently afebrile. He has had 4 episodes of loose  stools overnight. He was having nausea overnight but not at the moment. He is  also complaining of disturbed sleep. Denies any vomiting, yellowish  discoloration of eyes, hemetemesis, malena, hematochezia.   REVIEW OF SYSTEMS:       As per HPI, otherwise negative.  OBJECTIVE:   VITAL SIGNS:        Vital Signs:        Last Charted:        24 Hr Minimum:        24 Hr Maximum:        Temperature Oral        37.2        (09/29 05:29)        36.6        (09/28 20:00)        37.6 (H)        (09/29 03:00)        Heart Rate        81        (09/29  05:29)        60        (09/28 08:00)        81        (09/28 15:00)        Cardiac Rhythm        Normal sin        (09/28 20:00)          Respiratory Rate        22 (H)        (09/29 05:29)        13 (L)        (09/28 07:00)        31 (H)        (09/29 02:00)        Systolic BP        91        (16/10 05:29)        81 (L)        (09/28 15:00)        120        (09/28 11:00)        Diastolic BP        45 (L)        (09/29 05:29)        44 (L)        (09/28 17:00)        67        (09/28 11:00)        Mean Arterial Pressure        60        (09/29 05:29)        60        (09/29 05:29)  85        (09/28 11:00)        SpO2/Pulse Oximetry        99        (09/29 05:29)        94        (09/28 15:00)        100        (09/28 07:00)   PHYSICAL EXAM:       Vitals: Temperature: 37.2C,HR:81 bpm, RR: 22 bpm, BP: 104/59 mm Hg, Sp02:  99%RA       Lines: Midline in right arm, PIV in left arm       Drips: NA       MV Settings: NA       Intake/Output:       Intake: 1274       Output: 950       24 Hour Balance: +324       Whole Hospitalization Balance: +1674       Physical Exam:       -General appearance: alert and oriented x3, in no apparent acute  distress.       -Neurology: Normal tone, normal speech.       -HEAD: Head is normocephalic, no visible signs of head trauma, pupils  equally round and reactive to light.       -Neck: Supple, no tenderness, no lymphadenopathy, no masses, no  thyromegaly, no bruits. Mild JVD       -Lungs: Clear breath sounds without crackles or wheezes heard.       -Heart: Normal S1/S2 with regular rhythm and rate without any murmurs,  rubs or gallops.       -Abdomen: Distended abdomen with mild tenderness to palpation.       -Skin & Extremities: Full range of motion in all extremities, no lower  limb edema bilaterally, no clubbing or cyanosis.       -Signs of Liver Cell Failure: No scleral icterus, no gynecomastia, no  testicular atrophy, no palmar erythema, no Dupuytren contracture, no  clubbing,  no spider angiomas, no asterixis.       Micro:       Blood Cultures: pending       Ascitic Fluid Culture: pending       Imaging:       9/28: CXR: final report pending       9/24 Abd [place]: Patent main portal vein and left and right portal veins.  Sludge within the gallbladder. Echogenic cirrhotic liver. No significant  splenomegaly. Large amount of ascites in the right lower quadrant and moderate  amount of ascites around the liver       9/22 CXR: Hypoinflation. Trace right pleural effusion   LAB RESULTS:        Hematology Basic - Last 36 hours (27)        Result        Date/Time        WBC        3.0 (L)         09/28 05:58        RBC        3.25 (L)         09/28 05:58        Hgb        11.4 (L)         09/28 05:58        Hct        34.5 (L)  09/28 05:58        MCV        106.2 (H)         09/28 05:58        MCH        35.1 (H)         09/28 05:58        MCHC        33.0         09/28 05:58        RDW        14.1         09/28 05:58        Platelet Count        49 (L)         09/28 05:58        NRBC Auto Abs        0.00         09/28 05:58        NRBC Auto Rel        0.0         09/28 05:58        Neutrophil Rel        57.5         09/28 05:58        Lymphocyte Rel        17.2         09/28 05:58        Monocyte Rel        22.3         09/28 05:58        Eosinophil Rel        2.7         09/28 05:58        Basophil Rel        0.3         09/28 05:58        Neutrophil Abs        1.70 (L)         09/28 05:58        Lymphocyte Abs        0.51 (L)         09/28 05:58        Monocyte Abs        0.66         09/28 05:58        Eosinophil Abs        0.08         09/28 05:58        Basophil Abs        0.01         09/28 05:58        Segs Man        60         09/28 05:58        Band Man        2         09/28 05:58        Lymphocyte Man        26         09/28 05:58        Monocyte Man        12         09/28 05:58        Cells Counted        ++         09/28 05:58  Plt Estimate        4+ Decrease          09/28 05:58          Chemistry Comprehensive - Last 36 hours (19)        Result        Date/time        Sodium Lvl        132 (L)         09/29 05:10        Potassium Lvl        3.4 (L)         09/29 05:10        Chloride Lvl        101         09/29 05:10        CO2        22         09/29 05:10        Glucose Level        99         09/29 05:10        BUN        10         09/29 05:10        Creatinine Lvl        0.71         09/29 05:10        AGAP        9         09/29 05:10        Total Protein        4.9 (L)         09/29 05:10        Albumin Lvl        3.4         09/29 05:10        Calcium Lvl        7.0 (L)         09/29 05:10        Alk Phos        95         09/29 05:10        AST        65 (H)         09/29 05:10        ALT        29         09/29 05:10        Lactic Acid        1.0         09/28 05:58        Magnesium Lvl        1.7         09/29 05:10        Phosphate        1.7 (L)         09/29 05:10        Bili Total        1.6 (H)         09/29 05:10        Bili Direct        TNP         09/28 05:23     RADIOLOGY/DIAGNOSTIC RESULTS:    Radiology - Last 36 hours (1)    XR Chest 1 View (09/27 23:32)    IMPRESSION:  No CHF or  definite pneumonia.    Probable chronic blunting of the right lateral CP angle.    Multiple chronic bilateral rib fractures.      Diagnostics - Non-Radiology (1)    ED EKG-CernerCV (09/22 06:40)    Please review the final report in the patient's chart.    ASSESSMENT/PLAN:       Ascites (R18.8)       Elevated troponin (R77.8)       Esophagitis (K20.90)       Gastritis (K29.70)       Mucosal abnormality of duodenum (K31.9)       Varices of esophagus determined by endoscopy (I85.00)        This is a 66 year old male with a PMHx of untreated hepatitis C,  cirrhosis, and recurrent ascites, recurrent C-Difficile ( x2 in 2022 ) who  presents to the emergency department via EMS after falling out of bed  associated with 3 episode of hematemesis secondary to severe esophagitis  and  ulcer with distal esophageal varices. Overnight with fever and hypotension,  responding to IVF. ICU was consulted for concern of shock. Shock's etiology  seems to be septic but no clear source yet, probably SBP. Patient is in ICU in  view of hypotension which may require pressor support and also a suspected  SBP. SBP was later ruled out on ascitic fluid analysis. C-Difficile GDH Ag and  Toxins A and B turned out to be positive.        Neuro/Psych        - Patient is alert and oriented, no suspicion of encephalopathy.        - No focal neurologic signs.        - Hx of low extremities neuropathy i/s/o spinal stenosis.        - Patient does complain of disturbed sleep. No flapping tremors,  confusion.        Plan:        - Continue monitoring, patient is not in hepatic encephalopathy        Pulmonary        - Patient is not in respiratory distress, no cough.        - Adequate oxygen saturation.        - CXR 9/27 preliminary interpretation does not show consolidations.        PLAN:        NAI        Cardiovascular        #Shock        - Patient was hypotensive overnight on 07/18/2022, 1250 cc NS were  administered; BP improved to 115/65.        - For now not requiring vasopressor but will consider Levophed if  needed.        - On 07/20/2028: The patient is off IV fluids and has been maintaining  his BP in the normal range.        Plan:        - Monitor BP        - Hold nadolol, spironolactone, furosemide for one more day        - Continue Rosuvastatin        - 250 cc NS bolus PRN if the patient is hypotensive        Renal/Fluids/Electrolytes        #Hyponatremia        - Most recent Na 132        - Renal function is adequate  Cr 0.74 BUN 13        On 07/20/2022: Na: 132, K:3.4 (Hemolyzed Sample), Mg: 1.7, P:  1.7,BUN/Cr: 10/0.71        On 07/20/2022: LActic Acid: 2.9        Plan:        - Hold spironolactone and lasix        - 40 meq KCL IV        - 1 g Magnesium Sulfate IV        - KPhosphate IV        -  Renal Consult to rule out hepato-renal syndrome        - Continue Midodrine 5 mg TID for now, Nephrology reccs appreciated        - 25 g albumin IV        - Monitor Urine Output        GI/Nutrition        #Acute upper GI bleeding        - Secondary to severe esophagitis (grade D) and ulceration with small  distal esophageal varices s/p EGD 07/14/22.        - No new episodes of hematemesis since admission. Resolved        #Gastritis & esophagitis        - Esophagitis is most likely cause of hematemesis.        #Cirrhosis/Liver failure        -Child Turcott Pugh Class B ( 8 points )        - Complicated by portal hypertension with recurrent abdominal ascites  and esophageal varices likely 2/2 untreated Hep C and chronic alcohol use  disorder.        - AFP 3.2, negative ANA, reactive Hep B ab, ASCB negative. IgG WNL.        On 07/20/2022: T.Bilirubin: 1.6        #Suspicion of SBP ( now ruled out )        - Currently afebrile, mild pain abdomen present        - Ascitic fluid analysis: Appear : Clear, Color: Yellow, RBC: 3000, TNC:  32, Lymphocytes: 60        - SAAG: 2.7 ( suggestive of portal hypertension as the cause of the  ascitis )        - Ascitic Fluid Culture: pending        #C-difficile Diarrhea        C-Difficile GDH Ag positive and Toxins A and B positive        - Vancomycin 125 mg PO Q6Hrs started        - Zosyn Discontinued        Plan        - Regular diet        - D/C IV PPI and Start Omeprazole PO        - Continue with Carafate to 2g TID x 14 days (9/23- )        - Continue Vancomycin 125 mg PO Q6Hrs        - Continue Folic Acid, Multivitamins and Thiamine 100 mg        - GI consult - reccs appreciated        Hem/Onc        #Pancytopenia        On 07/19/2022:        WBC: 3        Platelets: 49  RBC: 3.25        H/H: 11.4/34.5        On 07/20/2022: WBC: 3.9, H/H: 11.8/35.5, Platelets: 58        Plan:        - Trend CBC        ID        #SBP? ( Now Ruled Out )        - Patient was hypotensive and had  fever.        - Meets shock criteria, shock's etiology seems to be septic but no clear  source yet, probably SBP.        - Blood cultures obtained, pending results        - UA pending        - CXR 9/27 preliminary interpretation does not show consolidations.        # C-difficile diarrhea:        - C. Difficile GDH Ag: positive, C. difficile Toxin A and B: positive        Plan:        - ID consult - Follow Reccs        - PO Vancomycin 125 mg PO Q6Hrs started        Endo:        Blood Glucose: 99        PLAN:        NAI        ICU Checklist        -DVT prophylaxis: not indicated ( H/O GI Bleed )        -GI prophylaxis: Omeprazole 40 mg BID        -Foley: no        -Lines: Midline Left, Right PIV        -Glucose controlled (goal < 180): yes        -Nutrition: GI soft diet        -Restraints renewal: N/A        -Antibiotics de-escalation: yes IV Zosyn to PO Vancomycin        -IV meds transitioned to PO: IV Zosyn to PO Vancomycin        -MV ventilation: N/A        -Laboratory for next AM: yes        -Code status: full code        -HCP:        Denise (Sister) : [phone]        Melissa (Daughter) : [phone]        -HCP Updated: Yes        -HCP Invoked: N/A        -Disposition: ICU        Case discussed with Dr.Meghanne Pletz        Written by Neil Crouch MD PGY 1        ICU Intern        [phone]  ATTESTATION:   I reviewed the medical record and hospital course. I saw and examined the  patient. I reviewed the laboratory results and imaging studies. I confirmed  the subjective and objective elements of the note above. I formulated the  assessment and plan with the ICU team. I coordinated care with ID and GI. Key  features: 50m, alcohol use disorder, untreated HCV, cirrhosis, portal  hypertension, esophageal varices, ascites, hepatic encephalopathy. Admitted  9/22 because of hematemesis. EGD revealed severe esophagitis and gastritis,  small esophageal varices. Continue pantoprazole  and sucralfate. Paracentesis  9/22 13 L  drained. Transferred to ICU because of low BP, fever, tender  abdomen, diarrhea. Paracentesis 9/28 4.2 L - negative for SBP. C difficile  positive, Rx vancomycin PO. Off pip/tazo, resume SBP prophylaxis soon. BP  mostly within appropriate limits, reasonable urine output, BUN/Cr stable, but  lactate increased. Continue midodrine, albumin, sort out hepatorenal syndrome  Rx.     Critical care time 40 minutes  Electronically Signed On 07/20/22 13:46 EDT  _____________________________________________   Hennie Duos MD, ADITYA    Electronically Signed On 07/20/22 17:14 EDT  _____________________________________________   Jonell Cluck MD, Jasimine Simms    Patient account number:  [id number]  Ordering physician:  ,    Other providers:  Brien Mates, CATHERINE HORWITZ, KIMBERLY NKEMAKOLAM, ANNIE CHEMMANUR  No PCP,  ,  ,

## 2022-07-21 NOTE — Progress Notes (Signed)
Progress Note EMR  DATE/TIME NOTE CREATED: 07/21/2022 06:20:20  DATE/TIME PATIENT SEEN:   07/21/2022 07.00  SUBJECTIVE:      Hospital Day: 9      ICU Day: 3      MV Day: NA      POD: NA      Hospital Course:      This is a 66 year old male with a PMHx of untreated hepatitis C,  decompensated liver cirrhosis (recurrent ascites, hepatic encephalopathy and  esophageal varices), recurrent C-Difficile ( x2 in 2022 ) who presents to the  emergency department via EMS after falling out of bed associated with 3  episodes of hematemesis secondary to severe esophagitis and ulcer with distal  esophageal varices.      The patient has a history of similar situations, the last being in August  2023 when 13.3L of clear liquid was removed through paracentesis.      On 9/22 had uncomplicated ultrasound guided paracentesis yielding 13 L of  clear straw-colored ascites.      On 9/23 had endoscopy: EGD with severe grade D esophagitis near to his  proximal esophagus with diffuse ulceration and no evidence of active bleeding.  Appears to have small esophageal varices as well. Diffuse gastritis, and  abnormal mucosa in his duodenum that was biopsied. Suspect bleeding is  secondary to his esophagitis more so than his varices. He was treated with  high dose PPI, octreotide infusion, sucralfate and ceftriaxone for SBP  prophylaxis. However since esophageal variceal bleeding, octreotide has been  discontinued since 9/24. It seems that he's been stable H/H since then and no  reported bleeding events.      On 9/25 had another paracentesis yielding 12 liters of straw-colored  ascitic fluid.      Nadolol started on 9/27      In summary he has required two large volume paracentesis both requiring  albumin administration; last one on 9/25.      Today patient has become hypotensive overnight and had fever in the  afternoon.      Symptom-wise he complains of abdominal pain and persistent loose stools.  He was administered a total 1250 cc NS overnight  which showed improvement in  BP. CXR did not show any consolidation; lactate levels 1.7      Shock's etiology seems to be septic but no clear source yet, probably SBP.  C. difficile is also a possibility. C-Difficile results were positive. It  appears that the low blood pressure was caused by C-Difficile Diarrhea leading  to hypovolemia.      24 Hour events:      Day:      - IV Pantoprazole DCed and PO Omeprazole started      - Continue Vancomycin 125 mg PO Q6 Hr      - Lactate: 2.9      - ALbumin 25 g IV given      - Patient retaining 1L of urine, Straight Cath performed --> only 75 cc  removed      - BMP at 17:15: Na 128, K 3.3, BUN 11, Creatinine 0.74, Lactic acid 1.6.      - Dr Lenis Dickinson recommends FMT (Fecal Microbiota Transplant). Need to touch  base with GI.      K phos 15 mmmol IV x2, neutraphos po x1 for low K and phos, mag 400 mg po  x1      - Renal Consult: Not convinced that the patient has HRS. But advised to  administer Albumin 25g Q8 Hrs for 6 doses.      Night      *20:30 - Ordered UA and Na urine      Na urine < 20      UA non suspicious for infection, protein 30, bili present. UA WBC 20, RBC  5, no bacteria.      Subjective:      The patient was seen in his room today morning. The patient is feeling  better overall. Still complaining of mild pain abdomen. He feels he may need  another paracentesis in view of abdominal distension. He had 2 episodes of  loose stools overnight. No fever, chest pain, SOB, palpitations, nausea,  vomiting, no burning micturition, no leg swelling or pain.   REVIEW OF SYSTEMS:       As per HPI, otherwise negative  OBJECTIVE:   VITAL SIGNS:        Vital Signs:        Last Charted:        24 Hr Minimum:        24 Hr Maximum:        Temperature Oral        36.8        (09/30 00:00)        36.8        (09/30 00:00)        37.2        (09/29 14:00)        Heart Rate        67        (09/30 04:00)        66        (09/29 23:00)        87        (09/29 10:00)        Respiratory  Rate        14        (09/30 04:00)        14        (09/29 20:30)        23 (H)        (09/29 18:00)        Systolic BP        102        (09/30 04:00)        86 (L)        (09/29 14:00)        117        (09/29 10:00)        Diastolic BP        52 (L)        (09/30 04:00)        44 (L)        (09/29 20:30)        67        (09/29 18:00)        Mean Arterial Pressure        69        (09/30 04:00)        60        (09/29 20:30)        82        (09/29 18:00)        SpO2/Pulse Oximetry        97        (09/30 04:00)        94        (09/29 20:30)  100        (09/29 14:15)   PHYSICAL EXAM:       Vitals: Temperature: 36.8C,HR:67 bpm, RR: 14 bpm, BP:  102/52 mm Hg,  Sp02: 97% RA       Lines: Midline in right arm, PIV in left arm       Drips: NA       MV Settings: NA       Intake/Output:       Intake: 1980       Output: 350       24 Hour Balance: +1630       Whole Hospitalization Balance: +3754       Physical Exam:       -General appearance: alert and oriented x3, in no apparent acute  distress.       -Neurology: Normal tone, normal speech.       -HEAD: Head is normocephalic, no visible signs of head trauma, pupils  equally round and reactive to light.       -Neck: Supple, no tenderness, no lymphadenopathy, no masses, no  thyromegaly, no bruits. Mild JVD       -Lungs: Clear breath sounds without crackles or wheezes heard.       -Heart: Normal S1/S2 with regular rhythm and rate without any murmurs,  rubs or gallops.       -Abdomen: Distended abdomen with mild tenderness to palpation.       -Skin & Extremities: Full range of motion in all extremities, no lower  limb edema bilaterally, no clubbing or cyanosis.       -Signs of Liver Cell Failure: No scleral icterus, no gynecomastia, no  testicular atrophy, no palmar erythema, no Dupuytren contracture, no clubbing,  no spider angiomas, no asterixis.       Micro:       Blood Cultures: pending       Ascitic Fluid Culture: pending       Imaging:       9/27: CXR: No CHF or  definite pneumonia       Probable chronic blunting of the right lateral CP angle.       Multiple chronic bilateral rib fractures.       9/24 Abd [place]: Patent main portal vein and left and right portal veins.  Sludge within the gallbladder. Echogenic cirrhotic liver. No significant  splenomegaly. Large amount of ascites in the right lower quadrant and moderate  amount of ascites around the liver       9/22 CXR: Hypoinflation. Trace right pleural effusion   LAB RESULTS:        Hematology Basic - Last 36 hours (21)        Result        Date/Time        WBC        3.9 (L)         09/29 09:35        RBC        3.33 (L)         09/29 09:35        Hgb        11.8 (L)         09/29 09:35        Hct        35.5 (L)         09/29 09:35        MCV        106.6 (H)  09/29 09:35        MCH        35.4 (H)         09/29 09:35        MCHC        33.2         09/29 09:35        RDW        14.4         09/29 09:35        Platelet Count        58 (L)         09/29 09:35        NRBC Auto Abs        0.00         09/29 09:35        NRBC Auto Rel        0.0         09/29 09:35        Neutrophil Rel        67.5         09/29 09:35        Lymphocyte Rel        11.1         09/29 09:35        Monocyte Rel        19.6         09/29 09:35        Eosinophil Rel        1.0         09/29 09:35        Basophil Rel        0.5         09/29 09:35        Neutrophil Abs        2.61         09/29 09:35        Lymphocyte Abs        0.43 (L)         09/29 09:35        Monocyte Abs        0.76         09/29 09:35        Eosinophil Abs        0.04         09/29 09:35        Basophil Abs        0.02         09/29 09:35          Chemistry Comprehensive - Last 36 hours (18)        Result        Date/time        Sodium Lvl        128 (L)         09/29 17:15        Potassium Lvl        3.3 (L)         09/29 17:15        Chloride Lvl        99         09/29 17:15        CO2        22         09/29 17:15        Glucose Level        121 (H)  09/29  17:15        BUN        11         09/29 17:15        Creatinine Lvl        0.74         09/29 17:15        AGAP        7 (L)         09/29 17:15        Total Protein        4.9 (L)         09/29 05:10        Albumin Lvl        3.4         09/29 05:10        Calcium Lvl        7.3 (L)         09/29 17:15        Alk Phos        95         09/29 05:10        AST        65 (H)         09/29 05:10        ALT        29         09/29 05:10        Lactic Acid        1.6         09/29 17:15        Magnesium Lvl        1.7         09/29 17:15        Phosphate        1.4 (L)         09/29 17:15        Bili Total        1.6 (H)         09/29 05:10     RADIOLOGY/DIAGNOSTIC RESULTS:    Radiology - Last 36 hours (0)    No results in past 36 hours      Diagnostics - Non-Radiology (1)    ED EKG-CernerCV (09/22 06:40)    Please review the final report in the patient's chart.    ASSESSMENT/PLAN:       Ascites (R18.8)       Elevated troponin (R77.8)       Esophagitis (K20.90)       Gastritis (K29.70)       Mucosal abnormality of duodenum (K31.9)       Varices of esophagus determined by endoscopy (I85.00)        This is a 66 year old male with a PMHx of untreated hepatitis C,  cirrhosis, and recurrent ascites, recurrent C-Difficile ( x2 in 2022 ) who  presents to the emergency department via EMS after falling out of bed  associated with 3 episode of hematemesis secondary to severe esophagitis and  ulcer with distal esophageal varices. Overnight with fever and hypotension,  responding to IVF. ICU was consulted for concern of shock. Shock's etiology  seems to be septic but no clear source yet, probably SBP. Patient is in ICU in  view of hypotension which may require pressor support and also a suspected  SBP. SBP was later ruled out on ascitic fluid analysis. C-Difficile GDH Ag and  Toxins A and B turned  out to be positive.        Neuro/Psych        - Patient is alert and oriented, no suspicion of encephalopathy.        - No focal  neurologic signs.        - Hx of low extremities neuropathy i/s/o spinal stenosis.        - Patient does complain of disturbed sleep. No flapping tremors,  confusion.        Plan:        - Continue monitoring, patient is not in hepatic encephalopathy        Pulmonary        - Patient is not in respiratory distress, no cough.        - Adequate oxygen saturation.        - CXR 9/27 preliminary interpretation does not show consolidations.        PLAN:        NAI        Cardiovascular        #Shock        - Patient was hypotensive overnight on 07/18/2022, 1250 cc NS were  administered; BP improved to 115/65.        - For now not requiring vasopressor but will consider Levophed if  needed.        - On 07/20/2028: The patient is off IV fluids and has been maintaining  his BP in the normal range.        Plan:        - Monitor BP        - Hold nadolol, spironolactone, furosemide for now        - Continue Rosuvastatin        - 250 cc NS bolus PRN if the patient is hypotensive        Renal/Fluids/Electrolytes        #Hyponatremia        - Most recent Na 132        - Renal function is adequate Cr 0.74 BUN 13        On 07/20/2022: Na: 132, K:3.4 (Hemolyzed Sample), Mg: 1.7, P:  1.7,BUN/Cr: 10/0.71        On 07/20/2022: Lactic Acid: 2.9        On 07/21/2022: Na/K: 131/3.3 , Mg: 1.8, P: 2.6        Plan:        - 15 mmol of K Phos x 2        - 1 gram of magnesium sulfate        - Hold spironolactone and lasix        - Renal Consult to rule out hepato-renal syndrome - not convinced that  it is hepato-renal syndrome. But advised to continue Midodrine 5 mg TID and  add Albumin 25g Q8Hrly for 6 doses.        - Continue Midodrine 5 mg TID        - Monitor Urine Output        GI/Nutrition        #Acute upper GI bleeding        - Secondary to severe esophagitis (grade D) and ulceration with small  distal esophageal varices s/p EGD 07/14/22.        - No new episodes of hematemesis since admission. Resolved        #Gastritis & esophagitis         - Esophagitis  is most likely cause of hematemesis.        #Cirrhosis/Liver failure        -Child Turcott Pugh Class B ( 8 points )        - Complicated by portal hypertension with recurrent abdominal ascites  and esophageal varices likely 2/2 untreated Hep C and chronic alcohol use  disorder.        - AFP 3.2, negative ANA, reactive Hep B ab, ASCB negative. IgG WNL.        On 07/20/2022: T.Bilirubin: 1.6        #Suspicion of SBP ( now ruled out )        - Currently afebrile, mild pain abdomen present        - Ascitic fluid analysis: Appear : Clear, Color: Yellow, RBC: 3000, TNC:  32, Lymphocytes: 60        - SAAG: 2.7 ( suggestive of portal hypertension as the cause of the  ascitis )        - Ascitic Fluid Culture: pending        #C-difficile Diarrhea        C-Difficile GDH Ag positive and Toxins A and B positive        - Vancomycin 125 mg PO Q6Hrs - to continue to complete 10 days of  treatment.        - Zosyn Discontinued        Plan        - Regular diet        - Continue Omeprazole PO        - Continue with Carafate to 2g TID x 14 days (9/23- )        - Continue Vancomycin 125 mg PO Q6Hrs        - Continue Folic Acid, Multivitamins and Thiamine 100 mg        - GI consult - reccs appreciated        Hem/Onc        #Pancytopenia        On 07/19/2022:        WBC: 3        Platelets: 49        RBC: 3.25        H/H: 11.4/34.5        On 07/20/2022: WBC: 3.9, H/H: 11.8/35.5, Platelets: 58        On 07/21/2022: WBC: 2.8, H/H: 9.6/28.6, Platelets: 49        Plan:        - Trend CBC        ID        #SBP? ( Now Ruled Out )        - Patient was hypotensive and had fever.        - Meets shock criteria, shock's etiology seems to be septic but no clear  source yet, probably SBP.        - Blood cultures obtained, pending results        - UA pending        - CXR 9/27 preliminary interpretation does not show consolidations.        # C-difficile diarrhea:        - C. Difficile GDH Ag: positive, C. difficile Toxin A and B:  positive        Plan:        - ID consult - Follow Reccs        - ID recommended  Fecal Microbiota Transplant in view of the patient's  recurrent C-Difficile Diarrhea.        - To ask ID regarding need for SBP prophylaxis in this patient and also  why they recommended FMT over fidaxomyxcin for this patient's recurrent C.  difficile.        - PO Vancomycin 125 mg PO Q6Hrs - to continue to complete 10 day course.        Endo:        Blood Glucose: 103        PLAN:        NAI        ICU Checklist        -DVT prophylaxis: not indicated ( H/O GI Bleed )        -GI prophylaxis: Omeprazole 40 mg BID        -Foley: no        -Lines: Midline Left, Right PIV        -Glucose controlled (goal < 180): yes        -Nutrition: GI soft diet        -Restraints renewal: N/A        -Antibiotics de-escalation: yes IV Zosyn to PO Vancomycin        -IV meds transitioned to PO: IV Zosyn to PO Vancomycin        -MV ventilation: N/A        -Laboratory for next AM: yes        -Code status: full code        -HCP:        Denise (Sister) : [phone]        Melissa (Daughter) : [phone]        -HCP Updated: Yes        -HCP Invoked: N/A        -Disposition: ICU        Case discussed with Dr.Kyley Laurel        Written by Neil Crouch MD PGY 1        ICU Intern        [phone]  ATTESTATION:   I reviewed the medical record and hospital course. I saw and examined the  patient. I reviewed the laboratory results and imaging studies. I confirmed  the subjective and objective elements of the note above. I formulated the  assessment and plan with the ICU team. I coordinated care with ID and GI. Key  features: 26m, alcohol use disorder, untreated HCV, cirrhosis, portal  hypertension, esophageal varices, ascites, hepatic encephalopathy. Admitted  9/22 because of hematemesis. EGD revealed severe esophagitis and gastritis,  small esophageal varices. Continue pantoprazole and sucralfate. Paracentesis  9/22 13 L drained. Transferred to ICU because of low BP,  fever, tender  abdomen, diarrhea. Paracentesis 9/28 4.2 L - negative for SBP. C difficile  positive, continue vancomycin PO. Sort out timing for resuming SBP  prophylaxis. BP and lactic acid improving with midodrine and albumin. BUN/Cr  stable, no definitive hepatorenal syndrome, no indication for octreotide. Hold  off nadolol, furosemide and spironolactone. May need another paracentesis  soon.     Critical care time 35 minutes  Electronically Signed On 07/21/22 10:44 EDT  _____________________________________________   Hennie Duos MD, ADITYA    Electronically Signed On 07/21/22 16:13 EDT  _____________________________________________   Jonell Cluck MD, Cobie Leidner    Patient account number:  [id number]  Ordering physician:  ,    Other providers:  Brien Mates, Ladoris Gene, KIMBERLY NKEMAKOLAM,  ANNIE CHEMMANUR  No PCP,  ,  ,

## 2022-07-22 NOTE — Procedures (Addendum)
Procedure Note EMR  DATE/TIME NOTE CREATED: 07/22/2022 05:42:55  DATE/TIME PATIENT SEEN:   07/22/2022 0630  SUBJECTIVE:      Date of Admission: 07/13/2022      Transferred to ICU: 07/19/2022      Hospital Day: 10      ICU Day: 4      MV Day: NA      POD: NA      Hospital Course:      This is a 66 year old male with a PMHx of untreated hepatitis C,  decompensated liver cirrhosis (recurrent ascites, hepatic encephalopathy and  esophageal varices), recurrent C-Difficile ( x2 in 2022 ) who presents to the  emergency department via EMS after falling out of bed associated with 3  episodes of hematemesis secondary to severe esophagitis and ulcer with distal  esophageal varices. The patient has a history of similar presentations, the  last being in August 2023 when 13.3L of clear liquid was removed via  paracentesis. During this hospital stay patient initially had two  uncomplicated ultrasound guided paracenteses three days apart yielding a total  of 25 liters of clear, straw-colored ascitic fluid. Both large volume  paracenteses required albumin administration.      On 9/23 had endoscopy: EGD with severe grade D esophagitis near to his  proximal esophagus with diffuse ulceration and no evidence of active bleeding.  Appears to have small esophageal varices as well. Diffuse gastritis, and  abnormal mucosa in his duodenum that was biopsied. Suspect bleeding is  secondary to his esophagitis more so than his varices. He was started on a  high dose PPI, octreotide infusion, sucralfate and ceftriaxone for SBP  prophylaxis. However since unlikely patient had esophageal variceal bleeding,  octreotide was discontinued since 9/24. It seems that he's been stable H/H  since then and no reported bleeding events. Nadolol started on 9/27. From a  renal standpoint, the nephrology team does not believe the patient has  hepatorenal syndrome. Patient given albumin 25g Q8H for 6 doses.      Patient was transferred to ICU for hypotension and  potential need for  pressor support. He was administered IVF which showed improvement in BP. He  reported abdominal pain and persistent loose stools in the setting of pyrexia.  Infectious and shock workup were performed. UA and CXR negative for UTI and  PNA respectively. Patient was diagnosed with hypovolemia due to persistent  diarrhea iso C.difficile. He was started on vancomycin 125 mg PO Q6 Hr and SBP  prophylaxis was switched to bactrim. He had another therapeutic paracentesis  on 9/28 with albumin administration, fluid sent was consistent with transudate  making SBP unlikely. GI consulted for fecal microbiota transplant per ID  recommendations, however they are unable to perform FBT. Patient will continue  on a slow vancomycin taper. He may need regular paracentesis. He should follow  up with Dr. Marnette Burgess in clinic.      24 Hour events:      Day:      -Continue Vancomycin and to taper it according to ID recommendations.      -Started Bactrim DS BID for SBP prophylaxis        Night      - patient passing 75ml urine overnight, low urinary output      Subjective:      Patient seen at bedside today. Reports nausea and 1 episode vomiting clear  liquid last night, no hematemesis. Patient had diarrhea last night, unable to  recall how many times.  Patient also reported intermittent mild burning chest  pain and difficulty breathing for seconds-minutes, which he attributed to  GERD. He is experiencing worsening abdominal discomfort. Denies new chest  pain, shortness of breath, new numbness or tingling in extremities.   REVIEW OF SYSTEMS:       as above  OBJECTIVE:   VITAL SIGNS:        Vital Signs:        Last Charted:        24 Hr Minimum:        24 Hr Maximum:        Temperature Oral        36.8        (09/30 13:30)        36.8        (09/30 13:30)        36.8        (09/30 08:00)        Heart Rate        73        (09/30 21:00)        66        (09/30 07:00)        77        (09/30 18:07)        Cardiac Rhythm         Normal sin        (09/30 21:00)          Pulse/HR Location        Apical        (09/30 18:07)          Pulse/HR Method        Monitor        (09/30 21:00)          Respiratory Rate        16        (09/30 21:00)        14        (09/30 20:00)        19        (09/30 18:07)        Systolic BP        95        (57/84 21:00)        90        (09/30 06:15)        127        (09/30 13:30)        Diastolic BP        53 (L)        (09/30 21:00)        46 (L)        (09/30 06:15)        76        (09/30 13:30)        Mean Arterial Pressure        67        (09/30 21:00)        61        (09/30 06:15)        93        (09/30 13:30)        Blood Pressure Location        Left        (09/30 21:00)          Blood Pressure Method        Automatic        (  09/30 21:00)          Probe Site        Left        (09/30 21:00)          SpO2/Pulse Oximetry        98        (09/30 21:00)        98        (09/30 06:15)        100        (09/30 13:30)   PHYSICAL EXAM:       Vitals: stable, afebrile, RA       Lines: Midline in right arm, PIV in left arm       Drips: NA       MV Settings: NA       Intake/Output:       Intake: 1996.99       Output: 350       24 Hour Balance: +1646.99       Whole Hospitalization Balance: +6011       Physical Exam:       General appearance: alert and oriented x3, in no apparent acute distress.       HEAD: Head is normocephalic, no visible signs of head trauma, pupils  equally round and reactive to light. Anicteric       Neck: Supple, no tenderness, no lymphadenopathy, no masses, no  thyromegaly, no bruits. Mild JVD       Lungs: Clear breath sounds without crackles or wheezes heard.       Heart: Normal S1/S2 with regular rhythm and rate without any murmurs,  rubs or gallops.       Abdomen: Caput medusae around umbilicus. Distended abdomen with mild  tenderness to palpation. Fluid wave, shifting dullness appreciated.       Skin & Extremities: Full range of motion in all extremities, no lower  limb edema  bilaterally, no clubbing or cyanosis. No jaundice       Neurology: Normal tone, normal speech. No focal neurological deficits       Labs:       - pancytopenia (improving)       - Hyponatremia stable at baseline, BUN/CR within normal limits, K Mg Phos  below goal       Micro:       Blood Cultures: neg (prelim)       Ascitic Fluid Culture: neg (prelim)       Imaging:       9/27: CXR: No CHF or definite pneumonia       Probable chronic blunting of the right lateral CP angle.       Multiple chronic bilateral rib fractures.       9/24 Abd [place]: Patent main portal vein and left and right portal veins.  Sludge within the gallbladder. Echogenic cirrhotic liver. No significant  splenomegaly. Large amount of ascites in the right lower quadrant and moderate  amount of ascites around the liver       9/22 CXR: Hypoinflation. Trace right pleural effusion   LAB RESULTS:        Hematology Basic - Last 36 hours (21)        Result        Date/Time        WBC        2.6 (L)         10/01 05:06        RBC  2.92 (L)         10/01 05:06        Hgb        10.2 (L)         10/01 05:06        Hct        29.9 (L)         10/01 05:06        MCV        102.4 (H)         10/01 05:06        MCH        34.9 (H)         10/01 05:06        MCHC        34.1         10/01 05:06        RDW        14.4         10/01 05:06        Platelet Count        68 (L)         10/01 05:06        NRBC Auto Abs        0.00         09/29 09:35        NRBC Auto Rel        0.0         09/29 09:35        Neutrophil Rel        67.5         09/29 09:35        Lymphocyte Rel        11.1         09/29 09:35        Monocyte Rel        19.6         09/29 09:35        Eosinophil Rel        1.0         09/29 09:35        Basophil Rel        0.5         09/29 09:35        Neutrophil Abs        2.61         09/29 09:35        Lymphocyte Abs        0.43 (L)         09/29 09:35        Monocyte Abs        0.76         09/29 09:35        Eosinophil Abs        0.04         09/29  09:35        Basophil Abs        0.02         09/29 09:35          Chemistry Comprehensive - Last 36 hours (18)        Result        Date/time        Sodium Lvl        131 (L)         09/30 06:35        Potassium Lvl  3.3 (L)         09/30 06:35        Chloride Lvl        101         09/30 06:35        CO2        22         09/30 06:35        Glucose Level        103         09/30 06:35        BUN        8         09/30 06:35        Creatinine Lvl        0.67         09/30 06:35        AGAP        8 (L)         09/30 06:35        Total Protein        4.8 (L)         09/30 06:35        Albumin Lvl        3.5         09/30 06:35        Calcium Lvl        7.2 (L)         09/30 06:35        Alk Phos        76         09/30 06:35        AST        45 (H)         09/30 06:35        ALT        21         09/30 06:35        Lactic Acid        1.6         09/29 17:15        Magnesium Lvl        1.8         09/30 06:35        Phosphate        2.6 (L)         09/30 06:35        Bili Total        1.0         09/30 06:35     RADIOLOGY/DIAGNOSTIC RESULTS:    Radiology - Last 36 hours (0)    No results in past 36 hours      Diagnostics - Non-Radiology (1)    ED EKG-CernerCV (09/22 06:40)    Please review the final report in the patient's chart.    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 Patient is s/p multiple large volume  paracenteses with albumin administration. Patient transferred to ICU for  concern of  hypovolemic shock iso diarrhea d/t C.difficile that may require  pressor support. SBP and hepatorenal syndrome ruled out.      Neuro/Psych      - Patient is alert and oriented, no suspicion of encephalopathy. Patient  complains of disturbed sleep. No flapping tremors, confusion. No focal  neurologic signs.      - Hx of lower extremities neuropathy i/s/o spinal stenosis.      Plan:      - Melatonin PRN for sleep      Pulmonary      - Patient is not in respiratory distress, no cough. Adequate oxygen  saturation on room air.      - CXR 9/27 negative for PNA, pt does not have respiratory source of  infection      - NAI      Cardiovascular      #Shock, hypovolemic (resolved)      - Patient was hypotensive overnight on 07/18/2022, 1250 cc NS were  administered; BP improved to 115/65. For now not requiring vasopressor support  but will consider Levophed if needed.      - On 07/20/2028: The patient is off IV fluids and has been maintaining his  BP in the normal range (110/60s)      Plan:      - Monitor BP      - Hold nadolol, spironolactone, furosemide for now      - Continue Rosuvastatin      - 250 cc NS bolus PRN if the patient is hypotensive      Renal/Fluids/Electrolytes      #Hyponatremia      - Most recent Na 131, Renal function BUN/Cr is within normal limits      - pt retaining urine approx 1L, straight cath produced 75ml, UA negative      - Renal Consult to rule out hepato-renal syndrome, the team is not  convinced that this is hepato-renal syndrome. We will reduce Midodrine 5 mg  TID to BID. Patient completed course of Albumin 25g Q8Hrly for 6 doses.      Plan:      - replete electrolytes as indicated with goals K>4, Mg>2, phos >3      - Hold spironolactone and lasix      - decrease Midodrine to 5 mg BID      #low urine output      - 75ml overnight      - Monitor UO      GI/Nutrition      #C-difficile Diarrhea      - C-Difficile GDH Ag positive and Toxins A and B positive      - On Vancomycin 125 mg PO Q6Hrs - to  continue to complete 10 days of  treatment (9/28- expected end 10/8)      Plan      - c/w vancomycin day 3 of 10      #Cirrhosis/Liver failure      #recurrent ascites      -Child Turcott Pugh Class B ( 8 points )      - Complicated by portal hypertension with recurrent abdominal ascites and  esophageal varices likely 2/2 untreated Hep C and chronic alcohol use  disorder.      - AFP 3.2, negative ANA, reactive  Hep B ab, ASCB negative. IgG WNL.      On 07/22/2022: Bilirubin: 1.1      - Initially started on ceftriaxone for ppx, switched to zosyn to cover  SBP, and then zosyn stopped when SBP ruled out. Per ID recs, started on  bactrim for recurrent SBP ppx.      - Ascitic fluid analysis: Appear : Clear, Color: Yellow, RBC: 3000, TNC:  32, Lymphocytes: 60. SAAG: 2.7 ( suggestive of portal hypertension as the  cause of the ascites).      - Ascitic Fluid Culture: pending      - 9/28 had large >10cm diameter hydrocele likely iso ascites      Plan      - therapeutic paracentesis today      - c/w bactrim 1 tab BID (9/30- ) for SBP ppx      - Continue Folic Acid, Multivitamins and Thiamine 100 mg      - Tums PRN      #Gastritis & esophagitis      - Esophagitis is most likely cause of hematemesis.      Plan      - c/w high dose Omeprazole PO      - Continue with Carafate to 2g TID x 14 days (9/23- ). Today is day 8      - Benzocaine spray PRN      #Acute upper GI bleeding (resolved)      - Secondary to severe esophagitis (grade D) and ulceration with small  distal esophageal varices s/p EGD 07/14/22.      - No new episodes of hematemesis since admission.      Hem/Onc      #Pancytopenia      - stable      Plan:      - monitor CBC      ID      #shock ?septic v hypovolemic (resolved)      #C.difficile infection      - Patient was hypotensive and had fever. Met shock criteria, determined to  be hypovolemic. ID workup found C.diff infection, no UTI or URTI      - Fecal Microbiota Transplant recommended by ID in view of the  patient's  recurrent C-Difficile Diarrhea. The treatment is not performed by GI team  here.      Plan:      - c/w PO Vancomycin 125 mg PO Q6H slow taper (9/28- )      *125 mg orally 4 times daily for 10  days, then      *125 mg orally 2 times daily for 7 days, then      *125 mg orally once daily for 7 days, then      *125 mg orally every 2  days for 2 weeks      Endo:      Blood Glucose: 103      NAI      ICU Checklist      -DVT prophylaxis: mechanical ppx ( H/O GI Bleed )      -GI prophylaxis: PPI      -Foley: no      -Lines: Midline Left, Right PIV      -Glucose controlled (goal < 180): yes      -Nutrition: GI soft diet      -Restraints renewal: N/A      -Antibiotics de-escalation: yes , done      -IV meds transitioned to PO: yes, done      -  MV ventilation: N/A      -Laboratory for next AM: yes      -Code status: full code      -HCP:      Denise (Sister) : [phone]      Melissa (Daughter) : [phone]      -HCP Updated: Yes      -HCP Invoked: N/A      -Disposition: triageable to med/surg      Plan and management discussed with the attending, Dr.Rebbecca Osuna.      Adele Dan, MD PGY1  ATTESTATION:   I reviewed the medical record and hospital course. I saw and examined the  patient. I reviewed the laboratory results and imaging studies. I confirmed  the subjective and objective elements of the note above. I formulated the  assessment and plan with the ICU team. I coordinated care with ID and GI. Key  features: 17m, alcohol use disorder, untreated HCV, cirrhosis, portal  hypertension, esophageal varices, ascites, hepatic encephalopathy. Admitted  9/22 because of hematemesis. EGD revealed severe esophagitis and gastritis,  small esophageal varices. Continue pantoprazole and sucralfate. Paracentesis  9/22 13 L drained. Transferred to ICU because of low BP, fever, tender  abdomen, diarrhea. Paracentesis 9/28 4.2 L - negative for SBP. C difficile  positive, continue vancomycin PO. Resume SBP prophylaxis - use TMP/SMX. BP  and  lactic acid improved, completed course of albumin, continue midodrine and  albumin. BUN/Cr stable, no definite hepatorenal syndrome. Continue holding  nadolol, furosemide, spironolactone. Will need another paracentesis - 4 to 5 L  seems preferable in the short term to mitigate risk of hypotension.     Critical care time 35 minutes  Electronically Signed On 07/22/22 09:35 EDT  _____________________________________________   NGO MD, LAN    Electronically Signed On 07/22/22 12:54 EDT  _____________________________________________   Jonell Cluck MD, Shooter Tangen    Patient account number:  [id number]  Ordering physician:  ,    Other providers:  Brien Mates, CATHERINE HORWITZ, KIMBERLY NKEMAKOLAM, JOSHUA HUNDERT  No PCP,  ,  ,

## 2022-07-23 NOTE — Progress Notes (Addendum)
Progress Note EMR  DATE/TIME NOTE CREATED: 07/23/2022 06:36:04  DATE/TIME PATIENT SEEN:   07/23/2022 08:48:45  SUBJECTIVE:      Date of Admission: 07/13/2022      Date of Transfer to ICU: 07/19/2022      Hospital Day: 10      ICU Day: 4      MV Day: N/A      POD: N/A      Antibiotic(s)      Anti-Infective Medication(s) (2) Active      sulfamethoxazole-trimethoprim (Bactrim DS 800 mg-160mg )  1 tab, Oral, BID      vancomycin  125 mg = 1 cap, Oral, QID      Consultants Involved      HUNDERT MD, Angie Fava MD, Mayford Knife MD, Denali Sharma; Lenis Dickinson MD,  [facility]      This is a 66 year old male with a PMHx of untreated hepatitis C,  decompensated liver cirrhosis (recurrent ascites, hepatic encephalopathy and  esophageal varices), recurrent C-Difficile ( x2 in 2022 ) who presents to the  emergency department via EMS after falling out of bed associated with 3  episodes of hematemesis secondary to severe esophagitis and ulcer with distal  esophageal varices. The patient has a history of similar presentations, the  last being in August 2023 when 13.3L of clear liquid was removed via  paracentesis. During this hospital stay patient initially had two  uncomplicated ultrasound guided paracenteses three days apart yielding a total  of 25 liters of clear, straw-colored ascitic fluid. Both large volume  paracenteses required albumin administration.      On 9/23 had endoscopy: EGD with severe grade D esophagitis near to his  proximal esophagus with diffuse ulceration and no evidence of active bleeding.  Appears to have small esophageal varices as well. Diffuse gastritis, and  abnormal mucosa in his duodenum that was biopsied. Suspect bleeding is  secondary to his esophagitis more so than his varices. He was started on a  high dose PPI, octreotide infusion, sucralfate and ceftriaxone for SBP  prophylaxis. However since unlikely patient had esophageal variceal bleeding,  octreotide was discontinued since 9/24. It  seems that he's been stable H/H  since then and no reported bleeding events. Nadolol started on 9/27. From a  renal standpoint, the nephrology team does not believe the patient has  hepatorenal syndrome. Patient given albumin 25g Q8H for 6 doses.      Patient was transferred to ICU for hypotension and potential need for  pressor support. He was administered IVF which showed improvement in BP. He  reported abdominal pain and persistent loose stools in the setting of pyrexia.  Infectious and shock workup were performed. UA and CXR negative for UTI and  PNA respectively. Patient was diagnosed with hypovolemia due to persistent  diarrhea iso C.difficile. He was started on vancomycin 125 mg PO Q6 Hr and SBP  prophylaxis was switched to bactrim. He had another therapeutic paracentesis  on 9/28 with albumin administration, fluid sent was consistent with transudate  making SBP unlikely. GI consulted for fecal microbiota transplant per ID  recommendations, however they are unable to perform FBT. Patient will continue  on a slow vancomycin taper. He may need regular paracentesis. He should follow  up with Dr. Marnette Burgess in clinic.      24 Hour Events      Day:      - Continue Vancomycin and to taper according to ID recommendations      -  Therapeutic paracentesis w/9.7L removed      Overnight Event(s)      NAE      Subjective      Patient seen at bedside today. Reports full, turgid, mildly tender abdomen  in advance of paracentesis. Some tingling in the lower extremities.     REVIEW OF SYSTEMS:       A multi-point review of systems was performed, and was negative except as  documented above.  OBJECTIVE:   VITAL SIGNS:        Vital Signs:        Last Charted:        24 Hr Minimum:        24 Hr Maximum:        Temperature Oral        36.2        (10/02 08:00)        36.2        (10/02 08:00)        36.2        (10/01 16:00)        Heart Rate        74        (10/02 12:00)        61        (10/02 06:00)        74        (10/01 18:00)         Cardiac Rhythm        Normal sin        (10/01 22:00)          Pulse/HR Method        Monitor        (10/01 22:00)          Respiratory Rate        17        (10/02 12:00)        12 (L)        (10/02 03:00)        18        (10/01 14:18)        Systolic BP        118        (10/02 11:30)        77 (L)        (10/01 14:00)        133        (10/01 22:00)        Diastolic BP        62        (16/10 11:30)        48 (L)        (10/02 11:00)        80        (10/02 08:00)        Mean Arterial Pressure        81        (10/02 11:30)        64        (10/01 14:00)        96        (10/02 08:00)        SpO2/Pulse Oximetry        98        (10/02 06:00)        98        (10/01 22:00)        99        (  10/01 20:00)   PHYSICAL EXAM:       Vital Signs: stable, afebrile, not tachycardic, not tachypneic,  normotensive       General: well nourished, well developed, awake and alert, resting  comfortably in no acute distress       Head: grossly normocephalic, atraumatic       Eyes: normal inspection, extraocular muscles intact, no conjunctival  pallor, no jaundice       Ear, Nose, Throat: normal external appearance       Neck: normal range of motion, no step-offs       Respiratory: no respiratory distress, lungs CTA bilaterally       Cardiovascular: not tachycardic, RRR without murmur appreciated, normal  S1/S2       GI: Abdomen highly distended/turgid, mildly tender diffusely with no  guarding or rebound; +BS diminished 4, fluid wave present       Back: Normal inspection of the back with good strength and range of  motion throughout all extremities       Extremities: pulses intact with good cap refills, no LE pitting edema or  calf tenderness       Neurological: alert and oriented to person, place, and time,  appropriately conversive, with 5/5 bilateral UE/LE strength, no gross motor or  sensory defects noted. Coordination appears adequate       Skin: Warm, dry, and intact; no noted rash; discolored nails bilaterally  across  fingers and toes     LAB RESULTS:        Hematology Basic - Last 36 hours (9)        Result        Date/Time        WBC        2.5 (L)         10/02 05:22        RBC        3.05 (L)         10/02 05:22        Hgb        10.7 (L)         10/02 05:22        Hct        31.5 (L)         10/02 05:22        MCV        103.3 (H)         10/02 05:22        MCH        35.1 (H)         10/02 05:22        MCHC        34.0         10/02 05:22        RDW        14.5         10/02 05:22        Platelet Count        77 (L)         10/02 05:22          Chemistry Comprehensive - Last 36 hours (17)        Result        Date/time        Sodium Lvl        133 (L)         10/02 05:22        Potassium Lvl  4.3         10/02 05:22        Chloride Lvl        102         10/02 05:22        CO2        21 (L)         10/02 05:22        Glucose Level        93         10/02 05:22        BUN        10         10/02 05:22        Creatinine Lvl        0.69         10/02 05:22        AGAP        10         10/02 05:22        Total Protein        5.2 (L)         10/02 05:22        Albumin Lvl        3.6         10/02 05:22        Calcium Lvl        7.5 (L)         10/02 05:22        Alk Phos        94         10/02 05:22        AST        63 (H)         10/02 05:22        ALT        32         10/02 05:22        Magnesium Lvl        1.9         10/02 05:22        Phosphate        2.3 (L)         10/01 05:06        Bili Total        1.1 (H)         10/02 05:22     RADIOLOGY/DIAGNOSTIC RESULTS:    Radiology - Last 36 hours (0)    No results in past 36 hours      Diagnostics - Non-Radiology (1)    ED EKG-CernerCV (09/22 06:40)    Please review the final report in the patient's chart.    DISPOSITION PLANNING:   Pending PT/OT evaluation for disposition  ASSESSMENT/PLAN:       Ascites (R18.8)       Elevated troponin (R77.8)       Esophagitis (K20.90)       Gastritis (K29.70)       Mucosal abnormality of duodenum (K31.9)       Varices of esophagus  determined by endoscopy (I85.00)      Brief Patient Summary      This is a 66-Y/O M w/PMHx of decompensated liver cirrhosis (HE, varices,  recurrent ascites), recurrent C. difficile infection (2 in 2022), Hx of MRSA,  spinal stenosis, untreated hepatitis C, and EtoH use disorder, admitted after  a fall out of bed associated with 3 episodes  of hematemesis secondary to  severe esophagitis and ulcer with distal esophageal varices, transferred to  the ICU for hypovolemic shock i/s/o recurrent C. difficile diarrhea. Patient  is S/P multiple large volume paracenteses with albumin administration. SBP and  hepatorenal syndrome ruled out.      Neurological/Psychiatric      - Patient is alert and oriented      - No suspicion of encephalopathy; no flapping tremors, no confusion; no  focal neurologic signs      - Patient complains of disturbed sleep      - Hx of lower extremities neuropathy i/s/o spinal stenosis      - Patient claims to take gabapentin 600 mg TID; 300 mg TID was prescribed  last year in 2022      Plan:      -  Melatonin HS PRN, for sleep      - Start: gabapentin 100 mg Q12hr      Respiratory      - Patient is not in respiratory distress, no cough. Adequate oxygen  saturation on room air.      - CXR 9/27 negative for PNA, pt does not have respiratory source of  infection      Plan:      - Prevent Aspiration      Cardiovascular      #Shock, hypovolemic (resolved)      - Patient was hypotensive overnight on 07/18/2022; BP improved to 115/65  after 1250 mL N/S were administered      - For now not requiring vasopressor support but will consider Levophed if  needed      - On 07/20/2022: Off IV fluids and has been maintaining BP WNL (110/60s)      Plan      - Monitor BP      - Hold nadolol, spironolactone, furosemide for now      - Consider: resuming nadolol      - Continue: rosuvastatin 5 mg qHS      - 250 cc NS bolus PRN if the patient is hypotensive      GI / Nutrition      #C difficile diarrhea      - C.  difficile [facility] Ag positive and Toxins A and B positive      - On Vancomycin 125 mg PO Q6Hrs - to continue to complete 10 days of  treatment (9/28    10/8)      Plan:      - Continue: Vancomycin 125 mg Q6hr, Day 4 of 10      - Per ID, taper after 10 days: Q12hr for 7 days, daily for 7 days, Q2d for  14 days      #Cirrhosis/liver failure      #Recurrent ascites      - Child-Turcotte-Pugh Class B (8 points)      - Complicated by portal hypertension with recurrent abdominal ascites and  esophageal varices likely 2/2 untreated Hep C and chronic alcohol use  disorder.      - Initial: AFP 3.2, negative ANA, reactive Hep B ab, ASCB negative. IgG  WNL      - Initially started on ceftriaxone for SBP prophylaxis, switched to Zosyn,  then Zosyn stopped when SBP ruled-out. Per ID recs, started on Bactrim for  recurrent SBP prophylaxis      - Ascitic fluid analysis: Clear, Yellow, RBC: 3000, TNC: 32, Lymphocytes:  60. SAAG: 2.7 (suggestive of portal hypertension as the cause of the  ascites).      - Ascitic Fluid Culture: Negative      - On 9/28, large >10 cm diameter hydrocele likely i/s/o ascites      Plan      -  Perform: Therapeutic paracentesis today      - 50 g albumin for large volume paracentesis      -  Continue: trimethoprim/sulfamethoxazole BID (9/30   ), for SBP  prophylaxis      -  Continue: Folic Acid, Multivitamins and Thiamine 100 mg; Tums PRN      -  Continue: GI Soft Diet w/2g Na restriction      Renal / Acid-Base / Electrolytes      #Hyponatremia      - Most recent Na 133      - Renal function BUN/Cr WNL      - Retaining urine   1L, straight cath produced 75ml, UA negative      - Renal consult to rule out hepatorenal syndrome, the team is not  convinced that this is hepatorenal syndrome      - Midodrine 5 mg reduced from TID to BID      - Completed course of Albumin 25g Q8Hr for 6 doses      Plan:      - Replete electrolytes as indicated with goals K>4, Mg>2, phos >3      - Hold spironolactone and Lasix      -  Decrease Midodrine to 5 mg BID      Endocrine      - Largely euglycemic throughout ICU course      Plan:      NAE      Infectious Disease      #Hypovolemic shock (resolved)      #C. difficile infection, recurrent      - Patient was hypotensive and had fever. Met shock criteria, determined to  be hypovolemic. ID workup found C.diff infection, no UTI or URTI      - Fecal Microbiota Transplant recommended by ID in light of the patient's  recurrent C. difficile diarrhea      - The treatment is not performed by GI team here      Plan:      - Continue: Vancomycin 125 mg Q6hr, Day 4 of 10      - Taper after 10 days: Q12hr for 7 days, daily for 7 days, Q2d for 14 days      Hematology/Oncology      #Pancytopenia      - Stable      Plan:      - Monitor CBC      ICU Checklist      -  DVT prophylaxis: High Bleeding Risk      -  GI prophylaxis: PPI (omeprazole)      -  Foley: No      -  Lines: PIV (Right), Midline (Left)      -  Glucose Controlled (Goal < 180 mg/dL): Yes      -  Nutrition: Soft Diet -- 07/20/2022 15:36:00 EDT, Na: 2 gm Sodium, Pt  would like Cola, No Ginger Ale. Requesting fish, chicken noodle soup, beef  stew if available      -  Antibiotic De-escalation: N/A      -  IV medications transitioned to PO: Yes      -  Mechanical Ventilation: N/A      -  Laboratory Test(s) for Next AM: CBC, BMP,  Mg, PO4      -  Code Status: Full Code      -  HCP/Family: Angelique Blonder (Sister), [phone]; Efraim Kaufmann (Daughter),  [phone]      -  HCP/Family Updated: Yes      -  HCP Invoked: No      -  PCP: PCP, No,      -  Language(s) Spoken: English      The patient's assessment and plan were discussed with ICU attending  physician KONTER MD, JASON.      For questions regarding the documentation of this progress note, please do  not hesitate to contact Mamie Laurel, PGY-1, at [phone] (also available via [employer]  Connect Messenger).      ____________________________      Jesse Sans Dora Sims, MD (he/him/his)      PGY-1, Internal Medicine      Pager:  [phone] (also available via [employer] Connect Messenger)      Significant portions of this note may have been generated with a voice  recognition program and may include transcription errors not otherwise  corrected. Sometimes, these errors may affect the content or meaning of a  given sentence.    ATTESTATION:    I saw and examined the patient, reviewed the labs and imaging, and  formulated the plan with the housestaff. I agree with the above. Briefly, 22m  with HCV and etoh cirrhosis, recurrent C. diff here with abdominal pain,  weakness, hematemesis. Found to be in hypovolemic shock, possibly from acute  GIB (EGD with esophagitis and gastritis, small and non-bleeding varices),  acute C. diff infection. On BID PPI and Hgb now stable. On PO vanco x 10d for  C. diff. Volume status remains challenging with recurrent large volume ascites  and intermittent hypotension (likely from volume shifts). Tx paracentesis  today with albumin after. On BID PPI, following daily Hgb, has good IV access.  On TMP-SMX for SBP prophylaxis. As BP improves, will resume home propranolol  and diuretics. On midodrine to help with hypotension, presumed to be from  cirrhosis with splanchnic vasodilation and fluid shifts, not currently septic.   Critical care time 40 minutes.  Electronically Signed On 07/23/22 13:49 EDT  _____________________________________________   Dora Sims MD, Maisie Fus    Electronically Signed On 07/23/22 17:36 EDT  _____________________________________________   Marijo Conception MD, Elissa Hefty    Patient account number:  [id number]  Ordering physician:  ,    Other providers:  Brien Mates, CATHERINE HORWITZ, KIMBERLY NKEMAKOLAM, JOSHUA HUNDERT  No PCP,  ,  ,

## 2022-07-24 NOTE — Progress Notes (Addendum)
Progress Note EMR  DATE/TIME NOTE CREATED: 07/24/2022 06:23:41  DATE/TIME PATIENT SEEN:   07/24/2022 07:11:58  SUBJECTIVE:      Date of Admission: 07/13/2022      Date of Transfer to ICU: 07/19/2022      Hospital Day: 11      ICU Day: 5      MV Day: N/A      POD: N/A      Antibiotic(s)      Anti-Infective Medication(s) (2) Active      sulfamethoxazole-trimethoprim (Bactrim DS 800 mg-160mg )  1 tab, Oral, BID      vancomycin  125 mg = 1 cap, Oral, QID      Consultants Involved      HUNDERT MD, Angie Fava MD, Mayford Knife MD, Rashae Rother; Lenis Dickinson MD,  [facility]      This is a 66 year old male with a PMHx of untreated hepatitis C,  decompensated liver cirrhosis (recurrent ascites, hepatic encephalopathy and  esophageal varices), recurrent C-Difficile ( x2 in 2022 ) who presents to the  emergency department via EMS after falling out of bed associated with 3  episodes of hematemesis secondary to severe esophagitis and ulcer with distal  esophageal varices. The patient has a history of similar presentations, the  last being in August 2023 when 13.3L of clear liquid was removed via  paracentesis. During this hospital stay patient initially had two  uncomplicated ultrasound guided paracenteses three days apart yielding a total  of 25 liters of clear, straw-colored ascitic fluid. Both large volume  paracenteses required albumin administration.      On 9/23 had endoscopy: EGD with severe grade D esophagitis near to his  proximal esophagus with diffuse ulceration and no evidence of active bleeding.  Appears to have small esophageal varices as well. Diffuse gastritis, and  abnormal mucosa in his duodenum that was biopsied. Suspect bleeding is  secondary to his esophagitis more so than his varices. He was started on a  high dose PPI, octreotide infusion, sucralfate and ceftriaxone for SBP  prophylaxis. However since unlikely patient had esophageal variceal bleeding,  octreotide was discontinued since 9/24. It  seems that he's been stable H/H  since then and no reported bleeding events. Nadolol started on 9/27. From a  renal standpoint, the nephrology team does not believe the patient has  hepatorenal syndrome. Patient given albumin 25g Q8H for 6 doses.      Patient was transferred to ICU for hypotension and potential need for  pressor support. He was administered IVF which showed improvement in BP. He  reported abdominal pain and persistent loose stools in the setting of pyrexia.  Infectious and shock workup were performed. UA and CXR negative for UTI and  PNA respectively. Patient was diagnosed with hypovolemia due to persistent  diarrhea iso C.difficile. He was started on vancomycin 125 mg PO Q6 Hr and SBP  prophylaxis was switched to bactrim. He had another therapeutic paracentesis  on 9/28 with albumin administration, fluid sent was consistent with transudate  making SBP unlikely. GI consulted for fecal microbiota transplant per ID  recommendations, however they are unable to perform FBT. Patient will continue  on a slow vancomycin taper. On 07/23/2022, a large volume paracentesis was  performed, with 9.7L removed. He usually needs regular paracentesis. He should  follow up with Dr. Marnette Burgess in clinic.      24 Hour Events      Day:      - Started  gabapentin 100 mg BID      - Continue Vancomycin and to taper according to ID recommendations      - Therapeutic paracentesis w/9.7L removed      Overnight Event(s)      - Nausea before bedtime; PO Zofran given      Intake/Output          Recorded           Input           Output               Balance          24hr total           +2676           -10500               -7824      Subjective      Patient seen at bedside today. Had some nausea before bedtime, was given  oral Zofran and nausea did not disturb sleep. Some tingling in the lower  extremities. Desires hamburger, rather than soft foods.   REVIEW OF SYSTEMS:       A multi-point review of systems was performed, and was  negative except as  documented above.  OBJECTIVE:   VITAL SIGNS:        Vital Signs:        Last Charted:        24 Hr Minimum:        24 Hr Maximum:        Temperature Oral        36.7        (10/03 06:00)        36.7        (10/03 06:00)        36.2        (10/02 08:00)        Heart Rate        62        (10/03 06:00)        61        (10/03 02:00)        81        (10/02 18:00)        Cardiac Rhythm        Normal sin        (10/02 20:00)          Pulse/HR Method        Monitor        (10/02 20:00)          Respiratory Rate        19        (10/03 06:00)        12 (L)        (10/02 10:15)        21 (H)        (10/02 22:00)        Systolic BP        115        (10/03 06:00)        92        (10/02 15:00)        127        (10/02 08:00)        Diastolic BP        61        (16/10 06:00)        44 (L)        (  10/02 15:00)        80        (10/02 08:00)        Mean Arterial Pressure        79        (10/03 06:00)        60        (10/02 15:00)        96        (10/02 08:00)        SpO2/Pulse Oximetry        99        (10/03 06:00)        97        (10/03 00:00)        99        (10/02 20:00)   PHYSICAL EXAM:       Vital Signs: stable, afebrile, not tachycardic, not tachypneic,  normotensive       General: well nourished, well developed, awake and alert, resting  comfortably in no acute distress       Head: grossly normocephalic, atraumatic       Eyes: normal inspection, extraocular muscles intact, no conjunctival  pallor, no jaundice       Ear, Nose, Throat: normal external appearance       Neck: normal range of motion, no step-offs       Respiratory: no respiratory distress, lungs CTA bilaterally       Cardiovascular: not tachycardic, RRR without murmur appreciated, normal  S1/S2       GI: Abdomen highly distended/turgid, mildly tender diffusely with no  guarding or rebound; +BS diminished 4, fluid wave present       Back: Normal inspection of the back with good strength and range of  motion throughout all  extremities       Extremities: pulses intact with good cap refills, no LE pitting edema or  calf tenderness       Neurological: alert and oriented to person, place, and time,  appropriately conversive, with 5/5 bilateral UE/LE strength, no gross motor or  sensory defects noted. Coordination appears adequate       Skin: Warm, dry, and intact; no noted rash; discolored nails bilaterally  across fingers and toes   LAB RESULTS:        Hematology Basic - Last 36 hours (21)        Result        Date/Time        WBC        2.3 (L)         10/03 05:30        RBC        2.77 (L)         10/03 05:30        Hgb        9.7 (L)         10/03 05:30        Hct        28.4 (L)         10/03 05:30        MCV        102.5 (H)         10/03 05:30        MCH        35.0 (H)         10/03 05:30        MCHC  34.2         10/03 05:30        RDW        14.5         10/03 05:30        Platelet Count        73 (L)         10/03 05:30        NRBC Auto Abs        0.00         10/03 05:30        NRBC Auto Rel        0.0         10/03 05:30        Neutrophil Rel        58.7         10/03 05:30        Lymphocyte Rel        15.9         10/03 05:30        Monocyte Rel        20.6         10/03 05:30        Eosinophil Rel        3.0         10/03 05:30        Basophil Rel        0.9         10/03 05:30        Neutrophil Abs        1.37 (L)         10/03 05:30        Lymphocyte Abs        0.37 (L)         10/03 05:30        Monocyte Abs        0.48         10/03 05:30        Eosinophil Abs        0.07         10/03 05:30        Basophil Abs        0.02         10/03 05:30          Chemistry Comprehensive - Last 36 hours (17)        Result        Date/time        Sodium Lvl        133 (L)         10/02 05:22        Potassium Lvl        4.3         10/02 05:22        Chloride Lvl        102         10/02 05:22        CO2        21 (L)         10/02 05:22        Glucose Level        93         10/02 05:22        BUN        10         10/02  05:22        Creatinine Lvl  0.69         10/02 05:22        AGAP        10         10/02 05:22        Total Protein        5.2 (L)         10/02 05:22        Albumin Lvl        3.6         10/02 05:22        Calcium Lvl        7.5 (L)         10/02 05:22        Alk Phos        94         10/02 05:22        AST        63 (H)         10/02 05:22        ALT        32         10/02 05:22        Magnesium Lvl        1.9         10/02 05:22        Phosphate        2.6 (L)         10/02 05:22        Bili Total        1.1 (H)         10/02 05:22     RADIOLOGY/DIAGNOSTIC RESULTS:    Radiology - Last 36 hours (0)    No results in past 36 hours      Diagnostics - Non-Radiology (1)    ED EKG-CernerCV (09/22 06:40)    Please review the final report in the patient's chart.    DISPOSITION PLANNING:   OT recommended subacute rehab and/or swing bed  ASSESSMENT/PLAN:       Ascites (R18.8)       Elevated troponin (R77.8)       Esophagitis (K20.90)       Gastritis (K29.70)       Mucosal abnormality of duodenum (K31.9)       Varices of esophagus determined by endoscopy (I85.00)       Orders:         Comprehensive Metabolic Panel         Magnesium Level         Phosphate Level      Brief Patient Summary      This is a 66-Y/O M w/PMHx of decompensated liver cirrhosis (HE, varices,  recurrent ascites), recurrent C. difficile infection (2 in 2022), Hx of MRSA,  spinal stenosis, untreated hepatitis C, and EtoH use disorder, admitted after  a fall out of bed associated with 3 episodes of hematemesis secondary to  severe esophagitis and ulcer with distal esophageal varices, transferred to  the ICU for hypovolemic shock i/s/o recurrent C. difficile diarrhea. Patient  is S/P multiple large volume paracenteses with albumin administration. SBP and  hepatorenal syndrome ruled out.      Neurological/Psychiatric      - Patient is alert and oriented      - No suspicion of encephalopathy; no flapping tremors, no confusion; no  focal neurologic  signs      - Patient complains of disturbed sleep      -  Hx of lower extremities neuropathy i/s/o spinal stenosis      - Patient claims to take gabapentin 600 mg TID; 300 mg TID was prescribed  last year in 2022      Plan:      -  Melatonin HS PRN, for sleep      - Continue: gabapentin 100 mg Q12hr      Respiratory      - Patient is not in respiratory distress, no cough. Adequate oxygen  saturation on room air.      - CXR 9/27 negative for PNA, pt does not have respiratory source of  infection      Plan:      - Prevent Aspiration      Cardiovascular      #Shock, hypovolemic (resolved)      - Patient was hypotensive overnight on 07/18/2022; BP improved to 115/65  after 1250 mL N/S were administered      - For now not requiring vasopressor support but will consider Levophed if  needed      - On 07/20/2022: Off IV fluids and has been maintaining BP WNL (110/60s)      Plan      - Monitor BP      - Start: propranolol 20 mg BID      - Continue: rosuvastatin 5 mg qHS      - 250 cc NS bolus PRN if the patient is hypotensive      GI / Nutrition      #C difficile diarrhea      - C. difficile [facility] Ag positive and Toxins A and B positive      - On Vancomycin 125 mg PO Q6Hrs - to continue to complete 10 days of  treatment (9/28    10/8)      Plan:      - Continue: Vancomycin 125 mg Q6hr, Day 4 of 10      - Per ID, taper after 10 days: Q12hr for 7 days, daily for 7 days, Q2d for  14 days      #Cirrhosis/liver failure      #Recurrent ascites      - Child-Turcotte-Pugh Class B (8 points)      - Complicated by portal hypertension with recurrent abdominal ascites and  esophageal varices likely 2/2 untreated Hep C and chronic alcohol use  disorder.      - Initial: AFP 3.2, negative ANA, reactive Hep B ab, ASCB negative. IgG  WNL      - Initially started on ceftriaxone for SBP prophylaxis, switched to Zosyn,  then Zosyn stopped when SBP ruled-out. Per ID recs, started on Bactrim for  recurrent SBP prophylaxis      - Ascitic fluid  analysis: Clear, Yellow, RBC: 3000, TNC: 32, Lymphocytes:  60. SAAG: 2.7 (suggestive of portal hypertension as the cause of the ascites).      - Ascitic Fluid Culture: Negative      - On 9/28, large >10 cm diameter hydrocele likely i/s/o ascites      - Was briefly on nadolol 20 mg on 07/18/2022      - MELD-Na Score (07/19/2022): 18 points (3-4% 90-Day Mortality)      Plan      -  Continue: propranolol 20 mg BID for variceal prophylaxis      -  Continue: trimethoprim/sulfamethoxazole BID (9/30   ), for SBP  prophylaxis      -  Continue: Folic Acid, Multivitamins and Thiamine 100  mg; Tums PRN      -  Continue: GI Soft Diet w/2g Na restriction, with modifications to  liberalization of foods, per RD      - Replete phosphate as needed; encourage oral intake      Renal / Acid-Base / Electrolytes      #Hyponatremia      - Most recent Na 130, baseline sodium ranges between 130-134      - Renal function BUN/Cr WNL      - Retaining urine   1L, straight cath produced 75ml, UA negative      - Renal consult to rule out hepatorenal syndrome, the team is not  convinced that this is hepatorenal syndrome      - Midodrine 5 mg reduced from TID to BID      - Completed course of Albumin 25g Q8Hr for 6 doses      Plan:      - Replete electrolytes as indicated with goals K>4, Mg>2, phos >3      - Resume: Aldactone 50 mg Q Evening and Lasix 20 mg daily PO      - Continue: midodrine to 5 mg BID      Endocrine      - Largely euglycemic throughout ICU course      Plan:      NAE      Infectious Disease      #C. difficile infection, recurrent      - Patient was hypotensive and had fever. Met shock criteria, determined to  be hypovolemic. ID workup found C.diff infection, no UTI or URTI      - Fecal Microbiota Transplant recommended by ID in light of the patient's  recurrent C. difficile diarrhea; would need colonoscopic procedure here      - The treatment is not performed by GI team here      Plan:      - Continue: Vancomycin 125 mg Q6hr, Day  5 of 10      - Taper after 10 days: Q12hr for 7 days, daily for 7 days, Q2d for 14 days      Hematology/Oncology      #Pancytopenia      - Stable, WBC, PLT, Hb near baseline levels      Plan:      - Monitor CBC      ICU Checklist      -  DVT prophylaxis: High Bleeding Risk      -  GI prophylaxis: PPI (omeprazole)      -  Foley: No      -  Lines: PIV (Right), Midline (Left)      -  Glucose Controlled (Goal < 180 mg/dL): Yes      -  Nutrition: Soft Diet -- 07/20/2022 15:36:00 EDT, Na: 2 gm Sodium, Pt  would like Cola, No Ginger Ale. Requesting fish, chicken noodle soup, beef  stew if available      -  Antibiotic De-escalation: N/A      -  IV medications transitioned to PO: Yes      -  Mechanical Ventilation: N/A      -  Laboratory Test(s) for Next AM: CBC, BMP, Mg, PO4      -  Code Status: Full Code      -  HCP/Family: Angelique Blonder (Sister), [phone]; Efraim Kaufmann (Daughter),  [phone]      -  HCP/Family Updated: Yes      -  HCP Invoked: No      -  PCP: PCP, No,      -  Language(s) Spoken: English      The patient's assessment and plan were discussed with ICU attending  physician KONTER MD, JASON.      For questions regarding the documentation of this progress note, please do  not hesitate to contact Mamie Laurel, PGY-1, at [phone] (also available via AMS  Connect Messenger).      ____________________________      Jesse Sans Dora Sims, MD (he/him/his)      PGY-1, Internal Medicine      Pager: [phone] (also available via AMS Connect Messenger)      Significant portions of this note may have been generated with a voice  recognition program and may include transcription errors not otherwise  corrected. Sometimes, these errors may affect the content or meaning of a  given sentence.  ATTESTATION:   I saw and examined the patient, reviewed the labs and imaging, and formulated  the plan with the housestaff. I agree with the above. Briefly, 1m with HCV  and etoh cirrhosis, recurrent C. diff here with abdominal pain,  weakness,  hematemesis. Found to be in hypovolemic shock, possibly from acute GIB (EGD  with esophagitis and gastritis, small and non-bleeding varices), acute C. diff  infection. On BID PPI and Hgb now stable. On PO vanco x 10d for C. diff;  considering fecal xpl. Volume status remains challenging with recurrent large  volume ascites and intermittent hypotension (likely from volume shifts). Tx  paracentesis 10/2 with albumin after; resuming home diuretics; will d/w GI if  should consider outpatient TIPS, HCV tx. On BID PPI, following daily Hgb, has  good IV access. On TMP-SMX for SBP prophylaxis. Resuming propranolol. On  midodrine to help with hypotension, presumed to be from cirrhosis with  splanchnic vasodilation and fluid shifts, not currently septic.   Critical care time 40 minutes.  Electronically Signed On 07/24/22 13:11 EDT  _____________________________________________   Dora Sims MD, [facility]    Electronically Signed On 07/24/22 17:43 EDT  _____________________________________________   Marijo Conception MD, Elissa Hefty    Patient account number:  [id number]  Ordering physician:  ,    Other providers:  Brien Mates, CATHERINE HORWITZ, KIMBERLY NKEMAKOLAM, JOSHUA HUNDERT  No PCP,  ,  ,

## 2022-07-24 NOTE — Progress Notes (Addendum)
Progress Note EMR  DATE/TIME NOTE CREATED: 07/24/2022 22:34:44  DATE/TIME PATIENT SEEN:   SERVICE TRANSFER NOTE   Date of Admission: 07/13/22   Date of Transfer to the ICU: 07/19/22   Date of transfer out of the ICU: 07/24/2022   Consultants involved in Care:   GI, ID, Nephrology   Transfer Diagnosis:   #Hypotension, likely related to hypovolemia in the setting of diarrhea and  large volume paracentesis-improved   #C.diff associated diarrhea (2nd recurrence)   #Hematemesis d/t UGIB   #Severe esophagitis and gastritis   #small distal esophageal varices   #Alcohol use disorder, reportedly stopped drinking 2 weeks before admission   #Decompensated liver cirrhosis with recurrent ascites and weekly scheduled  paracentesis   #Portal hypertension   Secondary Transfer Diagnosis:   #Untreated HCV   #Lower extremities neuropathy i/s/o spinal stenosis   Follow up plans:   [] Continue Vancomycin p.o. taper as follows:   125 mg orally 4 times daily for 10  days (09/28-10/08/23), then   125 mg orally 2 times daily for 7 days (10/09-10/15/23), then   125 mg orally once daily for 7 days (10/16-10/22/23), then   125 mg orally every 2  days for 2 weeks (10/23-11/6/23)   [] Continue Bactrim 1 DS tab PO BID for SBP prophylaxis   [] Continue Carafate 2000mg  PO TID for 14 days total (9/24- expected end  07/28/22)   [] Continue Omeprazole 40mg  PO BID indefinitely for severe GERD   []  EGD biopsies f/u   []  GI recommendations, TIPS to be considered as outpatient. Final outpatient  plan by GI. We resumed diuretics lower dose   []  Consider titrating down midodrine to 5 mg daily.   []  PT/OT recommended short term rehab   Admission History and Hospital Course:   For the details of the history of present illness, past medical history,  family history, social history, please refer to History & Physical by Dr.  Ellison Hughs on behalf of Dr. Orvan July.   In summary, this is a 66 year old male with a past medical history of  untreated hepatitis C and history of  alcohol use, decompensated hepatic  cirrhosis complicated by portal hypertension with recurrent abdominal ascites  requiring weekly paracentesis, recurrent C-difficile (two episodes in 2022),  hypertension and spinal stenosis (with associated neuropathy) who was admitted  to the medical floors on 9/22 for hematemesis.   He presented to the ED after x3 episodes of hematemesis occurred after a fall  from bed. He was hemodynamically stable, HH 11.8/32.5. He was also found to  have significant ascites and diffuse abdominal pain and noted he had missed  his paracentesis appointment a week prior. He underwent an US guided  paracentesis (07/13/2022) which removed 13 L of clear straw colored fluid,  following that he was given 50g of albumin. His abdominal discomfort improved  following paracentesis. Ascitic fluid studies were consistent with transudate,  did not show any evidence of spontaneous bacterial peritonitis (WCC 59).  Second paracentesis before transfer to the ICU performed on 9/25 with 12  liters of straw-colored ascitic fluid  removed. His hemoglobin has been stable  throughout admission. He had an endoscopy 9/23 which showed severe grade D  esophagitis near his proximal esophagus with diffuse ulceration and no  evidence of active bleeding. 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 Small intestine biopsies were  taken. He was started on ceftriaxone 1 g daily for SBP prophylaxis. Given his  esophageal varices, he received  3 days of octreotide, discontinued 9/24 as the  bleeding was unlikely due to the varices. A duplex ultrasound of his abdomen  did not show any evidence of portal venous thrombosis or HCC. His liver  transaminases are deranged, so we sent studies for ANA, anti smooth muscle Ab,  anti mitochondrial Ab, immunoglobin G, AFP and iron studies. Iron and TIBC  were low at 45 and 127 respectively while other iron studies were within  normal limits.  HBsAb was reactive. All other serology were found to be within  normal limits (negative). There has been no recurrence of hematemesis  throughout the hospital stay and his Hgb has been stable ranging from 9 to 11.  Because he did not have bleeding after 5 days, low dose nadolol 20mg  daily for  esophageal varices was started on 9/27. MELD-Na Score (calculated 09/28): was  18 points (3-4% 90-Day Mortality).   On 9/28 the patient was transferred to the ICU for fever and hypotension with  potential need for pressor support. Nadolol, lasix and spironolactone were  stopped. The fever and shock were initially suspected to be caused by SBP.  Therefore, ceftriaxone was stopped  (5 days of treatment completed), and the  patient was given 2 doses of Zosyn. At the same time, the patient was having  many episodes of diarrhea and a C.diff PCR sent was found to be positive. The  patient was diagnosed with hypotension due to hypovolemia due to persistent  diarrhea in the setting of C.difficile infection, likely associated with his  recent course of ceftriaxone. Zosyn was stopped and vancomycin  po was  initiated on 9/28 for recurrent C.difficile infection.Contributing factor  might have also been large volume paracentesis volume shifts.  Patient  underwent two therapeutic paracenteses while in the ICU with fluid removal of  4.2L and 9.7L removed respectively. Last paracentesis on 07/23/2022. Bactrim  was started for SBP prophylaxis on 9/30 with guidance from ID and GI teams.  From a renal standpoint patient had a mild creatinine elevation compared to  baseline (0.2 increase), and the nephrology team was consulted. They do not  believe the patient has hepatorenal syndrome. Patient was started on midodrine  PO TID for blood pressure support. It has been decreased to 5 mg po BID now.  Nephrology recommended giving the patient albumin 25g Q8H for 6 doses,  completed on 10/2. Patient blood pressure has remained stable and diarrhea  is  improving. Discussion about fecal transplant for C diff second recurrence was  discussed with ID and GI, but currently not available in the hospital.  Propranolol 20 mg po BID was started on 10/2 with good tolerance. Also,  furosemide 20 mg daily and spironolactone 50 mg daily have been resumed on  07/24/2022.   Mr Bachtel has not had any further nausea or vomiting since admission. His  hemoglobin has remained stable. He continues to have severe heartburn for  which he should continue high dose proton-pump inhibitor indefinitely and  Carafate to complete 14 days. He will continue on a slow vancomycin taper for  his recurrent C.difficile infection. For his recurrent ascites, he should  follow up with Dr. Marnette Burgess in clinic for scheduling of regular therapeutic  paracenteses and consideration of TIPS procedure. He was seen by PT/OT who  recommend short term rehabilitation.   Discussed with Dr.Konter   Drue Stager, MD PGY-3  OBJECTIVE:   VITAL SIGNS:        Vital Signs:  Last Charted:        24 Hr Minimum:        24 Hr Maximum:        Temperature Axillary        36.6        (10/03 15:38)        36.6        (10/03 15:38)        36.6        (10/03 15:38)        Temperature Oral        36.7        (10/03 20:00)        36.7        (10/03 20:00)        36.7        (10/03 06:00)        Heart Rate        62        (10/03 21:53)        57 (L)        (10/03 08:00)        79        (10/03 18:30)        Cardiac Rhythm        Normal sin        (10/03 21:53)          Pulse/HR Location        Apical        (10/03 17:58)          Pulse/HR Method        Monitor        (10/03 20:00)          Respiratory Rate        18        (10/03 08:00)        12 (L)        (10/03 00:00)        19        (10/03 06:00)        Systolic BP        112        (10/03 21:53)        91        (10/03 12:00)        118        (10/03 17:58)        Diastolic BP        52 (L)        (10/03 21:53)        45 (L)        (10/03 15:38)        62         (10/03 17:58)        Mean Arterial Pressure        79        (10/03 18:30)        62        (10/03 02:00)        79        (10/03 06:00)        Blood Pressure Location        Right, Upp        (10/03 18:30)          Blood Pressure Method        Automatic        (10/03 15:38)          SpO2/Pulse  Oximetry        97        (10/03 21:53)        96        (10/03 12:00)        99        (10/03 06:00)   PHYSICAL EXAM:       PHYSICAL EXAM:       General:  in no acute distress       Head: grossly normocephalic, atraumatic       Eyes: normal inspection, no jaundice       Ear, Nose, Throat: normal external appearance, Moist mucous membranes       Neck: normal range of motion       Respiratory: no respiratory distress, lungs CTA bilaterally       Cardiovascular: RRR without murmur appreciated, normal S1/S2       GI: Abdomen distended/turgid, mildly tender diffusely with no guarding or  rebound; +BS       Back: Normal inspection of the back with good strength and range of  motion throughout all extremities       Extremities: pulses intact with good cap refills, no LE pitting edema or  calf tenderness       Neurological: alert and oriented to person, place, and time,  appropriately conversive, with 5/5 bilateral UE/LE strength       Skin: Warm, dry, and intact; no noted rash; discolored nails bilaterally  across fingers and toes   LAB RESULTS:        Hematology Basic - Last 36 hours (21)        Result        Date/Time        WBC        2.3 (L)         10/03 05:30        RBC        2.77 (L)         10/03 05:30        Hgb        9.7 (L)         10/03 05:30        Hct        28.4 (L)         10/03 05:30        MCV        102.5 (H)         10/03 05:30        MCH        35.0 (H)         10/03 05:30        MCHC        34.2         10/03 05:30        RDW        14.5         10/03 05:30        Platelet Count        73 (L)         10/03 05:30        NRBC Auto Abs        0.00         10/03 05:30        NRBC Auto Rel        0.0          10/03 05:30        Neutrophil Rel  58.7         10/03 05:30        Lymphocyte Rel        15.9         10/03 05:30        Monocyte Rel        20.6         10/03 05:30        Eosinophil Rel        3.0         10/03 05:30        Basophil Rel        0.9         10/03 05:30        Neutrophil Abs        1.37 (L)         10/03 05:30        Lymphocyte Abs        0.37 (L)         10/03 05:30        Monocyte Abs        0.48         10/03 05:30        Eosinophil Abs        0.07         10/03 05:30        Basophil Abs        0.02         10/03 05:30          Chemistry Comprehensive - Last 36 hours (17)        Result        Date/time        Sodium Lvl        130 (L)         10/03 05:30        Potassium Lvl        4.4         10/03 05:30        Chloride Lvl        100         10/03 05:30        CO2        19 (L)         10/03 05:30        Glucose Level        97         10/03 05:30        BUN        11         10/03 05:30        Creatinine Lvl        0.71         10/03 05:30        AGAP        11         10/03 05:30        Total Protein        4.9 (L)         10/03 05:30        Albumin Lvl        4.0         10/03 05:30        Calcium Lvl        7.7 (L)         10/03 05:30        Alk Phos  86         10/03 05:30        AST        59 (H)         10/03 05:30        ALT        27         10/03 05:30        Magnesium Lvl        1.7         10/03 05:30        Phosphate        2.1 (L)         10/03 05:30        Bili Total        1.0         10/03 05:30     RADIOLOGY/DIAGNOSTIC RESULTS:    Radiology - Last 36 hours (0)    No results in past 36 hours      Diagnostics - Non-Radiology (1)    ED EKG-CernerCV (09/22 06:40)    Please review the final report in the patient's chart.  Electronically Signed On 07/25/22 06:29 EDT  _____________________________________________   Lajuana Matte MD, ALEXANDRA    Electronically Signed On 07/25/22 19:04 EDT  _____________________________________________   Marijo Conception MD, Elissa Hefty    Patient account  number:  [id number]  Ordering physician:  ,    Other providers:  JASON SON, CATHERINE HORWITZ, KIMBERLY NKEMAKOLAM, JOSHUA HUNDERT  No PCP,  ,  ,

## 2022-07-26 NOTE — Progress Notes (Addendum)
Progress Note EMR  DATE/TIME NOTE CREATED: 07/26/2022 11:49:24  SUBJECTIVE:      Overnight: NAE      Today, the patient was alerted and oriented x 3 in NAD. He said that he  felt great, was eating breakfast comfortably.   REVIEW OF SYSTEMS:       denies fever, chilld, nasueas, vomiting, abdominal pain, watery diarrhea.  OBJECTIVE:   VITAL SIGNS:        Vital Signs:        Last Charted:        24 Hr Minimum:        24 Hr Maximum:        Temperature Oral        36.6        (10/05 07:43)        36.6        (10/05 07:43)        36.4        (10/04 12:03)        Heart Rate        54 (L)        (10/05 07:43)        54 (L)        (10/05 07:43)        65        (10/04 20:41)        Cardiac Rhythm        Normal sin        (10/05 07:43)          Respiratory Rate        18        (10/05 07:43)        13 (L)        (10/05 04:27)        20        (10/04 14:00)        Systolic BP        95        (16/10 07:43)        95        (10/05 07:43)        126        (10/04 12:00)        Diastolic BP        58 (L)        (10/05 07:43)        50 (L)        (10/04 20:41)        68        (10/04 12:00)        Mean Arterial Pressure        70        (10/05 07:43)        66        (10/04 20:41)        87        (10/04 12:00)        SpO2/Pulse Oximetry        97        (10/05 07:43)        96        (10/04 15:00)        99        (10/04 12:00)   PHYSICAL EXAM:       General:  in no acute distress       Head: grossly normocephalic, atraumatic       Eyes: normal inspection, no  jaundice       Ear, Nose, Throat: normal external appearance, Moist mucous membranes       Neck: normal range of motion       Respiratory: no respiratory distress, lungs CTA bilaterally       Cardiovascular: RRR without murmur appreciated, normal S1/S2       GI: Abdomen distended/turgid, mildly tender diffusely with no guarding or  rebound; +BS       Back: Normal inspection of the back with good strength and range of  motion throughout all extremities       Extremities:  pulses intact with good cap refills, no LE pitting edema or  calf tenderness       Neurological: alert and oriented to person, place, and time,  appropriately conversive, with 5/5 bilateral UE/LE strength       Skin: Warm, dry, and intact; no noted rash; discolored nails bilaterally  across fingers and toes   LAB RESULTS:        Hematology Basic - Last 36 hours (21)        Result        Date/Time        WBC        3.3 (L)         10/05 05:40        RBC        3.02 (L)         10/05 05:40        Hgb        10.6 (L)         10/05 05:40        Hct        30.7 (L)         10/05 05:40        MCV        101.7 (H)         10/05 05:40        MCH        35.1 (H)         10/05 05:40        MCHC        34.5         10/05 05:40        RDW        14.7 (H)         10/05 05:40        Platelet Count        110 (L)         10/05 05:40        NRBC Auto Abs        0.00         10/05 05:40        NRBC Auto Rel        0.0         10/05 05:40        Neutrophil Rel        52.0         10/05 05:40        Lymphocyte Rel        21.6         10/05 05:40        Monocyte Rel        21.0         10/05 05:40        Eosinophil Rel        2.7  10/05 05:40        Basophil Rel        1.2         10/05 05:40        Neutrophil Abs        1.71 (L)         10/05 05:40        Lymphocyte Abs        0.71 (L)         10/05 05:40        Monocyte Abs        0.69         10/05 05:40        Eosinophil Abs        0.09         10/05 05:40        Basophil Abs        0.04         10/05 05:40          Chemistry Comprehensive - Last 36 hours (17)        Result        Date/time        Sodium Lvl        129 (L)         10/05 05:40        Potassium Lvl        5.0         10/05 05:40        Chloride Lvl        100         10/05 05:40        CO2        21 (L)         10/05 05:40        Glucose Level        90         10/05 05:40        BUN        14         10/05 05:40        Creatinine Lvl        0.82         10/05 05:40        AGAP        8 (L)         10/05  05:40        Total Protein        5.0 (L)         10/05 05:40        Albumin Lvl        3.5         10/05 05:40        Calcium Lvl        8.0 (L)         10/05 05:40        Alk Phos        108         10/05 05:40        AST        74 (H)         10/05 05:40        ALT        36         10/05 05:40        Magnesium Lvl        1.7  10/04 04:27        Phosphate        2.9         10/04 04:27        Bili Total        1.0         10/05 05:40     RADIOLOGY/DIAGNOSTIC RESULTS:    Radiology - Last 36 hours (0)    No results in past 36 hours      Diagnostics - Non-Radiology (1)    ED EKG-CernerCV (09/22 06:40)    Please review the final report in the patient's chart.    ASSESSMENT/PLAN:       Ascites (R18.8)       Elevated troponin (R77.8)       Esophagitis (K20.90)       Gastritis (K29.70)       Mucosal abnormality of duodenum (K31.9)       Varices of esophagus determined by endoscopy (I85.00)      This is 66 years old femalePMHx of decompensated liver cirrhosis (HE,  varices, recurrent ascites), recurrent C. difficile infection (2 in 2022), Hx  of MRSA, spinal stenosis, untreated hepatitis C, and EtoH use disorder,  admitted after a fall out of bed associated with 3 episodes of hematemesis  secondary to severe esophagitis and ulcer with distal esophageal varices,  transferred to the ICU for hypovolemic shock i/s/o recurrent C. difficile  diarrhea. Patient is S/P multiple large volume paracenteses with albumin  administration. SBP and hepatorenal syndrome ruled out.      #UGIB 2/2 severe esophagitis.      #Refractory hypovolemia, i/s/o multiple paracentesis and Cdiff diarrhea.  (resolved)      -The patient presented to the ER due to episode of hematemesis 2/2 severe  esophagitis.      -During the course the patient had 2 paracentesis before being transfered  to the ICU, draining almost 28L.      -Patient was hypotensive overnight on 07/18/2022; BP improved to 115/65  after 1250 mL N/S were administered. The patient  was transferred to the ICU  for possible pressor use, but never been administered.      -On 07/20/2022: Off IV fluids and has been maintaining BP WNL (110/60s)      -The patient has recurrent Cdiff, with extensive course of vancomycin.      Plan      - Monitor BP      - continue with propranolol 20 mg BID      - Continue with rosuvastatin 5 mg qHS      - 250 cc NS bolus PRN if the patient is hypotensive      - Continue: Vancomycin 125 mg Q6hr, Day 6 of 10. Per ID, taper after 10  days: Q12hr for 7 days, daily for 7 days, Q2d for 14 days      -Continue Carafate 2000mg  PO TID for 14 days total (9/24- expected end  07/28/22)      -Continue Omeprazole 40mg  PO BID indefinitely for severe GERD      #Cirrhosis/liver failure      #Recurrent ascites with paracentesis e/week.      - Child-Turcotte-Pugh Class B (8 points)      - Complicated by portal hypertension with recurrent abdominal ascites and  esophageal varices likely 2/2 untreated Hep C and chronic alcohol use  disorder.      - Initial: AFP 3.2, negative ANA, reactive Hep B ab, ASCB negative. IgG  WNL      -  Initially started on ceftriaxone for SBP prophylaxis, switched to Zosyn,  then Zosyn stopped when SBP ruled-out. Per ID recs, started on Bactrim for  recurrent SBP prophylaxis      - Ascitic fluid analysis: Clear, Yellow, RBC: 3000, TNC: 32, Lymphocytes:  60. SAAG: 2.7 (suggestive of portal hypertension as the cause of the ascites).      - Ascitic Fluid Culture: Negative      - On 9/28, large >10 cm diameter hydrocele likely i/s/o ascites      - MELD-Na Score (07/19/2022): 18 points (3-4% 90-Day Mortality)      Plan      -Continue with propranolol 20 mg BID for variceal prophylaxis      -Continue with trimethoprim/sulfamethoxazole BID (9/30   ), for SBP  prophylaxis      -Continue with Folic Acid, Multivitamins and Thiamine 100 mg; Tums PRN      -Continue with GI Soft Diet w/2g Na restriction, with modifications to  liberalization of foods, per RD      -Replete  phosphate as needed; encourage oral intake      #Hyponatremia      - Hepato renal syndrome ruled out.      - Most recent Na 128, baseline sodium ranges between 130-134      - Renal function BUN/Cr WNL      - Retaining urine   1L, straight cath produced 75ml, UA negative      - Midodrine 5 mg BID was cahnged to daily, will start since tomorrow.      - Completed course of Albumin 25g Q8Hr for 6 doses      Plan:      - Replete electrolytes as indicated with goals K>4, Mg>2, phos >3      - Resume with Aldactone 50 mg Q Evening and Lasix 20 mg daily PO      - Continue with midodrine to 5 mg BID change to daily.      CODE STATUS: Full code      Contact: [HCP/NOK] N/A      DVT PROPHYLAXIS: Non indicated      GI PROPHYLAXIS: IV PPI      Foley/Lines/Restrains: PIV      Disposition: Med/Surg      House officer team: Chilton Si team]      PCP: N/A      Specialist: Dr.Aziz      Pharmacy: [facility]  ATTESTATION:   I was NOT present with the resident during the entire interview and  examination of the patient. I personally interviewed the patient and repeated  the exam. I reviewed/revised the exam and discussed with the resident in  regards to assessment and plan as documented.   See resident's section for details and agree with the above documented note.   The patient states that he feels the same as yesterday. No abdominal pain,  fever, chills. On exam, the patient is alert, oriented, on room air. Abdomen  is soft, distended, nontender. This is a patient with C diff colitis,  alcoholic cirrhosis, ascites, esophageal varices. Patient remains stable. Will  continue current medication regimen. Awaiting rehab bed.  Electronically Signed On 07/26/22 12:40 EDT  _____________________________________________   Selena Batten MD, MOON SOO    Electronically Signed On 07/27/22 07:17 EDT  _____________________________________________   Skeet Simmer    Patient account number:  [id number]  Ordering physician:  ,    Other providers:  Meda Klinefelter, CATHERINE HORWITZ, KIMBERLY NKEMAKOLAM, Elie Leppo  Wylene Men,  ,  ,

## 2022-07-27 NOTE — Progress Notes (Addendum)
Progress Note EMR No Cosign  DATE/TIME NOTE CREATED: 07/27/2022 12:15:07  DATE/TIME PATIENT SEEN:   07/27/22  SUBJECTIVE:      Patient states that he feels well. Appetite is good. He denies any fever,  abdominal pain. Diarrhea is largely absent now. He denies any shortness of  breath or chest pain.  OBJECTIVE:   VITAL SIGNS:        Vital Signs:        Last Charted:        24 Hr Minimum:        24 Hr Maximum:        Temperature Oral        36.6        (10/06 07:29)        36.6        (10/06 07:29)        37.3        (10/05 19:40)        Heart Rate        58 (L)        (10/06 07:29)        58 (L)        (10/05 15:55)        66        (10/06 00:51)        Respiratory Rate        18        (10/06 07:29)        18        (10/05 15:55)        18        (10/05 15:55)        Systolic BP        101        (10/06 07:29)        101        (10/06 07:29)        120        (10/06 00:51)        Diastolic BP        62        (16/10 07:29)        62        (10/06 07:29)        67        (10/05 19:40)        Mean Arterial Pressure        75        (10/06 07:29)        75        (10/06 07:29)        84        (10/05 19:40)        SpO2/Pulse Oximetry        97        (10/06 07:29)        97        (10/06 07:29)        99        (10/05 15:55)   PHYSICAL EXAM:       General: No acute distress, alert, oriented x3, on room air       Cardio: RRR, no murmurs, gallops, rubs       Pulm: CTA, no wheezes, rhonchi, crackles       Abd: Soft, distended, nontender, +fluid wave       Extremities: no cyanosis, clubbing, or edema.  Pulses intact.   LAB RESULTS:  Hematology Basic - Last 36 hours (21)        Result        Date/Time        WBC        3.3 (L)         10/05 05:40        RBC        3.02 (L)         10/05 05:40        Hgb        10.6 (L)         10/05 05:40        Hct        30.7 (L)         10/05 05:40        MCV        101.7 (H)         10/05 05:40        MCH        35.1 (H)         10/05 05:40        MCHC        34.5         10/05  05:40        RDW        14.7 (H)         10/05 05:40        Platelet Count        110 (L)         10/05 05:40        NRBC Auto Abs        0.00         10/05 05:40        NRBC Auto Rel        0.0         10/05 05:40        Neutrophil Rel        52.0         10/05 05:40        Lymphocyte Rel        21.6         10/05 05:40        Monocyte Rel        21.0         10/05 05:40        Eosinophil Rel        2.7         10/05 05:40        Basophil Rel        1.2         10/05 05:40        Neutrophil Abs        1.71 (L)         10/05 05:40        Lymphocyte Abs        0.71 (L)         10/05 05:40        Monocyte Abs        0.69         10/05 05:40        Eosinophil Abs        0.09         10/05 05:40        Basophil Abs        0.04         10/05 05:40  Chemistry Comprehensive - Last 36 hours (15)        Result        Date/time        Sodium Lvl        129 (L)         10/05 05:40        Potassium Lvl        5.0         10/05 05:40        Chloride Lvl        100         10/05 05:40        CO2        21 (L)         10/05 05:40        Glucose Level        90         10/05 05:40        BUN        14         10/05 05:40        Creatinine Lvl        0.82         10/05 05:40        AGAP        8 (L)         10/05 05:40        Total Protein        5.0 (L)         10/05 05:40        Albumin Lvl        3.5         10/05 05:40        Calcium Lvl        8.0 (L)         10/05 05:40        Alk Phos        108         10/05 05:40        AST        74 (H)         10/05 05:40        ALT        36         10/05 05:40        Bili Total        1.0         10/05 05:40     RADIOLOGY/DIAGNOSTIC RESULTS:    Radiology - Last 36 hours (0)    No results in past 36 hours      Diagnostics - Non-Radiology (1)    ED EKG-CernerCV (09/22 06:40)    Please review the final report in the patient's chart.    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  Recurrent ascites with paracentesis weekely      #.  Hepatic cirrhosis      #.  Upper GI bleed secondary to severe esophagitis      #.  Hyponatremia      - GI consulted earlier in the course      -  Was in ICU due to fever and potential pressor needs      - Continue with propranolol 20mg  BID      - Bactrim BID for SBP prophylaxis      - Carafate 2000mg  PO TID for 14 days, last day is 10/07      - Continue omeprazole 40mg  PO BID indefinitely      - Continue multivitamin, thiamine, folic acid supplementation      - Continue aldactone 50mg  bedtime and lasix 20mg  daily      - Continue midodrine 5mg  daily      - Will monitor for repeat paracentesis needs      #.  C diff infection      - Recurrent C diff infection      - ID consulted, will continue with taper - PO vancomycin 125 QID day 7 of  10, then BID for 7 days, then daily for 7 days, then q other day for 14 days      - Continue precautions      DVT PPx: none due to bleeding      Full code      Disposition: Awaiting STR rehab bed, may need repeat paracentesis prior to  discharge if stay is prolonged  Electronically Signed On 07/27/22 12:23 EDT  _____________________________________________   Shari Heritage DO, Barbara Cower B    Patient account number:  [id number]  Ordering physician:  ,    Other providers:  JASON SON, CATHERINE HORWITZ, KIMBERLY NKEMAKOLAM, Amond Speranza  No PCP,  ,  ,

## 2022-07-28 NOTE — Progress Notes (Addendum)
Progress Note EMR No Cosign  DATE/TIME NOTE CREATED: 07/28/2022 13:21:24  DATE/TIME PATIENT SEEN:   07/28/22  SUBJECTIVE:      The patient denies any diarrhea, fever, shortness of breath. He states  that his abdomen feels a little tight, but he denies any pain. No nausea,  vomiting, shortness of breath, chest pain.  OBJECTIVE:   VITAL SIGNS:        Vital Signs:        Last Charted:        24 Hr Minimum:        24 Hr Maximum:        Temperature Oral        36.5        (10/07 08:01)        36.5        (10/07 08:01)        36.7        (10/06 19:43)        Heart Rate        58 (L)        (10/07 08:01)        58 (L)        (10/07 08:01)        66        (10/07 01:00)        Respiratory Rate        18        (10/07 08:01)        18        (10/06 19:43)        20        (10/07 01:00)        Systolic BP        112        (10/07 08:01)        96        (10/07 01:00)        112        (10/07 08:01)        Diastolic BP        70        (16/10 08:01)        56 (L)        (10/06 19:43)        70        (10/07 08:01)        Mean Arterial Pressure        84        (10/07 08:01)        73        (10/07 01:00)        84        (10/07 08:01)        SpO2/Pulse Oximetry        100        (10/07 08:01)        98        (10/06 19:43)        100        (10/07 08:01)   PHYSICAL EXAM:       General: No acute distress, alert, oriented x3, on room air       Cardio: RRR, no murmurs, gallops, rubs       Pulm: CTA, no wheezes, rhonchi, crackles       Abd: Soft, distended - slightly larger than yesterday, nontender, +fluid  wave       Extremities: no cyanosis, clubbing, or edema.  Pulses intact.   LAB RESULTS:        Hematology Basic - Last 36 hours (9)        Result        Date/Time        WBC        3.6 (L)         10/07 05:58        RBC        3.09 (L)         10/07 05:58        Hgb        10.8 (L)         10/07 05:58        Hct        32.8 (L)         10/07 05:58        MCV        106.1 (H)         10/07 05:58        MCH        35.0 (H)          10/07 05:58        MCHC        32.9         10/07 05:58        RDW        15.3 (H)         10/07 05:58        Platelet Count        123 (L)         10/07 05:58          Chemistry Comprehensive - Last 36 hours (9)        Result        Date/time        Sodium Lvl        131 (L)         10/07 05:58        Potassium Lvl        5.3 (H)         10/07 05:58        Chloride Lvl        100         10/07 05:58        CO2        22         10/07 05:58        Glucose Level        97         10/07 05:58        BUN        18         10/07 05:58        Creatinine Lvl        0.95         10/07 05:58        AGAP        9         10/07 05:58        Calcium Lvl        7.9 (L)         10/07 05:58     RADIOLOGY/DIAGNOSTIC RESULTS:    Radiology - Last 36 hours (0)    No results in past 36 hours      Diagnostics - Non-Radiology (1)    ED EKG-CernerCV (09/22 06:40)    Please review  the final report in the patient's chart.    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  Recurrent ascites with paracentesis weekely      #.  Hepatic cirrhosis      #.  Upper GI bleed secondary to severe esophagitis      #.  Hyponatremia      - GI consulted earlier in the course      - Was in ICU due to fever and potential pressor needs      - Continue with propranolol 20mg  BID      - Bactrim BID for SBP prophylaxis      - s/p carafate 2000mg  PO BID x 14 days      - Continue omeprazole 40mg  PO BID indefinitely      - Continue multivitamin, thiamine, folic acid supplementation      - Continue aldactone 50mg  bedtime and lasix 20mg  daily      - Continue midodrine 5mg  daily      - Abdomen is more distended, pt with more discomfort, no IR at this  hospital today, will contact [facility] for  potential transfer for this  procedure      #.  C diff infection      - Recurrent C diff infection      - ID consulted, will continue with taper - PO vancomycin 125 QID today is  8 of 10, then BID for 7 days, then daily for 7 days, then q other day for 14  days      - Continue precautions      DVT PPx: none due to bleeding      Full code      Disposition: Awaiting STR rehab bed, may need repeat paracentesis prior to  discharge if stay is prolonged  Electronically Signed On 07/28/22 13:24 EDT  _____________________________________________   Shari Heritage DO, Barbara Cower B    Patient account number:  [id number]  Ordering physician:  ,    Other providers:  JASON SON, CATHERINE HORWITZ, KIMBERLY NKEMAKOLAM, JOSHUA HUNDERT  No PCP,  ,  ,

## 2022-07-29 NOTE — Progress Notes (Addendum)
Progress Note EMR No Cosign  DATE/TIME NOTE CREATED: 07/29/2022 12:22:49  DATE/TIME PATIENT SEEN:   07/29/22  SUBJECTIVE:      The patient states that he feels well today. He denies any fever, chills,  chest pain. No abdominal pain, but states that it feels a little tight. No  nausea, eating well.  OBJECTIVE:   VITAL SIGNS:        Vital Signs:        Last Charted:        24 Hr Minimum:        24 Hr Maximum:        Temperature Oral        37.4 (H)        (10/08 00:00)        36.6        (10/07 16:57)        37.0        (10/07 21:00)        Heart Rate        70        (10/08 00:00)        59 (L)        (10/07 16:57)        70        (10/08 00:00)        Respiratory Rate        22 (H)        (10/08 00:00)        18        (10/07 21:00)        22 (H)        (10/08 00:00)        Systolic BP        120        (10/08 00:00)        99        (10/07 16:57)        120        (10/08 00:00)        Diastolic BP        65        (09/81 00:00)        63        (10/07 16:57)        67        (10/07 21:00)        Blood Pressure Location        Right        (10/08 00:00)          Blood Pressure Method        Automatic        (10/08 00:00)          Activity Supine Clin Doc        Bedrest        (10/08 00:00)          Activity Sitting Clin Doc        Post activ        (10/07 16:57)          SpO2/Pulse Oximetry        94        (10/08 00:00)        94        (10/08 00:00)        99        (10/07 16:57)   PHYSICAL EXAM:       General: No acute distress, alert, oriented  x3, on room air       Cardio: RRR, no murmurs, gallops, rubs       Pulm: CTA, no wheezes, rhonchi, crackles       Abd: Soft, distended - slightly larger than yesterday, nontender, +fluid  wave       Extremities: no cyanosis, clubbing, or edema.  Pulses intact.   LAB RESULTS:        Hematology Basic - Last 36 hours (9)        Result        Date/Time        WBC        3.6 (L)         10/07 05:58        RBC        3.09 (L)         10/07 05:58        Hgb        10.8 (L)          10/07 05:58        Hct        32.8 (L)         10/07 05:58        MCV        106.1 (H)         10/07 05:58        MCH        35.0 (H)         10/07 05:58        MCHC        32.9         10/07 05:58        RDW        15.3 (H)         10/07 05:58        Platelet Count        123 (L)         10/07 05:58          Chemistry Comprehensive - Last 36 hours (9)        Result        Date/time        Sodium Lvl        128 (L)         10/08 07:03        Potassium Lvl        4.7         10/08 07:03        Chloride Lvl        97         10/08 07:03        CO2        20 (L)         10/08 07:03        Glucose Level        123 (H)         10/08 07:03        BUN        17         10/08 07:03        Creatinine Lvl        0.86         10/08 07:03        AGAP        11         10/08 07:03        Calcium Lvl  8.2 (L)         10/08 07:03     RADIOLOGY/DIAGNOSTIC RESULTS:    Radiology - Last 36 hours (0)    No results in past 36 hours      Diagnostics - Non-Radiology (1)    ED EKG-CernerCV (09/22 06:40)    Please review the final report in the patient's chart.    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  Recurrent ascites with paracentesis weekely      #.  Hepatic cirrhosis      #.  Upper GI bleed secondary to severe esophagitis      #.  Hyponatremia      - GI consulted earlier in the course      - Was in ICU due to fever and potential pressor needs      - Continue with propranolol 20mg  BID      - Bactrim BID for SBP prophylaxis      - s/p carafate 2000mg  PO BID x 14 days      - Continue omeprazole 40mg  PO BID indefinitely      - Continue multivitamin, thiamine, folic acid supplementation      - Continue aldactone 50mg  bedtime and lasix 20mg  daily      - Continue midodrine 5mg  daily      - Patient would prefer to stay here for paracentesis,  will contact IR on  Tuesday for this as they are in house, no immediate indication for  paracentesis currently      #.  C diff infection      - Recurrent C diff infection      - ID consulted, will continue with taper - PO vancomycin 125 QID today is  9 of 10, then BID for 7 days, then daily for 7 days, then q other day for 14  days      - Continue precautions      DVT PPx: none due to bleeding      Full code      Disposition: Awaiting STR rehab bed, may need repeat paracentesis prior to  discharge if stay is prolonged  Electronically Signed On 07/29/22 13:23 EDT  _____________________________________________   Shari Heritage DO, Barbara Cower B    Patient account number:  [id number]  Ordering physician:  ,    Other providers:  JASON SON, CATHERINE HORWITZ, KIMBERLY NKEMAKOLAM, Jaila Schellhorn  No PCP,  ,  ,

## 2022-07-30 NOTE — Progress Notes (Addendum)
Progress Note EMR No Cosign  DATE/TIME NOTE CREATED: 07/30/2022 12:25:44  DATE/TIME PATIENT SEEN:   07/30/22  SUBJECTIVE:      The patient states that he feels well. Abdomen is getting a little more  distended. He denies any fever, chills. He is eating well. No chest pain,  shortness of breath.  OBJECTIVE:   VITAL SIGNS:        Vital Signs:        Last Charted:        24 Hr Minimum:        24 Hr Maximum:        Temperature Oral        36.3        (10/09 07:15)        36.3        (10/09 07:15)        36.6        (10/08 16:27)        Heart Rate        57 (L)        (10/09 07:15)        55 (L)        (10/08 16:27)        57 (L)        (10/09 07:15)        Respiratory Rate        18        (10/09 07:15)        18        (10/09 07:15)        21 (H)        (10/08 22:32)        Systolic BP        133        (10/09 07:15)        104        (10/09 00:28)        133        (10/09 07:15)        Diastolic BP        80        (65/78 07:15)        65        (10/08 16:27)        87        (10/08 22:32)        Mean Arterial Pressure        98        (10/09 07:15)        78        (10/09 00:28)        98        (10/09 07:15)        Blood Pressure Location        Left, Uppe        (10/09 07:15)          Activity Supine Clin Doc        Bedrest        (10/09 07:15)          SpO2/Pulse Oximetry        98        (10/09 07:15)        96        (10/08 22:32)        100        (10/09 00:28)        Oxygen Percent  98        (10/08 22:32)        97        (10/08 16:27)        98        (10/08 22:32)   PHYSICAL EXAM:       General: No acute distress, alert, oriented x3, on room air       Cardio: RRR, no murmurs, gallops, rubs       Pulm: CTA, no wheezes, rhonchi, crackles       Abd: Soft, distended - about the same as yesterday, nontender, +fluid  wave       Extremities: no cyanosis, clubbing, or edema.  Pulses intact.   LAB RESULTS:        Hematology Basic - Last 36 hours (0)        Result        Date/Time          Chemistry  Comprehensive - Last 36 hours (9)        Result        Date/time        Sodium Lvl        128 (L)         10/08 07:03        Potassium Lvl        4.7         10/08 07:03        Chloride Lvl        97         10/08 07:03        CO2        20 (L)         10/08 07:03        Glucose Level        123 (H)         10/08 07:03        BUN        17         10/08 07:03        Creatinine Lvl        0.86         10/08 07:03        AGAP        11         10/08 07:03        Calcium Lvl        8.2 (L)         10/08 07:03     RADIOLOGY/DIAGNOSTIC RESULTS:    Radiology - Last 36 hours (0)    No results in past 36 hours      Diagnostics - Non-Radiology (1)    ED EKG-CernerCV (09/22 06:40)    Please review the final report in the patient's chart.    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  Recurrent ascites with paracentesis weekely      #.  Hepatic cirrhosis      #.  Upper GI bleed secondary to severe esophagitis      #.  Hyponatremia      - GI consulted earlier in the  course      - Was in ICU due to fever and potential pressor needs      - Continue with propranolol 20mg  BID      - Bactrim BID for SBP prophylaxis      - s/p carafate 2000mg  PO BID x 14 days      - Continue omeprazole 40mg  PO BID indefinitely      - Continue multivitamin, thiamine, folic acid supplementation      - Continue aldactone 50mg  bedtime and lasix 20mg  daily      - Continue midodrine 5mg  daily      - Patient would prefer to stay here for paracentesis, will contact IR on  Tuesday for this as they are in house, no immediate indication for  paracentesis currently      #.  C diff infection      - Recurrent C diff infection      - ID consulted, will continue with taper - PO vancomycin 125 QID today is  10 of 10, then BID for 7 days, then daily for 7 days, then q other day  for 14  days      - Continue precautions      DVT PPx: none due to bleeding      Full code      Disposition: Awaiting STR rehab bed, will need paracentesis on Tuesday  Electronically Signed On 07/30/22 12:27 EDT  _____________________________________________   Shari Heritage DO, Barbara Cower B    Patient account number:  [id number]  Ordering physician:  ,    Other providers:  JASON SON, CATHERINE HORWITZ, KIMBERLY NKEMAKOLAM, JOSHUA HUNDERT  No PCP,  ,  ,

## 2022-07-31 NOTE — Progress Notes (Addendum)
Progress Note EMR No Cosign  DATE/TIME NOTE CREATED: 07/31/2022 11:54:52  DATE/TIME PATIENT SEEN:   07/31/22  SUBJECTIVE:      Patient states that his abdomen is more distended today. He denies any  fever, stools have been loose but getting more formed. Having about 2 bowel  movements a day. Eating and drinking well.  OBJECTIVE:   VITAL SIGNS:        Vital Signs:        Last Charted:        24 Hr Minimum:        24 Hr Maximum:        Temperature Oral        36.2        (10/10 07:00)        36.2        (10/10 07:00)        36.7        (10/09 15:20)        Heart Rate        59 (L)        (10/10 07:00)        59 (L)        (10/10 07:00)        64        (10/09 19:30)        Respiratory Rate        20        (10/10 07:00)        16        (10/09 19:30)        20        (10/10 07:00)        Systolic BP        116        (10/10 07:00)        92        (10/09 19:30)        116        (10/10 07:00)        Diastolic BP        72        (16/10 07:00)        57 (L)        (10/09 19:30)        72        (10/09 15:20)        Mean Arterial Pressure        87        (10/10 07:00)        69        (10/09 19:30)        87        (10/10 07:00)        Blood Pressure Method        Automatic        (10/10 00:00)          SpO2/Pulse Oximetry        98        (10/10 00:00)        98        (10/09 19:30)        99        (10/09 15:20)        Oxygen Percent        100        (10/10 07:00)        100        (10/10 07:00)  100        (10/10 07:00)   PHYSICAL EXAM:       General: No acute distress, alert, oriented x3, on room air       Cardio: RRR, no murmurs, gallops, rubs       Pulm: CTA, no wheezes, rhonchi, crackles       Abd: Soft, distended - about the same as yesterday, nontender, +fluid  wave       Extremities: no cyanosis, clubbing, or edema.  Pulses intact.   LAB RESULTS:        Hematology Basic - Last 36 hours (9)        Result        Date/Time        WBC        3.6 (L)         10/10 06:07        RBC        2.91 (L)          10/10 06:07        Hgb        10.2 (L)         10/10 06:07        Hct        30.8 (L)         10/10 06:07        MCV        105.8 (H)         10/10 06:07        MCH        35.1 (H)         10/10 06:07        MCHC        33.1         10/10 06:07        RDW        15.3 (H)         10/10 06:07        Platelet Count        103 (L)         10/10 06:07          Chemistry Comprehensive - Last 36 hours (9)        Result        Date/time        Sodium Lvl        126 (L)         10/09 13:32        Potassium Lvl        4.8         10/09 13:32        Chloride Lvl        98         10/09 13:32        CO2        17 (L)         10/09 13:32        Glucose Level        146 (H)         10/09 13:32        BUN        17         10/09 13:32        Creatinine Lvl        0.88         10/09 13:32        AGAP  11         10/09 13:32        Calcium Lvl        8.0 (L)         10/09 13:32     RADIOLOGY/DIAGNOSTIC RESULTS:    Radiology - Last 36 hours (0)    No results in past 36 hours      Diagnostics - Non-Radiology (1)    ED EKG-CernerCV (09/22 06:40)    Please review the final report in the patient's chart.    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 difficile  infection, 07/30/2022 21:00:00 EDT, Stop date 08/06/2022 23:00:00 EDT         IR Abd Paracentesis W Image Guidance      #.  Recurrent ascites with paracentesis weekely      #.  Hepatic cirrhosis      #.  Upper GI bleed secondary to severe esophagitis      #.  Hyponatremia      - GI consulted earlier in the course      - Was in ICU due to fever and potential pressor needs      - Continue with propranolol 20mg  BID      - Bactrim BID for SBP prophylaxis      - s/p carafate 2000mg  PO BID x 14 days      - Continue  omeprazole 40mg  PO BID indefinitely      - Continue multivitamin, thiamine, folic acid supplementation      - Continue aldactone 50mg  bedtime and lasix 20mg  daily      - Continue midodrine 5mg  daily      - Contacted IR today, he will go for paracentesis      #.  C diff infection      - Recurrent C diff infection      - ID consulted, will continue with taper - s/p PO vancomycin 125 QID, now  on BID for 7 days (today is day 1 of 7), then daily for 7 days, then q other  day for 14 days      - Continue precautions      DVT PPx: none due to bleeding      Full code      Disposition: Awaiting STR rehab bed, paracentesis today  Electronically Signed On 07/31/22 11:57 EDT  _____________________________________________   Shari Heritage DO, Barbara Cower B    Patient account number:  [id number]  Ordering physician:  ,    Other providers:  JASON SON, CATHERINE HORWITZ, KIMBERLY NKEMAKOLAM, JOSHUA HUNDERT  No PCP,  ,  ,

## 2022-08-01 NOTE — Progress Notes (Addendum)
Progress Note EMR No Cosign SOAP  DATE/TIME NOTE CREATED: 08/01/2022 17:02:24  SUBJECTIVE:      Paracentesis was planned for today but has been deferred to tomorrow.  OBJECTIVE:   VITAL SIGNS:        Vital Signs:        Last Charted:        24 Hr Minimum:        24 Hr Maximum:        Temperature Oral        36.7        (10/11 16:01)        36.7        (10/11 16:01)        37.1        (10/10 20:01)        Heart Rate        60        (10/11 16:01)        56 (L)        (10/11 08:42)        67        (10/10 20:01)        Respiratory Rate        18        (10/11 16:01)        16        (10/10 20:01)        20        (10/10 18:41)        Systolic BP        113        (10/11 16:01)        111        (10/10 20:01)        113        (10/11 16:01)        Diastolic BP        67        (78/29 16:01)        62        (10/11 08:42)        76        (10/11 00:45)        SpO2/Pulse Oximetry        98        (10/11 16:01)        98        (10/11 16:01)        100        (10/11 08:42)        Oxygen Flow        2        (10/10 20:01)        2        (10/10 18:41)        2        (10/10 18:41)   PHYSICAL EXAM:       GENERAL: No acute distress, non-toxic appearing.       HEART: Regular rate and rhythm. Normal S1 and S2, without murmurs, rub or  gallop.       LUNGS: Clear breath sounds bilaterally.  No wheezes, rales, or rhonchi.       VASC: No edema. Peripheral pulses normal and equal in all extremities.       ABD: Bowel sounds normal, soft, significantly distended abdomen.       EXT: Normal range of motion       Skin: No skin  rashes       NEURO: Alert and oriented x 3.   LAB RESULTS:        Hematology Basic - Last 36 hours (9)        Result        Date/Time        WBC        3.6 (L)         10/10 06:07        RBC        2.91 (L)         10/10 06:07        Hgb        10.2 (L)         10/10 06:07        Hct        30.8 (L)         10/10 06:07        MCV        105.8 (H)         10/10 06:07        MCH        35.1 (H)         10/10 06:07         MCHC        33.1         10/10 06:07        RDW        15.3 (H)         10/10 06:07        Platelet Count        103 (L)         10/10 06:07          Chemistry Comprehensive - Last 36 hours (9)        Result        Date/time        Sodium Lvl        131 (L)         10/11 05:47        Potassium Lvl        5.0         10/11 05:47        Chloride Lvl        100         10/11 05:47        CO2        22         10/11 05:47        Glucose Level        96         10/11 05:47        BUN        16         10/11 05:47        Creatinine Lvl        0.90         10/11 05:47        AGAP        9         10/11 05:47        Calcium Lvl        8.4 (L)         10/11 05:47     RADIOLOGY/DIAGNOSTIC RESULTS:    Radiology - Last 36 hours (0)    No results in past 36 hours      Diagnostics - Non-Radiology (1)  ED EKG-CernerCV (09/22 06:40)    Please review the final report in the patient's chart.    ASSESSMENT/PLAN:      66 yo F with PMH of Cirrhosis, Hepatitis C, Alcohol Abuse Disorder,  Recurrent C Difficile Infections, Spinal Stenosis was admitted for Upper GI  Bleed and Decompensated Cirrhosis.      #Decompensated Cirrhosis      #Large Ascites      -Continue with propranolol 20mg  BID      -Continue  Bactrim BID for SBP prophylaxis      -Continue Aldactone 50mg  bedtime and Lasix 20mg  daily      -Continue midodrine 5mg  daily      -Continue multivitamin, thiamine, folic acid supplementation      -Was planned for paracentesis today but this has been deferred to  tomorrow.      #Upper GI Bleed 2/2 Severe Esophagitis      -s/p Sucralfate 2000mg  PO BID x 14 days (completed on 07/28/2022)      -Continue omeprazole 40mg  PO BID indefinitely      #C Difficile Infection      - History of Recurrent C diff infection      - ID consulted, will continue with taper - s/p PO vancomycin 125 QID, now  on BID for 7 days (today is day 2 of 7), then daily for 7 days, then q other  day for 14 days      - Continue precautions      #Diet: Regular Diet       #DVT Prophylaxis: Mechanical VTE Prophylaxis      #Ethics: Full Code      #Disposition: Pending Paracentesis tomorrow.      I confirmed that the patient's advance care planning is present, CODE  STATUS is documented, her surrogate decision maker is listed in the patient's  medical record.  I have utilized all available immediate resources to obtain,  update, or review the patient's current medications.  Electronically Signed On 08/01/22 17:02 EDT  _____________________________________________   Naoma Diener MD, TRAVIS    Patient account number:  [id number]  Ordering physician:  ,    Other providers:  Meda Klinefelter, CATHERINE HORWITZ, KIMBERLY NKEMAKOLAM, JOSHUA HUNDERT  No PCP,  ,  ,

## 2022-08-02 NOTE — Progress Notes (Addendum)
Progress Note EMR No Cosign SOAP  DATE/TIME NOTE CREATED: 08/02/2022 13:49:35  SUBJECTIVE:      Patient had a paracentesis today.  OBJECTIVE:   VITAL SIGNS:        Vital Signs:        Last Charted:        24 Hr Minimum:        24 Hr Maximum:        Temperature Oral        36.4        (10/12 07:00)        36.4        (10/12 07:00)        36.7        (10/11 16:01)        Heart Rate        59 (L)        (10/12 07:00)        59 (L)        (10/12 07:00)        70        (10/12 05:29)        Pulse/HR Method        Monitor        (10/12 05:29)          Respiratory Rate        20        (10/12 07:00)        16        (10/11 20:16)        20        (10/12 07:00)        Systolic BP        116        (10/12 07:00)        108        (10/11 20:16)        116        (10/12 07:00)        Diastolic BP        68        (29/56 07:00)        55 (L)        (10/11 20:16)        71        (10/12 00:54)        Mean Arterial Pressure        74        (10/12 05:29)        73        (10/11 20:16)        84        (10/12 00:54)        Blood Pressure Method        Automatic        (10/12 05:29)          Site Change        Yes        (10/12 05:29)          SpO2/Pulse Oximetry        100        (10/12 07:00)        98        (10/11 16:01)        100        (10/12 05:29)   PHYSICAL EXAM:       GENERAL: No acute distress, non-toxic appearing.       HEART:  Regular rate and rhythm. Normal S1 and S2, without murmurs, rub or  gallop.       LUNGS: Clear breath sounds bilaterally.  No wheezes, rales, or rhonchi.       VASC: No edema. Peripheral pulses normal and equal in all extremities.       ABD: Bowel sounds normal, soft, significantly distended abdomen.       EXT: Normal range of motion       Skin: No skin rashes       NEURO: Alert and oriented x 3.   LAB RESULTS:        Hematology Basic - Last 36 hours (9)        Result        Date/Time        WBC        3.4 (L)         10/12 05:51        RBC        2.99 (L)         10/12 05:51        Hgb         10.6 (L)         10/12 05:51        Hct        31.1 (L)         10/12 05:51        MCV        104.0 (H)         10/12 05:51        MCH        35.5 (H)         10/12 05:51        MCHC        34.1         10/12 05:51        RDW        15.3 (H)         10/12 05:51        Platelet Count        111 (L)         10/12 05:51          Chemistry Comprehensive - Last 36 hours (15)        Result        Date/time        Sodium Lvl        128 (L)         10/12 05:51        Potassium Lvl        4.8         10/12 05:51        Chloride Lvl        98         10/12 05:51        CO2        22         10/12 05:51        Glucose Level        101         10/12 05:51        BUN        17         10/12 05:51        Creatinine Lvl        0.93         10/12 05:51  AGAP        8 (L)         10/12 05:51        Total Protein        5.2 (L)         10/12 05:51        Albumin Lvl        3.2 (L)         10/12 05:51        Calcium Lvl        7.7 (L)         10/12 05:51        Alk Phos        115         10/12 05:51        AST        57 (H)         10/12 05:51        ALT        32         10/12 05:51        Bili Total        1.1 (H)         10/12 05:51     RADIOLOGY/DIAGNOSTIC RESULTS:    Radiology - Last 36 hours (1)    [place] Abd Paracentesis W Image Guidance (10/12 12:48)    FINDINGS: Ultrasound demonstrates a large amount of ascites. Uncomplicated  therapeutic paracentesis.    IMPRESSION:    SUCCESSFUL ULTRASOUND GUIDED PARACENTESIS.    READING SITE:  [facility]      Diagnostics - Non-Radiology (1)    ED EKG-CernerCV (09/22 06:40)    Please review the final report in the patient's chart.    ASSESSMENT/PLAN:      66 yo F with PMH of Cirrhosis, Hepatitis C, Alcohol Abuse Disorder,  Recurrent C Difficile Infections, Spinal Stenosis was admitted for Upper GI  Bleed and Decompensated Cirrhosis.      #Decompensated Cirrhosis      #Large Ascites      -Continue with propranolol 20mg  BID      -Continue  Bactrim BID for SBP prophylaxis      -Continue Aldactone  50mg  bedtime and Lasix 20mg  daily      -Continue midodrine 5mg  daily      -Continue multivitamin, thiamine, folic acid supplementation      -s/p Paracentesis on 08/02/2022      #Upper GI Bleed 2/2 Severe Esophagitis      -s/p Sucralfate 2000mg  PO BID x 14 days (completed on 07/28/2022)      -Continue omeprazole 40mg  PO BID indefinitely      #C Difficile Infection      - History of Recurrent C diff infection      - ID consulted, will continue with taper - s/p PO vancomycin 125 QID, now  on BID for 7 days (today is day 3 of 7), then daily for 7 days, then q other  day for 14 days      - Continue precautions      #Diet: Regular Diet      #DVT Prophylaxis: Mechanical VTE Prophylaxis      #Ethics: Full Code      #Disposition: Pending placement for STR.      I confirmed that the patient's advance care planning is present, CODE  STATUS is documented, her surrogate decision maker is listed in the patient's  medical record.  I have utilized all available  immediate resources to obtain,  update, or review the patient's current medications.  Electronically Signed On 08/02/22 13:50 EDT  _____________________________________________   Naoma Diener MD, TRAVIS    Patient account number:  [id number]  Ordering physician:  ,    Other providers:  Meda Klinefelter, CATHERINE HORWITZ, KIMBERLY NKEMAKOLAM, JOSHUA HUNDERT  No PCP,  ,  ,

## 2022-08-03 NOTE — Discharge Summary (Addendum)
Discharge Summary EMR No Cosign  DATE/TIME NOTE CREATED: 08/03/2022 12:47:25  HOSPITAL COURSE:       Admission History and Hospital Course:       For the details of the history of present illness, past medical history,  family history, social history, please refer to History & Physical by Dr.  Ellison Allison on behalf of Dr. Orvan Allison.       In summary, this is a 66 year old male with a past medical history of  untreated hepatitis C and history of alcohol use, decompensated hepatic  cirrhosis complicated by portal hypertension with recurrent abdominal ascites  requiring weekly paracentesis, recurrent C-difficile (two episodes in 2022),  hypertension and spinal stenosis (with associated neuropathy) who was admitted  to the medical floors on 9/22 for hematemesis.       He presented to the ED after x3 episodes of hematemesis occurred after a  fall from bed. He was hemodynamically stable, HH 11.8/32.5. He was also found  to have significant ascites and diffuse abdominal pain and noted he had missed  his paracentesis appointment a week prior. He underwent an US guided  paracentesis (07/13/2022) which removed 13 L of clear straw colored fluid,  following that he was given 50g of albumin. His abdominal discomfort improved  following paracentesis. Ascitic fluid studies were consistent with transudate,  did not show any evidence of spontaneous bacterial peritonitis (WCC 59).  Second paracentesis before transfer to the ICU performed on 9/25 with 12  liters of straw-colored ascitic fluid  removed. His hemoglobin has been stable  throughout admission. He had an endoscopy 9/23 which showed severe grade D  esophagitis near his proximal esophagus with diffuse ulceration and no  evidence of active bleeding. 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 Small intestine biopsies were  taken. He was started on ceftriaxone 1 g daily for SBP prophylaxis. Given his  esophageal varices, he received  3 days of octreotide, discontinued 9/24 as the  bleeding was unlikely due to the varices. A duplex ultrasound of his abdomen  did not show any evidence of portal venous thrombosis or HCC. His liver  transaminases are deranged, so we sent studies for ANA, anti smooth muscle Ab,  anti mitochondrial Ab, immunoglobin G, AFP and iron studies. Iron and TIBC  were low at 45 and 127 respectively while other iron studies were within  normal limits. HBsAb was reactive. All other serology were found to be within  normal limits (negative). There has been no recurrence of hematemesis  throughout the hospital stay and his Hgb has been stable ranging from 9 to 11.  Because he did not have bleeding after 5 days, low dose nadolol 20mg  daily for  esophageal varices was started on 9/27. MELD-Na Score (calculated 09/28): was  18 points (3-4% 90-Day Mortality).       On 9/28 the patient was transferred to the ICU for fever and hypotension  with potential need for pressor support. Nadolol, lasix and spironolactone  were stopped. The fever and shock were initially suspected to be caused by  SBP. Therefore, ceftriaxone was stopped  (5 days of treatment completed), and  the patient was given 2 doses of Zosyn. At the same time, the patient was  having many episodes of diarrhea and a C.diff PCR sent was found to be  positive. The patient was diagnosed with hypotension due to hypovolemia due to  persistent diarrhea in the setting of C.difficile infection, likely associated  with  his recent course of ceftriaxone. Zosyn was stopped and vancomycin  po  was initiated on 9/28 for recurrent C.difficile infection.Contributing factor  might have also been large volume paracentesis volume shifts.  Patient  underwent two therapeutic paracenteses while in the ICU with fluid removal of  4.2L and 9.7L removed respectively. Last paracentesis on 07/23/2022. Bactrim  was started for SBP prophylaxis on 9/30 with guidance from ID and GI teams.  From a renal  standpoint patient had a mild creatinine elevation compared to  baseline (0.2 increase), and the nephrology team was consulted. They do not  believe the patient has hepatorenal syndrome. Patient was started on midodrine  PO TID for blood pressure support. It has been decreased to 5 mg po BID now.  Nephrology recommended giving the patient albumin 25g Q8H for 6 doses,  completed on 10/2. Patient blood pressure has remained stable and diarrhea is  improving. Discussion about fecal transplant for C diff second recurrence was  discussed with ID and GI, but currently not available in the hospital.  Propranolol 20 mg po BID was started on 10/2 with good tolerance. Also,  furosemide 20 mg daily and spironolactone 50 mg daily have been resumed on  07/24/2022.       Mr Garrett Allison has not had any further nausea or vomiting since admission.  His hemoglobin has remained stable. He continues to have severe heartburn for  which he should continue high dose proton-pump inhibitor indefinitely and  Carafate completed 14 days. He will continue on a slow vancomycin taper for  his recurrent C.difficile infection. Taper will be completed on 08/27/2022.       The time of discharge the patient is hemodynamically stable, and is being  discharged to STR with instructions to follow up with his Primary Care  Physician and Gastroenterologist.   CONSULTING SERVICES:    Gastroenterologist  PHYSICAL EXAM:   VITAL SIGNS:        Vital Signs:        Last Charted:        24 Hr Minimum:        24 Hr Maximum:        Temperature Oral        36.7        (10/13 08:08)        36.7        (10/13 08:08)        36.5        (10/12 15:42)        Heart Rate        65        (10/13 08:08)        61        (10/12 15:42)        65        (10/13 08:08)        Pulse/HR Method        Monitor        (10/12 19:36)          Respiratory Rate        18        (10/13 08:08)        16        (10/12 19:36)        20        (10/12 15:42)        Systolic BP        103         (10/13 08:08)  84 (L)        (10/12 15:42)        112        (10/13 01:00)        Diastolic BP        60        (16/10 08:08)        50 (L)        (10/12 15:42)        68        (10/13 01:00)        Mean Arterial Pressure        83        (10/13 01:00)        61        (10/12 15:42)        83        (10/13 01:00)        Blood Pressure Method        Automatic        (10/12 19:36)          SpO2/Pulse Oximetry        94        (10/13 08:08)        94        (10/13 08:08)        99        (10/12 19:36)      GENERAL: No acute distress, non-toxic appearing.      HEART: Regular rate and rhythm. Normal S1 and S2, without murmurs, rub or  gallop.      LUNGS: Clear breath sounds bilaterally.  No wheezes, rales, or rhonchi.      VASC: No edema. Peripheral pulses normal and equal in all extremities.      ABD: Bowel sounds normal, soft, abdominal distention, anterior abdominal  wall hernia.      GU: Normal      EXT: Normal range of motion, no joint swelling, no clubbing, no cyanosis.      Skin: No skin rashes      NEURO: Alert and oriented x 3.  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 Then take 1 tab daily until 08/13/2022. Then take 1 tab every  48hrs until 08/27/2022., # 21 tab, 0 Refill(s), other reason (Rx)          Discharge Patient   PATIENT DISCHARGE CONDITION:    Stable   Discharge Disposition:    Discharge to STR   DATE OF DISCHARGE:     08/03/2022  DISCHARGE MEDICATIONS:         Discharge Medications (14)       Order Details       Status       New / Continued Meds: (13)         furosemide 40 mg oral tablet        , TAKE 1 TABLET BY MOUTH TWICE DAILY       Continue       gabapentin 300 mg oral capsule       300 mg = 1 cap, Oral, TID       Continue       multivitamin with minerals       1 tab, Oral, Daily       Continue       thiamine 100 mg oral tablet       100 mg = 1 tab, Oral, Daily       Continue       folic acid 1 mg oral tablet        , TAKE 1 TABLET BY MOUTH EVERY DAY       Continue with Changes       pantoprazole 40 mg oral enteric coated        , TAKE 1 TABLET BY MOUTH DAILY       Continue with Changes       tablet         propranolol 20 mg oral tablet        , TAKE 1 TABLET BY MOUTH TWICE DAILY       Continue with Changes       rosuvastatin 5 mg oral tablet        , TAKE 1 TABLET BY MOUTH DAILY       Continue with Changes       spironolactone 50 mg oral tablet        , TAKE 1 TABLET BY MOUTH DAILY       Continue with Changes       midodrine 5 mg oral tablet       5 mg = 1 tab, Oral, Daily       New       omeprazole 40 mg oral delayed release       40 mg = 1 cap, Oral, BID AC       New       capsule         sulfamethoxazole-trimethoprim 800 mg-       1 tab, Oral, BID       New       160 mg oral tablet         vancomycin 125 mg oral capsule        , Take 1 tab twice daily for until       New         08/07/2022.         Then take 1 tab daily until 08/13/2022.         Then take 1 tab every 48hrs until         08/27/2022.         Stopped Meds: (1)         lactulose 10 g/15 mL oral syrup         Stop    INSTRUCTIONS:       Order Name       Order Details       Discharge Patient  Discharge To: Inpatient Rehab       Discharge Diet: Cardiac Diet       Discharge Activity: Up Ad Lib  FOLLOW UP:      With When Contact Information          AZIZ MD, KHALID Within 10-14 days [address] [place], [place]  [address]- [phone]          Additional  Instructions: Please call to schedule a follow up  appointment with your Gastroenterologist for within 1-2 weeks of being  discharged form the hospital. Please also discuss scheduling of future  paracenteses with your Gastroenterologist.          Follow up with primary care provider Within 7-10 days          Additional Instructions: Please call to schedule a follow up  appointment with your Primary Care Physician for within 1-2 weeks of being  discharged from the hospital.          [facility] Phone:  [phone] [address] [place], [place] [address] Within As scheduled              Additional Instructions:  Electronically Signed On 08/03/22 12:49 EDT  _____________________________________________   Naoma Diener MD, TRAVIS    Patient account number:  [id number]  Ordering physician:  ,    Other providers:  Meda Klinefelter, CATHERINE HORWITZ, KIMBERLY NKEMAKOLAM, JOSHUA HUNDERT  No PCP,  ,  ,

## 2022-08-17 NOTE — Procedures (Signed)
Procedure Note EMR No Cosign  PARACENTESIS NOTE  Risks and benefits of the procedure were discussed with the patient. Written  consent was also obtained.  Patient was in a semi upright position 30 degrees.  Appropriate site in the right lower quadrant was marked under nonsterile  ultrasound guidance. Sterile ultrasound guidance was not needed.  Skin was  cleaned with chlorhexidine which was included in the paracentesis kit.  Patient was draped in standard condition with drape circular at the site of  marking and additional sterile draping which extended the sterile field.  Skin  and paracentesis track was anesthetized with 1% lidocaine 8 ml.  Small  incision was made with a scalpel and 5 French paracentesis catheter was  inserted under negative pressure until ascitic fluid was withdrawn with no  blood noted.  Catheter was advanced without further advancing of the internal  needle, and the needle was then removed leaving the catheter only.  The  catheter was connected to negative pressure collection bottles.  Approximately  11.5 L of ascitic fluid were removed which appeared yellow in appearance.  There were no complications and minimal blood loss 1 ml.  Patient tolerated  procedure well.  Therapeutic paracentesis. No samples were sent. Albumin IV ordered   Brantley Stage, MD  PGY-1 under the supervision of Heriberto Antigua, MD PGY-3  Electronically Signed On 08/17/22 23:04 EDT  _____________________________________________   Lajuana Matte MD, ALEXANDRA    Electronically Signed On 09/17/22 18:44 EST  _____________________________________________   Marijo Conception MD, Elissa Hefty    Patient account number:  [id number]  Ordering physician:  ,    Other providers:  Meda Klinefelter, Deliah Goody, WOLF WANG, ANDREW GORDON  Wylene Men,  ,  ,

## 2022-09-21 DEATH — deceased
